# Patient Record
Sex: Female | Born: 1990
Health system: Southern US, Community
[De-identification: ages and names within clinical notes are randomized; demographics above are authoritative.]

## PROBLEM LIST (undated history)

## (undated) DIAGNOSIS — K589 Irritable bowel syndrome without diarrhea: Secondary | ICD-10-CM

## (undated) DIAGNOSIS — D696 Thrombocytopenia, unspecified: Secondary | ICD-10-CM

## (undated) DIAGNOSIS — R221 Localized swelling, mass and lump, neck: Secondary | ICD-10-CM

## (undated) DIAGNOSIS — R0602 Shortness of breath: Secondary | ICD-10-CM

## (undated) DIAGNOSIS — R400 Somnolence: Secondary | ICD-10-CM

## (undated) DIAGNOSIS — M2550 Pain in unspecified joint: Secondary | ICD-10-CM

## (undated) DIAGNOSIS — R569 Unspecified convulsions: Secondary | ICD-10-CM

## (undated) DIAGNOSIS — N912 Amenorrhea, unspecified: Secondary | ICD-10-CM

## (undated) DIAGNOSIS — G40909 Epilepsy, unspecified, not intractable, without status epilepticus: Secondary | ICD-10-CM

## (undated) HISTORY — DX: Localized swelling, mass and lump, neck: R22.1

## (undated) HISTORY — DX: Irritable bowel syndrome, unspecified: K58.9

## (undated) HISTORY — DX: Somnolence: R40.0

## (undated) HISTORY — DX: Pain in unspecified joint: M25.50

## (undated) HISTORY — PX: OTHER SURGICAL HISTORY: SHX169

## (undated) HISTORY — DX: Shortness of breath: R06.02

## (undated) HISTORY — DX: Epilepsy, unspecified, not intractable, without status epilepticus: G40.909

## (undated) HISTORY — DX: Thrombocytopenia, unspecified: D69.6

## (undated) HISTORY — DX: Amenorrhea, unspecified: N91.2

---
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
## 2010-06-06 ENCOUNTER — Emergency Department (HOSPITAL_COMMUNITY)
Admission: EM | Admit: 2010-06-06 | Discharge: 2010-06-06 | Payer: Self-pay | Source: Home / Self Care | Admitting: Emergency Medicine

## 2010-06-08 LAB — COMPREHENSIVE METABOLIC PANEL
Albumin: 3.8 g/dL (ref 3.5–5.2)
BUN: 12 mg/dL (ref 6–23)
CO2: 24 mEq/L (ref 19–32)
Chloride: 110 mEq/L (ref 96–112)
Creatinine, Ser: 0.65 mg/dL (ref 0.4–1.2)
GFR calc non Af Amer: >60 mL/min (ref >60)
Total Bilirubin: 0.5 mg/dL (ref 0.3–1.2)

## 2010-06-08 LAB — DIFFERENTIAL
Basophils Absolute: 0 10*3/uL (ref 0.0–0.1)
Lymphocytes Relative: 13 % (ref 12–46)
Monocytes Relative: 13 % — ABNORMAL HIGH (ref 3–12)
Neutrophils Relative %: 74 % (ref 43–77)

## 2010-06-08 LAB — CBC
Hemoglobin: 13 g/dL (ref 12.0–15.0)
RBC: 4.28 MIL/uL (ref 3.87–5.11)
WBC: 6.9 10*3/uL (ref 4.0–10.5)

## 2010-06-08 LAB — URINALYSIS, ROUTINE W REFLEX MICROSCOPIC
Nitrite: NEGATIVE
Urine Glucose, Fasting: NEGATIVE mg/dL
pH: 5.5 (ref 5.0–8.0)

## 2010-06-08 LAB — URINE MICROSCOPIC-ADD ON

## 2010-10-01 NOTE — Procedures (Signed)
CLINICAL HISTORY:  The patient is a 21 year old with primary generalized  epilepsy with absence seizures. Study is being done to look for the presence  of ongoing ictal activity.   PROCEDURE:  The tracing was carried out on a 32-channel digital Cadwell  recorder reformatted into 16-channel montages with 1 devoted to EKG. The  patient was awake during the recording. The [employer] 10/20 system lead  placement was used.   DESCRIPTION OF FINDINGS:  Dominant frequency is 8 to 9 hertz, 15 to 20  microvolt activity that is well regulated and attenuates partially with eye  opening.   Background activity is a mixture of theta and lower alpha range activity  that is broadly distributed with superimposed, frontally predominant, under  20 microvolt beta range activity.   There was no focal slowing. There was no interictal epileptiform activity in  the form of spikes or sharp waves.   Activating procedures with intermittent photic stimulation and  hyperventilation failed to cause any significant changes.   EKG showed a regular sinus rhythm with a ventricular response of 96 beats  per minute.   IMPRESSION:  Normal waking record.     Will   EAV:[id number]  D:  05/05/2004 18:21:19  T:  05/06/2004 10:34:07  Job #:  [id number]

## 2011-06-09 ENCOUNTER — Ambulatory Visit (INDEPENDENT_AMBULATORY_CARE_PROVIDER_SITE_OTHER): Payer: BC Managed Care – PPO

## 2011-06-09 DIAGNOSIS — R05 Cough: Secondary | ICD-10-CM

## 2011-06-09 DIAGNOSIS — J029 Acute pharyngitis, unspecified: Secondary | ICD-10-CM

## 2011-06-09 DIAGNOSIS — R509 Fever, unspecified: Secondary | ICD-10-CM

## 2011-06-09 DIAGNOSIS — H66009 Acute suppurative otitis media without spontaneous rupture of ear drum, unspecified ear: Secondary | ICD-10-CM

## 2011-06-09 DIAGNOSIS — R059 Cough, unspecified: Secondary | ICD-10-CM

## 2011-06-11 ENCOUNTER — Encounter (HOSPITAL_COMMUNITY): Payer: Self-pay | Admitting: *Deleted

## 2011-06-11 ENCOUNTER — Emergency Department (HOSPITAL_COMMUNITY)
Admission: EM | Admit: 2011-06-11 | Discharge: 2011-06-11 | Disposition: A | Discharge status: Home / Self Care | Payer: BC Managed Care – PPO | Attending: Emergency Medicine | Admitting: Emergency Medicine

## 2011-06-11 DIAGNOSIS — H9209 Otalgia, unspecified ear: Secondary | ICD-10-CM | POA: Insufficient documentation

## 2011-06-11 DIAGNOSIS — R059 Cough, unspecified: Secondary | ICD-10-CM | POA: Insufficient documentation

## 2011-06-11 DIAGNOSIS — R599 Enlarged lymph nodes, unspecified: Secondary | ICD-10-CM | POA: Insufficient documentation

## 2011-06-11 DIAGNOSIS — R05 Cough: Secondary | ICD-10-CM | POA: Insufficient documentation

## 2011-06-11 DIAGNOSIS — J069 Acute upper respiratory infection, unspecified: Secondary | ICD-10-CM | POA: Insufficient documentation

## 2011-06-11 HISTORY — DX: Unspecified convulsions: R56.9

## 2011-06-11 LAB — RAPID STREP SCREEN (MED CTR MEBANE ONLY): Streptococcus, Group A Screen (Direct): NEGATIVE

## 2011-06-11 MED ORDER — TRAMADOL HCL 50 MG PO TABS: 50.0000 mg | ORAL_TABLET | Freq: Four times a day (QID) | ORAL | Status: AC | PRN

## 2011-06-11 MED ORDER — DEXAMETHASONE SODIUM PHOSPHATE 10 MG/ML IJ SOLN
10.0000 mg | Freq: Once | INTRAMUSCULAR | Status: AC
  Administered 2011-06-11: 10 mg via INTRAMUSCULAR
  Filled 2011-06-11: qty 1

## 2011-06-11 MED ORDER — LIDOCAINE VISCOUS 2 % MT SOLN: 20.0000 mL | OROMUCOSAL | Status: AC | PRN

## 2011-06-11 MED ORDER — FLUTICASONE PROPIONATE 50 MCG/ACT NA SUSP: 2.0000 | Freq: Every day | NASAL | Status: DC

## 2011-06-11 MED ORDER — IBUPROFEN 800 MG PO TABS: 800.0000 mg | ORAL_TABLET | Freq: Three times a day (TID) | ORAL | Status: AC

## 2011-06-11 MED ORDER — KETOROLAC TROMETHAMINE 60 MG/2ML IM SOLN
60.0000 mg | Freq: Once | INTRAMUSCULAR | Status: AC
  Administered 2011-06-11: 60 mg via INTRAMUSCULAR
  Filled 2011-06-11: qty 2

## 2011-06-11 NOTE — ED Notes (Signed)
Pt from home c/o bilateral ear pain, started in R ear on Tuesday but began in both ears this morning. Pt treated at Urgent Care Thursday with no improvement, pt also c/o head, chest congestion and headache as well generalized body aches.

## 2011-06-11 NOTE — ED Provider Notes (Signed)
History     CSN: [id number]  Arrival date & time 06/11/11  1338   None    3:04 PM HPI Reports bilateral ear pain for 5 days. States she went to UC 4 days ago and received a Zpack and tessalon. Reports associated symptoms of fever, cough, sore throat, swollen lymph node, hoarse voice, and headaches Patient is a 21 y.o. female presenting with ear pain. The history is provided by the patient and a parent.  Otalgia This is a new problem. The current episode started more than 2 days ago. There is pain in both ears. The problem occurs constantly. The problem has been gradually worsening. The maximum temperature recorded prior to her arrival was 102 to 102.9 F. The fever has been present for 1 to 2 days. The pain is at a severity of 5/10. The pain is moderate. Associated symptoms include headaches, rhinorrhea, sore throat and cough. Pertinent negatives include no ear discharge, no hearing loss, no abdominal pain, no diarrhea, no vomiting and no rash.    Past Medical History  Diagnosis Date  . Seizures     History reviewed. No pertinent past surgical history.  History reviewed. No pertinent family history.  History  Substance Use Topics  . Smoking status: Never Smoker   . Smokeless tobacco: Never Used  . Alcohol Use: No    OB History    Grav Para Term Preterm Abortions TAB SAB Ect Mult Living                  Review of Systems  Constitutional: Positive for fever and fatigue. Negative for chills.  HENT: Positive for ear pain, congestion, sore throat, rhinorrhea and sinus pressure. Negative for hearing loss and ear discharge.   Respiratory: Positive for cough. Negative for shortness of breath and wheezing.   Cardiovascular: Negative for chest pain.  Gastrointestinal: Negative for vomiting, abdominal pain and diarrhea.  Musculoskeletal: Negative for myalgias.  Skin: Negative for rash.  Neurological: Positive for headaches. Negative for dizziness and numbness.  All other systems  reviewed and are negative.    Allergies  Penicillins; Sulfa antibiotics; and Erythromycin  Home Medications   Current Outpatient Rx  Name Route Sig Dispense Refill  . ACETAMINOPHEN 500 MG PO TABS Oral Take 1,000 mg by mouth every 6 (six) hours as needed. For pain    . ANTIPYRINE-BENZOCAINE 5.4-1.4 % OT SOLN Right Ear Place 2-4 drops into the right ear 4 (four) times daily as needed. Ear pain    . AZITHROMYCIN 250 MG PO TABS Oral Take 250-500 mg by mouth daily. Take 2 tablets on day 1, and 1 tablet on days 2-5.    Marland Kitchen BENZONATATE 100 MG PO CAPS Oral Take 100 mg by mouth 3 (three) times daily as needed. For cough    . CLONAZEPAM 0.5 MG PO TABS Oral Take 0.5 mg by mouth at bedtime as needed. For epilepsy    . FOLIC ACID 1 MG PO TABS Oral Take 1 mg by mouth daily.    . IBUPROFEN 200 MG PO TABS Oral Take 600 mg by mouth every 6 (six) hours as needed. For menstrual pain    . LACOSAMIDE 50 MG PO TABS Oral Take 100 mg by mouth 2 (two) times daily.    . TOPIRAMATE 100 MG PO TABS Oral Take 100 mg by mouth 2 (two) times daily.    Marland Kitchen VITAMIN C 500 MG PO TABS Oral Take 500 mg by mouth daily.    Marland Kitchen ZONISAMIDE 25  MG PO CAPS Oral Take 25 mg by mouth 2 (two) times daily.      BP 123/80  Pulse 100  Temp(Src) 99.4 F (37.4 C) (Oral)  Resp 18  Ht 5\' 5"  (1.651 m)  Wt 125 lb (56.7 kg)  BMI 20.80 kg/m2  SpO2 100%  LMP 06/04/2011  Physical Exam  Vitals reviewed. Constitutional: She is oriented to person, place, and time. Vital signs are normal. She appears well-developed and well-nourished.  HENT:  Head: Normocephalic and atraumatic.  Right Ear: Tympanic membrane and external ear normal. Tympanic membrane is not perforated, not erythematous, not retracted and not bulging.  Left Ear: Tympanic membrane and external ear normal. Tympanic membrane is not perforated, not erythematous, not retracted and not bulging.  Nose: Mucosal edema and rhinorrhea present. Right sinus exhibits maxillary sinus tenderness.  Right sinus exhibits no frontal sinus tenderness. Left sinus exhibits maxillary sinus tenderness. Left sinus exhibits no frontal sinus tenderness.  Mouth/Throat: Uvula is midline and mucous membranes are normal. Posterior oropharyngeal erythema present. No oropharyngeal exudate, posterior oropharyngeal edema or tonsillar abscesses.       Mild erythema of bilateral ear canal. But normal TMs  Eyes: Conjunctivae are normal. Pupils are equal, round, and reactive to light.  Neck: Normal range of motion. Neck supple.  Cardiovascular: Normal rate, regular rhythm and normal heart sounds.  Exam reveals no friction rub.   No murmur heard. Pulmonary/Chest: Effort normal and breath sounds normal. She has no wheezes. She has no rhonchi. She has no rales. She exhibits no tenderness.  Musculoskeletal: Normal range of motion.  Lymphadenopathy:    She has cervical adenopathy.  Neurological: She is alert and oriented to person, place, and time. Coordination normal.  Skin: Skin is warm and dry. No rash noted. No erythema. No pallor.    ED Course  Procedures Results for orders placed during the hospital encounter of 06/11/11  RAPID STREP SCREEN      Component Value Range   Streptococcus, Group A Screen (Direct) NEGATIVE  NEGATIVE     MDM  3:52 PM Discussed treatment of viral URI. Advised continuing to take ABx until complete. Will Provide symptomatic relief.         Thomasene Lot, PA-C 06/11/11 1553

## 2011-06-12 NOTE — ED Provider Notes (Signed)
Medical screening examination/treatment/procedure(s) were performed by non-physician practitioner and as supervising physician I was immediately available for consultation/collaboration.  Hezikiah Retzloff, MD 06/12/11 1035 

## 2011-08-18 ENCOUNTER — Emergency Department (HOSPITAL_COMMUNITY)
Admission: EM | Admit: 2011-08-18 | Discharge: 2011-08-18 | Disposition: A | Discharge status: Home / Self Care | Payer: BC Managed Care – PPO | Attending: Emergency Medicine | Admitting: Emergency Medicine

## 2011-08-18 ENCOUNTER — Encounter (HOSPITAL_COMMUNITY): Payer: Self-pay | Admitting: *Deleted

## 2011-08-18 DIAGNOSIS — Z79899 Other long term (current) drug therapy: Secondary | ICD-10-CM | POA: Insufficient documentation

## 2011-08-18 DIAGNOSIS — R569 Unspecified convulsions: Secondary | ICD-10-CM

## 2011-08-18 DIAGNOSIS — F29 Unspecified psychosis not due to a substance or known physiological condition: Secondary | ICD-10-CM | POA: Insufficient documentation

## 2011-08-18 DIAGNOSIS — G40909 Epilepsy, unspecified, not intractable, without status epilepticus: Secondary | ICD-10-CM | POA: Insufficient documentation

## 2011-08-18 DIAGNOSIS — R110 Nausea: Secondary | ICD-10-CM | POA: Insufficient documentation

## 2011-08-18 DIAGNOSIS — R51 Headache: Secondary | ICD-10-CM | POA: Insufficient documentation

## 2011-08-18 LAB — URINE MICROSCOPIC-ADD ON

## 2011-08-18 LAB — BASIC METABOLIC PANEL
BUN: 13 mg/dL (ref 6–23)
CO2: 23 mEq/L (ref 19–32)
Chloride: 106 mEq/L (ref 96–112)
Creatinine, Ser: 0.68 mg/dL (ref 0.50–1.10)

## 2011-08-18 LAB — CBC
HCT: 40 % (ref 36.0–46.0)
MCH: 29.9 pg (ref 26.0–34.0)
MCV: 88.5 fL (ref 78.0–100.0)
Platelets: 205 10*3/uL (ref 150–400)
RBC: 4.52 MIL/uL (ref 3.87–5.11)
RDW: 12.7 % (ref 11.5–15.5)

## 2011-08-18 LAB — DIFFERENTIAL
Lymphocytes Relative: 16 % (ref 12–46)
Lymphs Abs: 0.7 10*3/uL (ref 0.7–4.0)
Monocytes Absolute: 0.5 10*3/uL (ref 0.1–1.0)
Monocytes Relative: 10 % (ref 3–12)
Neutro Abs: 3.6 10*3/uL (ref 1.7–7.7)

## 2011-08-18 LAB — URINALYSIS, ROUTINE W REFLEX MICROSCOPIC
Bilirubin Urine: NEGATIVE
Glucose, UA: NEGATIVE mg/dL
Ketones, ur: NEGATIVE mg/dL
pH: 5 (ref 5.0–8.0)

## 2011-08-18 NOTE — Discharge Instructions (Signed)
.Driving and Equipment Restrictions Some medical problems make it dangerous to drive, ride a bike, or use machines. Some of these problems are:  A hard blow to the head (concussion).   Passing out (fainting).   Twitching and shaking (seizures).   Low blood sugar.   Taking medicine to help you relax (sedatives).   Taking pain medicines.   Wearing an eye patch.   Wearing splints. This can make it hard to use parts of your body that you need to drive safely.  HOME CARE   Do not drive until your doctor says it is okay.   Do not use machines until your doctor says it is okay.  You may need a form signed by your doctor (medical release) before you can drive again. You may also need this form before you do other tasks where you need to be fully alert. MAKE SURE YOU:  Understand these instructions.   Will watch your condition.   Will get help right away if you are not doing well or get worse.  Document Released: 06/09/2004 Document Revised: 04/21/2011 Document Reviewed: 09/09/2009 Exit[employer] Patient Information 2012 [employer].Epilepsy A seizure (convulsion) is a sudden change in brain function that causes a change in behavior, muscle activity, or ability to remain awake and alert. If a person has recurring seizures, this is called epilepsy. CAUSES  Epilepsy is a disorder with many possible causes. Anything that disturbs the normal pattern of brain cell activity can lead to seizures. Seizure can be caused from illness to brain damage to abnormal brain development. Epilepsy may develop because of:  An abnormality in brain wiring.   An imbalance of nerve signaling chemicals (neurotransmitters).   Some combination of these factors.  Scientists are learning an increasing amount about genetic causes of seizures. SYMPTOMS  The symptoms of a seizure can vary greatly from one person to another. These may include:  An aura, or warning that tells a person they are about to have a  seizure.   Abnormal sensations, such as abnormal smell or seeing flashing lights.   Sudden, general body stiffness.   Rhythmic jerking of the face, arm, or leg - on one or both sides.   Sudden change in consciousness.   The person may appear to be awake but not responding.   They may appear to be asleep but cannot be awakened.   Grimacing, chewing, lip smacking, or drooling.   Often there is a period of sleepiness after a seizure.  DIAGNOSIS  The description you give to your caregiver about what you experienced will help them understand your problems. Equally important is the description by any witnesses to your seizure. A physical exam, including a detailed neurological exam, is necessary. An EEG (electroencephalogram) is a painless test of your brain waves. In this test a diagram is created of your brain waves. These diagrams can be interpreted by a specialist. Pictures of your brain are usually taken with:  An MRI.   A CT scan.  Lab tests may be done to look for:  Signs of infection.   Abnormal blood chemistry.  PREVENTION  There is no way to prevent the development of epilepsy. If you have seizures that are typically triggered by an event (such as flashing lights), try to avoid the trigger. This can help you avoid a seizure.  PROGNOSIS  Most people with epilepsy lead outwardly normal lives. While epilepsy cannot currently be cured, for some people it does eventually go away. Most seizures do not   cause brain damage. It is not uncommon for people with epilepsy, especially children, to develop behavioral and emotional problems. These problems are sometimes the consequence of medicine for seizures or social stress. For some people with epilepsy, the risk of seizures restricts their independence and recreational activities. For example, some states refuse drivers licenses to people with epilepsy. Most women with epilepsy can become pregnant. They should discuss their epilepsy and the  medicine they are taking with their caregivers. Women with epilepsy have a 90 percent or better chance of having a normal, healthy baby. RISKS AND COMPLICATIONS  People with epilepsy are at increased risk of falls, accidents, and injuries. People with epilepsy are at special risk for two life-threatening conditions. These are status epilepticus and sudden unexplained death (extremely rare). Status epilepticus is a long lasting, continuous seizure that is a medical emergency. TREATMENT  Once epilepsy is diagnosed, it is important to begin treatment as soon as possible. For about 80 percent of those diagnosed with epilepsy, seizures can be controlled with modern medicines and surgical techniques. Some antiepileptic drugs can interfere with the effectiveness of oral contraceptives. In 1997, the FDA approved a pacemaker for the brain the (vagus nerve stimulator). This stimulator can be used for people with seizures that are not well-controlled by medicine. Studies have shown that in some cases, children may experience fewer seizures if they maintain a strict diet. The strict diet is called the ketogenic diet. This diet is rich in fats and low in carbohydrates. HOME CARE INSTRUCTIONS   Your caregiver will make recommendations about driving and safety in normal activities. Follow these carefully.   Take any medicine prescribed exactly as directed.   Do any blood tests requested to monitor the levels of your medicine.   The people you live and work with should know that you are prone to seizures. They should receive instructions on how to help you. In general, a witness to a seizure should:   Cushion your head and body.   Turn you on your side.   Avoid unnecessarily restraining you.   Not place anything inside your mouth.   Call for local emergency medical help if there is any question about what has occurred.   Keep a seizure diary. Record what you recall about any seizure, especially any possible  trigger.   If your caregiver has given you a follow-up appointment, it is very important to keep that appointment. Not keeping the appointment could result in permanent injury and disability. If there is any problem keeping the appointment, you must call back to this facility for assistance.  SEEK MEDICAL CARE IF:   You develop signs of infection or other illness. This might increase the risk of a seizure.   You seem to be having more frequent seizures.   Your seizure pattern is changing.  SEEK IMMEDIATE MEDICAL CARE IF:   A seizure does not stop after a few moments.   A seizure causes any difficulty in breathing.   A seizure results in a very severe headache.   A seizure leaves you with the inability to speak or use a part of your body.  MAKE SURE YOU:   Understand these instructions.   Will watch your condition.   Will get help right away if you are not doing well or get worse.  Document Released: 05/02/2005 Document Revised: 04/21/2011 Document Reviewed: 12/07/2007 Exit[employer] Patient Information 2012 [employer]. 

## 2011-08-18 NOTE — ED Notes (Signed)
PA at bedside.

## 2011-08-18 NOTE — ED Provider Notes (Signed)
History     CSN: [id number]  Arrival date & time 08/18/11  1034   First MD Initiated Contact with Patient 08/18/11 1103      Chief Complaint  Patient presents with  . Seizures    (Consider location/radiation/quality/duration/timing/severity/associated sxs/prior treatment) HPI Comments: Patient with history seizures presents emergency Department with chief complaint of unwitnessed seizure.  Episode occurred around 1030 today.  Patient fell her arm jerking and called her mother while at work and went to sit in the break room.  When mother arrived to her daughter's work she states that she found her daughter lying on the floor seizing.  The patient states she woke up in route in the EMS truck.  Patient was disoriented with nausea and headache, but fully oriented with no complaints on arrival to the emergency department.  Patients last tonic-clonic seizure was in January due to stress.  Patient has a history of suffering from absence seizures with the most recent occurring about 3 months ago.  Patient denies decrease in sleep, urinary symptoms including frequency, dysuria, or urgency, cough, fever, night sweats, chills, shortness of breath, illicit drug use, abdominal pain, or headaches. Pts neurologist is Dr. Darvin Neighbours  Patient is a 21 y.o. female presenting with seizures.  Seizures  Pertinent negatives include no confusion, no headaches, no visual disturbance, no chest pain, no nausea, no vomiting and no diarrhea.    Past Medical History  Diagnosis Date  . Seizures     nx of absent and grand mal    History reviewed. No pertinent past surgical history.  History reviewed. No pertinent family history.  History  Substance Use Topics  . Smoking status: Never Smoker   . Smokeless tobacco: Never Used  . Alcohol Use: No    OB History    Grav Para Term Preterm Abortions TAB SAB Ect Mult Living                  Review of Systems  Constitutional: Negative for fever, chills and appetite  change.  HENT: Negative for congestion, neck pain, neck stiffness and tinnitus.   Eyes: Negative for visual disturbance.  Respiratory: Negative for shortness of breath.   Cardiovascular: Negative for chest pain and leg swelling.  Gastrointestinal: Negative for nausea, vomiting, abdominal pain and diarrhea.  Genitourinary: Negative for dysuria, urgency and frequency.  Neurological: Positive for seizures. Negative for dizziness, syncope, weakness, light-headedness, numbness and headaches.  Psychiatric/Behavioral: Negative for confusion.  All other systems reviewed and are negative.    Allergies  Penicillins; Sulfa antibiotics; and Erythromycin  Home Medications   Current Outpatient Rx  Name Route Sig Dispense Refill  . CLONAZEPAM 0.5 MG PO TABS Oral Take 0.5 mg by mouth at bedtime as needed. For epilepsy    . FOLIC ACID 1 MG PO TABS Oral Take 1 mg by mouth daily.    . IBUPROFEN 200 MG PO TABS Oral Take 600 mg by mouth every 6 (six) hours as needed. For menstrual pain    . LACOSAMIDE 50 MG PO TABS Oral Take 100 mg by mouth 2 (two) times daily.    . ADULT MULTIVITAMIN W/MINERALS CH Oral Take 1 tablet by mouth daily.    . TOPIRAMATE 100 MG PO TABS Oral Take 100 mg by mouth 2 (two) times daily.    Marland Kitchen VITAMIN C 500 MG PO TABS Oral Take 500 mg by mouth daily.    Marland Kitchen ZONISAMIDE 25 MG PO CAPS Oral Take 25 mg by mouth 2 (two) times  daily.    Marland Kitchen FLUTICASONE PROPIONATE 50 MCG/ACT NA SUSP Nasal Place 2 sprays into the nose daily. 16 g 0    BP 112/94  Pulse 97  Temp(Src) 98.4 F (36.9 C) (Oral)  Resp 16  SpO2 100%  Physical Exam  Nursing note and vitals reviewed. Constitutional: She is oriented to person, place, and time. She appears well-developed and well-nourished. No distress.  HENT:  Head: Normocephalic and atraumatic.  Eyes: Conjunctivae and EOM are normal. Pupils are equal, round, and reactive to light. No scleral icterus.  Neck: Normal range of motion and full passive range of motion  without pain. Neck supple. No JVD present. Carotid bruit is not present. No rigidity. No Brudzinski's sign noted.  Cardiovascular: Normal rate, regular rhythm, normal heart sounds and intact distal pulses.   Pulmonary/Chest: Effort normal and breath sounds normal. No respiratory distress. She has no wheezes. She has no rales.  Abdominal: Soft. Bowel sounds are normal. There is no tenderness.  Musculoskeletal: Normal range of motion.  Lymphadenopathy:    She has no cervical adenopathy.  Neurological: She is alert and oriented to person, place, and time. She has normal strength. No cranial nerve deficit or sensory deficit. She displays a negative Romberg sign. Coordination and gait normal. GCS eye subscore is 4. GCS verbal subscore is 5. GCS motor subscore is 6.       A&O x3.  Able to follow commands. PERRL, EOMs, no vertical or bidirectional nystagmus. Shoulder shrug, facial muscles, tongue protrusion and swallow intact.  Motor strength 5/5 bilaterally including grip strength, triceps, hamstrings and ankle dorsiflexion.  Normal patellar DTRs.  Light touch intact in all 4 distal limbs.  Intact finger to nose, shin to heel and rapid alternating movements. No ataxia or dysequilibrium.   Skin: Skin is warm and dry. No rash noted. She is not diaphoretic.  Psychiatric: She has a normal mood and affect. Her behavior is normal.    ED Course  Procedures (including critical care time)  Labs Reviewed  BASIC METABOLIC PANEL - Abnormal; Notable for the following:    Glucose, Bld 110 (*)    All other components within normal limits  URINALYSIS, ROUTINE W REFLEX MICROSCOPIC - Abnormal; Notable for the following:    APPearance CLOUDY (*)    Leukocytes, UA TRACE (*)    All other components within normal limits  URINE MICROSCOPIC-ADD ON - Abnormal; Notable for the following:    Crystals URIC ACID CRYSTALS (*)    All other components within normal limits  GLUCOSE, CAPILLARY  CBC  DIFFERENTIAL  POCT  PREGNANCY, URINE   No results found.   No diagnosis found.    MDM  Seizure   Pt w history of seizure presents to ED after having an un witnessed seizure likely d/t to increased stress levels. No focal neuro deficits on exam, imaging not indicated. LCAB, VSS, no evidence of infection. Labs reviewed. Pt has follow up scheduled with her neurologist this week.           Jaci Carrel, [place] 08/18/11 1258

## 2011-08-18 NOTE — ED Notes (Signed)
Pt alert and oriented x4. Respirations even and unlabored, bilateral symmetrical rise and fall of chest. Skin warm and dry. In no acute distress. Denies needs.   

## 2011-08-18 NOTE — ED Notes (Signed)
Per ems pt was at work and had an unwittnessed seizure. Unsure if pt fell to floor so on long board, c collar and headblocks. Pt felt seizure coming on and went into break room. When found pt was on the floor. Last seizure was jan 2012, mother reports seizures are usually triggered by stress. When ems arrived pt was slightly disoriented, but fully alert and oriented x4 by time pt arrived at hospital.

## 2011-08-18 NOTE — ED Notes (Signed)
ZOX:[id number]<AV> Expected date:08/18/11<BR> Expected time:<BR> Means of arrival:Ambulance<BR> Comments:<BR> EMS 5 Seizure pt

## 2011-08-18 NOTE — ED Provider Notes (Signed)
History/physical exam/procedure(s) were performed by non-physician practitioner and as supervising physician I was immediately available for consultation/collaboration. I have reviewed all notes and am in agreement with care and plan.   Hilario Quarry, MD 08/18/11 [phone]

## 2011-08-18 NOTE — ED Notes (Signed)
Pt CBG 84. Pt was provided with orange juice, c collar removed after PA assessed pt

## 2012-01-11 DIAGNOSIS — N912 Amenorrhea, unspecified: Secondary | ICD-10-CM

## 2012-01-11 DIAGNOSIS — D696 Thrombocytopenia, unspecified: Secondary | ICD-10-CM

## 2012-01-11 DIAGNOSIS — R400 Somnolence: Secondary | ICD-10-CM

## 2012-01-11 HISTORY — DX: Amenorrhea, unspecified: N91.2

## 2012-01-11 HISTORY — DX: Thrombocytopenia, unspecified: D69.6

## 2012-01-11 HISTORY — DX: Somnolence: R40.0

## 2012-04-28 ENCOUNTER — Encounter: Payer: Self-pay | Admitting: Neurology

## 2012-08-03 ENCOUNTER — Other Ambulatory Visit: Payer: Self-pay

## 2012-08-03 MED ORDER — ZONISAMIDE 25 MG PO CAPS: 25.0000 mg | ORAL_CAPSULE | Freq: Two times a day (BID) | ORAL | Status: DC

## 2012-08-03 NOTE — Telephone Encounter (Signed)
Mom requesting 25mg  refill

## 2012-08-06 ENCOUNTER — Telehealth: Payer: Self-pay

## 2012-08-06 MED ORDER — ZONISAMIDE 25 MG PO CAPS: 50.0000 mg | ORAL_CAPSULE | Freq: Two times a day (BID) | ORAL | Status: DC

## 2012-08-06 NOTE — Telephone Encounter (Signed)
I will call in a prescription for the Zonegran, taking 2 of the 25 mg tablets twice daily.

## 2012-08-06 NOTE — Telephone Encounter (Signed)
Mom called and requested that we send a new rx to the pharmacy for Zonegran 25mg  two bid.  States they have the rx for 100mg , which is too strong.  The current 25mg  that is on Epic is only for one bid.  Says she has spoken to Dr Vickey Huger about this before and she was okay with patient changing doses, but never got a rx for it.  Mom is aware Dr Vickey Huger is out of the office.  Forwarding request to Dr Vickie Epley.

## 2012-08-22 ENCOUNTER — Ambulatory Visit (INDEPENDENT_AMBULATORY_CARE_PROVIDER_SITE_OTHER): Payer: BC Managed Care – PPO | Admitting: Neurology

## 2012-08-22 ENCOUNTER — Encounter: Payer: Self-pay | Admitting: Neurology

## 2012-08-22 ENCOUNTER — Ambulatory Visit: Payer: Self-pay | Admitting: Neurology

## 2012-08-22 VITALS — BP 105/70 | HR 76 | Temp 97.6°F | Ht 64.5 in | Wt 124.0 lb

## 2012-08-22 DIAGNOSIS — G40919 Epilepsy, unspecified, intractable, without status epilepticus: Secondary | ICD-10-CM

## 2012-08-22 NOTE — Progress Notes (Addendum)
I was unfortunately delayed in seeing this patient due to a scheduling conflict and the patient had to leave before I could begin my encounter. The patient has been originally referred by Dr. Tenny Craw.

## 2012-08-24 ENCOUNTER — Ambulatory Visit: Payer: Self-pay | Admitting: Neurology

## 2012-09-21 ENCOUNTER — Encounter: Payer: Self-pay | Admitting: Neurology

## 2012-09-21 ENCOUNTER — Ambulatory Visit (INDEPENDENT_AMBULATORY_CARE_PROVIDER_SITE_OTHER): Payer: BC Managed Care – PPO | Admitting: Neurology

## 2012-09-21 VITALS — BP 106/70 | HR 73 | Temp 98.1°F | Ht 73.0 in | Wt 126.0 lb

## 2012-09-21 DIAGNOSIS — G40A19 Absence epileptic syndrome, intractable, without status epilepticus: Secondary | ICD-10-CM

## 2012-09-21 DIAGNOSIS — M25519 Pain in unspecified shoulder: Secondary | ICD-10-CM

## 2012-09-21 DIAGNOSIS — M25511 Pain in right shoulder: Secondary | ICD-10-CM

## 2012-09-21 DIAGNOSIS — G40319 Generalized idiopathic epilepsy and epileptic syndromes, intractable, without status epilepticus: Secondary | ICD-10-CM

## 2012-09-21 MED ORDER — LACOSAMIDE 50 MG PO TABS: 100.0000 mg | ORAL_TABLET | Freq: Two times a day (BID) | ORAL | Status: DC

## 2012-09-21 MED ORDER — ZONISAMIDE 25 MG PO CAPS: 50.0000 mg | ORAL_CAPSULE | Freq: Two times a day (BID) | ORAL | Status: DC

## 2012-09-21 MED ORDER — TOPIRAMATE 100 MG PO TABS: 100.0000 mg | ORAL_TABLET | Freq: Two times a day (BID) | ORAL | Status: DC

## 2012-09-21 MED ORDER — CLONAZEPAM 0.5 MG PO TABS: 0.2500 mg | ORAL_TABLET | Freq: Every day | ORAL | Status: DC

## 2012-09-21 NOTE — Addendum Note (Signed)
Addended by: Melvyn Novas on: 09/21/2012 09:33 AM   Modules accepted: Orders

## 2012-09-21 NOTE — Progress Notes (Signed)
[facility]  Provider:  Dr Vickey Huger Referring Provider: Primary Care Physician:  Dr Doreatha Martin, [facility]  Chief Complaint  Patient presents with  . Follow-up    epilepsy, rm 10    HPI:  Jennifer Luna is a 22 y.o. female here for a follow up. The patient has had a long-standing history of epilepsy, and was followed from each tool until 2006 at [facility] by Dr. Kendell Bane and Dr. Doreatha Martin. The patient has reportedly Petit mild absence spells but also secondary generalized seizures. At the time of her referral in 2007 sleep studies, EEG and EMG reports were included multiple medications were listed as tried for example 100 mg twice a day of Topamax, clonazepam up to a half milligram at night, then perhaps was added in January and slowly increased to 100 mg twice a day by mouth. In the past she had tried Zonegran, phenobarbital, Keppra, Dilantin, Depakote, Tegretol and Diamox. Jennifer Luna has had significant personality changes on some of his medications as well as toxicity effects such as becoming slurred with her speech or having work finding difficulties on Topamax.   Sleep and appetite have been unchanged. The patient is reporting that during one of her last seizures with secondary generalization she fell from a chair in April 2013, and may have injured her right shoulder which hurts her.  she also has a report of the left hand having tingling dysesthesias and these are followed by  the ring finger and pinky becoming spastically extended and cramping together, she also has this tingling radiating up to mid antebrachium, not reaching the elbow, leading to the assumption that the ulnar nerve is involved.  The topiramate side effects have caused her to be forgetful as well as burdened by work finding difficulties, but the medication itself has been highly effective for her in combination with vimpat. Jennifer Luna is seizure disorder of causing her to have a high  fall risk, the risk of loss of  orientation and has made her unemployable worthy operation of machinery or a car would be required. Jennifer Luna considers going back to college to become a physical therapist or looking for similar future career. The patient has a failed for Social Security disability and was denied. She had previously undergone a vocational rehabilitation and clearly qualified for these benefits as well as in her  medical records indicated for the last 10 years.   Review of Systems: Out of a complete 14 system review, the patient complains of only the following symptoms, and all other reviewed systems are negative.   History   Social History  . Marital Status: Single    Spouse Name: N/A    Number of Children: N/A  . Years of Education: N/A   Occupational History  . Not on file.   Social History Main Topics  . Smoking status: Never Smoker   . Smokeless tobacco: Never Used  . Alcohol Use: No  . Drug Use: No  . Sexually Active: Not on file   Other Topics Concern  . Not on file   Social History Narrative  . No narrative on file    Family History  Problem Relation Age of Onset  . Hypothyroidism Mother   . Arthritis Mother   . Hypertension Father   . Diabetes Father   . Cancer Maternal Grandmother   . Heart disease Maternal Grandfather   . Heart disease Paternal Grandmother   . Lupus Maternal Aunt   . Seizures Paternal Uncle  as a child    Past Medical History  Diagnosis Date  . Seizures     nx of absent and grand mal  . Thrombocytopenia 01/11/12    office notes from last visit, with zarontin and depakote  . Sleepiness 01/11/2012    notes from office visit  . Amenorrhea 01/11/2012    notes from office visit, PERIMENTRUAL PAINS  . Epilepsy     with absence seizures, abnormal EEG  . Sleepiness     DAYTIME    History reviewed. No pertinent past surgical history.  Current Outpatient Prescriptions  Medication Sig Dispense Refill  . clonazePAM  (KLONOPIN) 0.5 MG tablet Take 0.25 mg by mouth at bedtime. For epilepsy      . folic acid (FOLVITE) 1 MG tablet Take 1 mg by mouth daily.      Marland Kitchen ibuprofen (ADVIL,MOTRIN) 200 MG tablet Take 600 mg by mouth every 6 (six) hours as needed. For menstrual pain      . lacosamide (VIMPAT) 50 MG TABS Take 100 mg by mouth 2 (two) times daily.      . Multiple Vitamin (MULITIVITAMIN WITH MINERALS) TABS Take 1 tablet by mouth daily.      Marland Kitchen topiramate (TOPAMAX) 100 MG tablet Take 100 mg by mouth 2 (two) times daily.      Jennifer Luna FE 0.4-35 MG-MCG tablet       . zonisamide (ZONEGRAN) 25 MG capsule Take 2 capsules (50 mg total) by mouth 2 (two) times daily.  360 capsule  1   No current facility-administered medications for this visit.    Allergies as of 09/21/2012 - Review Complete 09/21/2012  Allergen Reaction Noted  . Penicillins Rash 06/11/2011  . Sulfa antibiotics Rash 06/11/2011  . Erythromycin Rash 06/11/2011    Vitals: BP 106/70  Pulse 73  Temp(Src) 98.1 F (36.7 C)  Ht 6\' 1"  (1.854 m)  Wt 126 lb (57.153 kg)  BMI 16.63 kg/m2 Last Weight:  Wt Readings from Last 1 Encounters:  09/21/12 126 lb (57.153 kg)   Last Height:   Ht Readings from Last 1 Encounters:  09/21/12 6\' 1"  (1.854 m)   Vision Screening: Vitals  Physical exam:  General: The patient is awake, alert and appears not in acute distress. The patient is well groomed. Head: Normocephalic, atraumatic. Neck is supple. Mallampati1, . Cardiovascular:  Regular rate and rhythm  without  murmurs or carotid bruit, and without distended neck veins. Respiratory: Lungs are clear to auscultation. Skin:  Without evidence of edema, or rash Trunk: BMI is normal .  Neurologic exam : The patient is awake and alert, oriented to place and time.  Memory subjective described as impaired - . There is a normal attention span & concentration ability. Speech is fluent without dysarthria, dysphonia , but with  aphasia. Mood and affect are  appropriate.  Cranial nerves: Pupils are equal and briskly reactive to light. Funduscopic exam without  evidence of pallor or edema. Extraocular movements  in vertical and horizontal planes intact and with an endpoint  nystagmus. Visual fields by finger perimetry are intact. Hearing to finger rub intact.  Facial sensation intact to fine touch. Facial motor strength is symmetric and tongue and uvula move midline.  Motor exam:   Normal tone and normal muscle bulk and symmetric normal strength in all extremities. Grip strength is normal.   Sensory:  Fine touch, pinprick and vibration were tested in all extremities. Proprioception is tested in the upper extremities only. This was  normal.  Coordination: Rapid alternating movements in the fingers/hands is tested and normal. Finger-to-nose maneuver tested and normal without evidence of ataxia, dysmetria or tremor.  Gait and station: Patient walks without assistive device and is able unassisted  to climb up to the exam table. Strength within normal limits. Stance is stable and normal. Steps are unfragmented.   Deep tendon reflexes: in the  upper and lower extremities are symmetric and intact. .   Assessment:  After physical and neurologic examination, review of laboratory studies, imaging, neurophysiology testing and pre-existing records, assessment will be reviewed on the problem list.  Plan:  Treatment plan and additional workup will be reviewed under Problem List.   Since the patient's last generalized seizure and is not 12 months past, she is by [facility] law allowed to resume driving in a residential area in daytime. Current regimen consists of Zonegran 100 mg caps  1 at bedtime, then vimpat 100 mg in the morning 100 mg at night,  folic acids, multivitamins, clonazepam 0.5 mg a half tablet at bedtime,  topiramate 100 mg tablets one by mouth morning and evening.

## 2012-09-24 ENCOUNTER — Ambulatory Visit (INDEPENDENT_AMBULATORY_CARE_PROVIDER_SITE_OTHER): Payer: BC Managed Care – PPO

## 2012-09-24 DIAGNOSIS — G40319 Generalized idiopathic epilepsy and epileptic syndromes, intractable, without status epilepticus: Secondary | ICD-10-CM

## 2012-09-24 DIAGNOSIS — G40A19 Absence epileptic syndrome, intractable, without status epilepticus: Secondary | ICD-10-CM

## 2012-09-24 NOTE — Progress Notes (Signed)
[facility]  EEG (ELECTROENCEPHALOGRAM) REPORT   STUDY DATE: 09-24-12  PATIENT NAME:  Jennifer Luna DOB:  [birth year]   MRN: [id number]  ORDERING CLINICIAN: Melvyn Novas, MD   TECHNOLOGIST:  Thornton DalesYetta Barre.  TECHNIQUE: Electroencephalogram was recorded utilizing standard 10-20 system of lead placement and reformatted into average and bipolar montages.  RECORDING TIME: 30.5 minutes  ACTIVATION:  HV and strobe lights.   CLINICAL INFORMATION: Patient with history of intractable absence seizures. Last generalized seizure 12 month ago.   FINDINGS: Background rhythms of 12 hertz .generalized  4 hertz spike and wave discharges  With 1-3 seconds in duration. More frequent with strobe light provokation.   IMPRESSION: abnormal EEG . None of these electrographic seizures let to a generalization.    Melvyn Novas , MD 09-24-12

## 2012-11-12 ENCOUNTER — Ambulatory Visit: Payer: BC Managed Care – PPO | Attending: Neurology | Admitting: Physical Therapy

## 2012-11-12 DIAGNOSIS — IMO0001 Reserved for inherently not codable concepts without codable children: Secondary | ICD-10-CM | POA: Insufficient documentation

## 2012-11-12 DIAGNOSIS — R293 Abnormal posture: Secondary | ICD-10-CM | POA: Insufficient documentation

## 2012-11-12 DIAGNOSIS — M25519 Pain in unspecified shoulder: Secondary | ICD-10-CM | POA: Insufficient documentation

## 2012-11-19 ENCOUNTER — Ambulatory Visit: Payer: BC Managed Care – PPO | Attending: Neurology | Admitting: Physical Therapy

## 2012-11-19 DIAGNOSIS — R293 Abnormal posture: Secondary | ICD-10-CM | POA: Insufficient documentation

## 2012-11-19 DIAGNOSIS — IMO0001 Reserved for inherently not codable concepts without codable children: Secondary | ICD-10-CM | POA: Insufficient documentation

## 2012-11-19 DIAGNOSIS — M25519 Pain in unspecified shoulder: Secondary | ICD-10-CM | POA: Insufficient documentation

## 2012-11-23 ENCOUNTER — Ambulatory Visit: Payer: BC Managed Care – PPO

## 2012-11-26 ENCOUNTER — Ambulatory Visit: Payer: BC Managed Care – PPO | Admitting: Physical Therapy

## 2012-11-30 ENCOUNTER — Ambulatory Visit: Payer: BC Managed Care – PPO

## 2012-12-04 ENCOUNTER — Encounter: Payer: BC Managed Care – PPO | Admitting: Physical Therapy

## 2012-12-06 ENCOUNTER — Encounter: Payer: BC Managed Care – PPO | Admitting: Physical Therapy

## 2012-12-10 ENCOUNTER — Ambulatory Visit: Payer: BC Managed Care – PPO | Admitting: Physical Therapy

## 2012-12-11 ENCOUNTER — Ambulatory Visit: Payer: BC Managed Care – PPO | Admitting: Physical Therapy

## 2012-12-20 ENCOUNTER — Ambulatory Visit: Payer: BC Managed Care – PPO | Attending: Neurology | Admitting: Physical Therapy

## 2012-12-20 DIAGNOSIS — IMO0001 Reserved for inherently not codable concepts without codable children: Secondary | ICD-10-CM | POA: Insufficient documentation

## 2012-12-20 DIAGNOSIS — R293 Abnormal posture: Secondary | ICD-10-CM | POA: Insufficient documentation

## 2012-12-20 DIAGNOSIS — M25519 Pain in unspecified shoulder: Secondary | ICD-10-CM | POA: Insufficient documentation

## 2013-01-04 ENCOUNTER — Ambulatory Visit: Payer: BC Managed Care – PPO | Admitting: Internal Medicine

## 2013-01-04 VITALS — BP 102/60 | HR 58 | Temp 98.8°F | Resp 18 | Ht 64.0 in | Wt 126.0 lb

## 2013-01-04 DIAGNOSIS — R5381 Other malaise: Secondary | ICD-10-CM

## 2013-01-04 DIAGNOSIS — R51 Headache: Secondary | ICD-10-CM

## 2013-01-04 DIAGNOSIS — R05 Cough: Secondary | ICD-10-CM

## 2013-01-04 DIAGNOSIS — R059 Cough, unspecified: Secondary | ICD-10-CM

## 2013-01-04 DIAGNOSIS — F5000 Anorexia nervosa, unspecified: Secondary | ICD-10-CM

## 2013-01-04 DIAGNOSIS — R109 Unspecified abdominal pain: Secondary | ICD-10-CM

## 2013-01-04 LAB — POCT UA - MICROSCOPIC ONLY: Casts, Ur, LPF, POC: NEGATIVE

## 2013-01-04 LAB — POCT URINALYSIS DIPSTICK
Bilirubin, UA: NEGATIVE
Blood, UA: NEGATIVE
Glucose, UA: NEGATIVE
Ketones, UA: NEGATIVE
Spec Grav, UA: 1.025

## 2013-01-04 LAB — POCT CBC
Granulocyte percent: 44.4 %G (ref 37–80)
Hemoglobin: 14.2 g/dL (ref 12.2–16.2)
MCH, POC: 29.9 pg (ref 27–31.2)
MPV: 11.2 fL (ref 0–99.8)
POC MID %: 6.2 %M (ref 0–12)
RBC: 4.75 M/uL (ref 4.04–5.48)
WBC: 3.9 10*3/uL — AB (ref 4.6–10.2)

## 2013-01-04 MED ORDER — DOXYCYCLINE HYCLATE 100 MG PO TABS: 100.0000 mg | ORAL_TABLET | Freq: Two times a day (BID) | ORAL | Status: DC

## 2013-01-04 NOTE — Patient Instructions (Addendum)
[place] Spotted Fever [place] Spotted Fever (RMSF) is the oldest known tick-borne disease of people in the [place]. This disease was named because it was first described among people in the [place] area who had an illness characterized by a rash with red-purple-black spots. This disease is caused by a rickettsia (Rickettsia rickettsii), a bacteria carried by the tick. The [place] wood tick and the [place] dog tick, acquire and transmit the RMSF bacteria (pictures NOT actual size). When a larval, nymphal or adult tick feeds on an infected rodent or larger animal, the tick can become infected. Infected adult ticks then feed on people who may then get RMSF. The tick transmits the disease to humans during a prolonged period of feeding that lasts many hours, days or even a couple weeks. The bite is painless and frequently goes unnoticed. An infected female tick may also pass the rickettsial bacteria to her eggs that then may mature to be infected adult ticks. The rickettsia that causes RMSF can also get into a person's body through damaged skin. A tick bite is not necessary. People can get RMSF if they crush a tick and get it's blood or body fluids on their skin through a small cut or sore.  DIAGNOSIS Diagnosis is made by laboratory tests.  TREATMENT Treatment is with antibiotics (medications that kill rickettsia and other bacteria). Immediate treatment usually prevents death. GEOGRAPHIC RANGE This disease was reported only in the [place] until 1931. RMSF has more recently been described among individuals in all states except [place], [place] and [place]. The highest reported incidences of RMSF now occur among residents of [place], [place], [place] and the [place]. TIME OF YEAR  Most cases are diagnosed during late spring and summer when ticks are most active. However, especially in the warmer southern states, a few cases occur during the winter. SYMPTOMS    Symptoms of RMSF begin from 2 to 14 days after a tick bite. The most common early symptoms are fever, muscle aches and headache followed by nausea (feeling sick to your stomach) or vomiting.  The RMSF rash is typically delayed until 3 or more days after symptom onset, and eventually develops in 9 of 10 infected patients by the 5th day of illness. If the disease is not treated it can cause death. If you get a fever, headache, muscle aches, rash, nausea or vomiting within 2 weeks of a possible tick bite or exposure you should see your caregiver immediately. PREVENTION Ticks prefer to hide in shady, moist ground litter. They can often be found above the ground clinging to tall grass, brush, shrubs and low tree branches. They also inhabit lawns and gardens, especially at the edges of woodlands and around old stone walls. Within the areas where ticks generally live, no naturally vegetated area can be considered completely free of infected ticks. The best precaution against RMSF is to avoid contact with soil, leaf litter and vegetation as much as possible in tick infested areas. For those who enjoy gardening or walking in their yards, clear brush and mow tall grass around houses and at the edges of gardens. This may help reduce the tick population in the immediate area. Applications of chemical insecticides by a licensed professional in the spring (late May) and Fall (September) will also control ticks, especially in heavily infested areas. Treatment will never get rid of all the ticks. Getting rid of small animal populations that host ticks will also decrease the tick population. When working in the garden,   pruning shrubs, or handling soil and vegetation, wear light-colored protective clothing and gloves. Spot-check often to prevent ticks from reaching the skin. Ticks cannot jump or fly. They will not drop from an above-ground perch onto a passing animal. Once a tick gains access to human skin it climbs upward  until it reaches a more protected area. For example, the back of the knee, groin, navel, armpit, ears or nape of the neck. It then begins the slow process of embedding itself in the skin. Campers, hikers, field workers, and others who spend time in wooded, brushy or tall grassy areas can avoid exposure to ticks by using the following precautions:  Wear light-colored clothing with a tight weave to spot ticks more easily and prevent contact with the skin.  Wear long pants tucked into socks, long-sleeved shirts tucked into pants and enclosed shoes or boots along with insect repellent.  Spray clothes with insect repellent containing either DEET or Permethrin. Only DEET can be used on exposed skin. Follow the manufacturer's directions carefully.  Wear a hat and keep long hair pulled back.  Stay on cleared, well-worn trails whenever possible.  Spot-check yourself and others often for the presence of ticks on clothes. If you find one, there are likely to be others. Check thoroughly.  Remove clothes after leaving tick-infested areas. If possible, wash them to eliminate any unseen ticks. Check yourself, your children and any pets from head to toe for the presence of ticks.  Shower and shampoo. You can greatly reduce your chances of contracting RMSF if you remove attached ticks as soon as possible. Regular checks of the body, including all body sites covered by hair (head, armpits, genitals), allow removal of the tick before rickettsial transmission. To remove an attached tick, use a forceps or tweezers to detach the intact tick without leaving mouth parts in the skin. The tick bite wound should be cleansed after tick removal. Remember the most common symptoms of RMSF are fever, muscle aches, headache and nausea or vomiting with a later onset of rash. If you get these symptoms after a tick bite and while living in an area where RMSF is found, RMSF should be suspected. If the disease is not treated, it can  cause death. See your caregiver immediately if you get these symptoms. Do this even if not aware of a tick bite. Document Released: 08/14/2000 Document Revised: 07/25/2011 Document Reviewed: 04/06/2009 [facility] Patient Information 2014 [place], [place]. Infectious Mononucleosis Infectious mononucleosis (mono) is a common germ (viral) infection in children, teenagers, and young adults.  CAUSES  Mono is an infection caused by the Malachi Carl virus. The virus is spread by close personal contact with someone who has the infection. It can be passed by contact with your saliva through things such as kissing or sharing drinking glasses. Sometimes, the infection can be spread from someone who does not appear sick but still spreads the virus (asymptomatic carrier state).  SYMPTOMS  The most common symptoms of Mono are:  Sore throat.  Headache.  Fatigue.  Muscle aches.  Swollen glands.  Fever.  Poor appetite.  Enlarged liver or spleen. The less common symptoms can include:  Rash.  Feeling sick to your stomach (nauseous).  Abdominal pain. DIAGNOSIS  Mono is diagnosed by a blood test.  TREATMENT  Treatment of mono is usually at home. There is no medicine that cures this virus. Sometimes hospital treatment is needed in severe cases. Steroid medicine sometimes is needed if the swelling in the throat causes breathing  or swallowing problems.  HOME CARE INSTRUCTIONS   Drink enough fluids to keep your urine clear or pale yellow.  Eat soft foods. Cool foods like popsicles or ice cream can soothe a sore throat.  Only take over-the-counter or prescription medicines for pain, discomfort, or fever as directed by your caregiver. Children under 61 years of age should not take aspirin.  Gargle salt water. This may help relieve your sore throat. Put 1 teaspoon (tsp) of salt in 1 cup of warm water. Sucking on hard candy may also help.  Rest as needed.  Start regular activities gradually after the  fever is gone. Be sure to rest when tired.  Avoid strenuous exercise or contact sports until your caregiver says it is okay. The liver and spleen could be seriously injured.  Avoid sharing drinking glasses or kissing until your caregiver tells you that you are no longer contagious. SEEK MEDICAL CARE IF:   Your fever is not gone after 7 days.  Your activity level is not back to normal after 2 weeks.  You have yellow coloring to eyes and skin (jaundice). SEEK IMMEDIATE MEDICAL CARE IF:   You have severe pain in the abdomen or shoulder.  You have trouble swallowing or drooling.  You have trouble breathing.  You develop a stiff neck.  You develop a severe headache.  You cannot stop throwing up (vomiting).  You have convulsions.  You are confused.  You have trouble with balance.  You develop signs of body fluid loss (dehydration):  Weakness.  Sunken eyes.  Pale skin.  Dry mouth.  Rapid breathing or pulse. MAKE SURE YOU:   Understand these instructions.  Will watch your condition.  Will get help right away if you are not doing well or get worse. Document Released: 04/29/2000 Document Revised: 07/25/2011 Document Reviewed: 02/26/2008 [facility] Patient Information 2014 [place], [place].

## 2013-01-04 NOTE — Progress Notes (Signed)
Subjective:    Patient ID: Jennifer Luna, female    DOB: December 14, 1990, 22 y.o.   MRN: [id number]  HPI  22 year old female presents with sore throat for one week.  Led to right ear pain.  Those symptoms went away and now sneezing and runny nose.  Having cough and body aches.  Having to clear her throat a lot due to coughing.  Having pain around neck and knees.   On Wednesday night she was having abdominal pain.  Felt like she was going to pass out due to pain.  Loss of appetite.  No urinary sxs, no diarrhea, no vomiting.     Review of Systems Hx seizures    Objective:   Physical Exam  Constitutional: She is oriented to person, place, and time. She appears well-developed and well-nourished. No distress.  HENT:  Right Ear: External ear normal.  Left Ear: External ear normal.  Nose: Nose normal.  Mouth/Throat: Oropharynx is clear and moist.  Eyes: Conjunctivae and EOM are normal. Pupils are equal, round, and reactive to light.  Neck: Normal range of motion. Neck supple. No tracheal deviation present. No thyromegaly present.  Cardiovascular: Normal rate, regular rhythm and normal heart sounds.   Pulmonary/Chest: Effort normal.  Abdominal: Soft. Bowel sounds are normal. She exhibits no distension. There is no tenderness. There is no rebound and no guarding.  Musculoskeletal: Normal range of motion.  Neurological: She is alert and oriented to person, place, and time. She exhibits normal muscle tone. Coordination normal.  Psychiatric: She has a normal mood and affect. Her behavior is normal.    Results for orders placed in visit on 01/04/13  POCT CBC      Result Value Range   WBC 3.9 (*) 4.6 - 10.2 K/uL   Lymph, poc 1.9  0.6 - 3.4   POC LYMPH PERCENT 49.4  10 - 50 %L   MID (cbc) 0.2  0 - 0.9   POC MID % 6.2  0 - 12 %M   POC Granulocyte 1.7 (*) 2 - 6.9   Granulocyte percent 44.4  37 - 80 %G   RBC 4.75  4.04 - 5.48 M/uL   Hemoglobin 14.2  12.2 - 16.2 g/dL   HCT, POC 82.9  56.2 -  47.9 %   MCV 94.3  80 - 97 fL   MCH, POC 29.9  27 - 31.2 pg   MCHC 31.7 (*) 31.8 - 35.4 g/dL   RDW, POC 13.0     Platelet Count, POC 205  142 - 424 K/uL   MPV 11.2  0 - 99.8 fL  POCT UA - MICROSCOPIC ONLY      Result Value Range   WBC, Ur, HPF, POC 0-3     RBC, urine, microscopic 0-1     Bacteria, U Microscopic trace     Mucus, UA large     Epithelial cells, urine per micros 0-1     Crystals, Ur, HPF, POC neg     Casts, Ur, LPF, POC neg     Yeast, UA neg    POCT URINALYSIS DIPSTICK      Result Value Range   Color, UA yellow     Clarity, UA clear     Glucose, UA neg     Bilirubin, UA neg     Ketones, UA neg     Spec Grav, UA 1.025     Blood, UA neg     pH, UA 6.0  Protein, UA neg     Urobilinogen, UA 0.2     Nitrite, UA neg     Leukocytes, UA Negative           Assessment & Plan:  EBV panel sent Doxy to cover tick fevers

## 2013-01-05 LAB — EPSTEIN-BARR VIRUS VCA ANTIBODY PANEL
EBV EA IgG: <5 U/mL (ref <9.0)
EBV VCA IgG: 316 U/mL — ABNORMAL HIGH (ref <18.0)
EBV VCA IgM: <10 U/mL (ref <36.0)

## 2013-01-11 ENCOUNTER — Encounter: Payer: Self-pay | Admitting: Radiology

## 2013-01-29 ENCOUNTER — Other Ambulatory Visit: Payer: Self-pay | Admitting: Neurology

## 2013-01-30 NOTE — Telephone Encounter (Signed)
Rx signed and faxed.

## 2013-02-27 ENCOUNTER — Ambulatory Visit (INDEPENDENT_AMBULATORY_CARE_PROVIDER_SITE_OTHER): Payer: BC Managed Care – PPO | Admitting: Neurology

## 2013-02-27 ENCOUNTER — Encounter: Payer: Self-pay | Admitting: Neurology

## 2013-02-27 ENCOUNTER — Encounter (INDEPENDENT_AMBULATORY_CARE_PROVIDER_SITE_OTHER): Payer: Self-pay

## 2013-02-27 DIAGNOSIS — G40319 Generalized idiopathic epilepsy and epileptic syndromes, intractable, without status epilepticus: Secondary | ICD-10-CM

## 2013-02-27 DIAGNOSIS — G40A19 Absence epileptic syndrome, intractable, without status epilepticus: Secondary | ICD-10-CM

## 2013-02-27 DIAGNOSIS — F458 Other somatoform disorders: Secondary | ICD-10-CM

## 2013-02-27 MED ORDER — CLONAZEPAM 0.5 MG PO TABS: 0.5000 mg | ORAL_TABLET | Freq: Every evening | ORAL | Status: DC | PRN

## 2013-02-27 MED ORDER — SIMETHICONE 40 MG/0.6ML PO SUSP: 40.0000 mg | Freq: Four times a day (QID) | ORAL | Status: DC | PRN

## 2013-02-27 MED ORDER — TOPIRAMATE 100 MG PO TABS: 100.0000 mg | ORAL_TABLET | Freq: Two times a day (BID) | ORAL | Status: DC

## 2013-02-27 MED ORDER — ZONISAMIDE 25 MG PO CAPS: 50.0000 mg | ORAL_CAPSULE | Freq: Two times a day (BID) | ORAL | Status: DC

## 2013-02-27 MED ORDER — LACOSAMIDE 50 MG PO TABS: 100.0000 mg | ORAL_TABLET | Freq: Two times a day (BID) | ORAL | Status: DC

## 2013-02-27 NOTE — Progress Notes (Signed)
[facility]  Provider:  Melvyn Novas, M D  Referring Provider: No ref. provider found Primary Care Physician:  No primary provider on file.  No chief complaint on file.   HPI:  Jennifer Luna is a 22 y.o. female  Is seen here as a referral/ revisit  from Dr. Cleta Alberts.   Reiley is an established seizure patient , and has been followed in the past by Dr. Doreatha Martin at [facility] and previously by Dr. Rubin Payor. She had tried to Zonegran, phenobarbital, Keppra, Dilantin, Depakote, Tegretol and Diamox Cherilyn had significant word finding difficulties on Topamax and at times developed personality changes and slurring of speech on different medications. Highly effective for her was a combination of packed VI and PAT. Mellonie has mild been for over 10 months free of consults of seizures, but her mother occasionally still notices a day is look in her face and that may reflect an upper source in the background.  Medications are as listed the pill I have to refill medications today especially Zonegran she is taking 2 capsules 50 mg total by mouth once  a day- at night .  She's taking 2 Vimpat 100 mg by mouth twice a day, she's taking Topiramate 100 mg bid po. She's taking Klonopin or 0.5 mg a half tablet by mouth at bedtime     Review of Systems: Out of a complete 14 system review, the patient complains of only the following symptoms, and all other reviewed systems are negative.  "staring seizures", right shoulder pain since a seizure related fall. ROM limitation.  She feels her sleep is less restorative.  History   Social History  . Marital Status: Single    Spouse Name: N/A    Number of Children: N/A  . Years of Education: N/A   Occupational History  . Not on file.   Social History Main Topics  . Smoking status: Never Smoker   . Smokeless tobacco: Never Used  . Alcohol Use: No  . Drug Use: No  . Sexual Activity: Not on file   Other Topics  Concern  . Not on file   Social History Narrative  . No narrative on file    Family History  Problem Relation Age of Onset  . Hypothyroidism Mother   . Arthritis Mother   . Hypertension Father   . Diabetes Father   . Cancer Maternal Grandmother   . Heart disease Maternal Grandfather   . Heart disease Paternal Grandmother   . Lupus Maternal Aunt   . Seizures Paternal Uncle     as a child  . Asthma Sister     Past Medical History  Diagnosis Date  . Seizures     nx of absent and grand mal  . Thrombocytopenia 01/11/12    office notes from last visit, with zarontin and depakote  . Sleepiness 01/11/2012    notes from office visit  . Amenorrhea 01/11/2012    notes from office visit, PERIMENTRUAL PAINS  . Epilepsy     with absence seizures, abnormal EEG  . Sleepiness     DAYTIME    Past Surgical History  Procedure Laterality Date  . Shoulder injury      Current Outpatient Prescriptions  Medication Sig Dispense Refill  . clonazePAM (KLONOPIN) 0.5 MG tablet TAKE 1/2 TABLET BY MOUTH AT BEDTIME FOR 90 DAYS  45 tablet  1  . doxycycline (VIBRA-TABS) 100 MG tablet Take 1 tablet (100 mg total) by mouth 2 (  two) times daily.  20 tablet  0  . folic acid (FOLVITE) 1 MG tablet Take 1 mg by mouth daily.      Marland Kitchen ibuprofen (ADVIL,MOTRIN) 200 MG tablet Take 600 mg by mouth every 6 (six) hours as needed. For menstrual pain      . Multiple Vitamin (MULITIVITAMIN WITH MINERALS) TABS Take 1 tablet by mouth daily.      Marland Kitchen topiramate (TOPAMAX) 100 MG tablet Take 1 tablet (100 mg total) by mouth 2 (two) times daily.  180 tablet  3  . VIMPAT 50 MG TABS tablet TAKE 2 TABLETS BY MOUTH IN THE MORNING AND 2 TABLETS IN THE EVENING  360 tablet  1  . WYMZYA FE 0.4-35 MG-MCG tablet       . zonisamide (ZONEGRAN) 25 MG capsule Take 2 capsules (50 mg total) by mouth 2 (two) times daily.  360 capsule  3   No current facility-administered medications for this visit.    Allergies as of 02/27/2013 - Review  Complete 02/27/2013  Allergen Reaction Noted  . Penicillins Rash 06/11/2011  . Sulfa antibiotics Rash 06/11/2011  . Erythromycin Rash 06/11/2011    Vitals: There were no vitals taken for this visit. Last Weight:  Wt Readings from Last 1 Encounters:  01/04/13 126 lb (57.153 kg)   Last Height:   Ht Readings from Last 1 Encounters:  01/04/13 5\' 4"  (1.626 m)    Physical exam:Head: Normocephalic, atraumatic. Neck is supple. Mallampati1,  Red and irritated Pharynx.   Ear ; wax built up , has scab in the right ear- no fluid behind the drum.  Cardiovascular: Regular rate and rhythm without murmurs or carotid bruit, and without distended neck veins.  Respiratory: Lungs are clear to auscultation.  Skin: Without evidence of edema, or rash  Trunk: BMI is normal .  Neurologic exam :  The patient is awake and alert, oriented to place and time. Memory subjective described as impaired - . There is a normal attention span & concentration ability. Speech is fluent without dysarthria, dysphonia , but with aphasia. Mood and affect are appropriate.  Cranial nerves:  Pupils are equal and briskly reactive to light. Funduscopic exam without evidence of pallor or edema. Extraocular movements in vertical and horizontal planes intact and with an endpoint nystagmus. Visual fields by finger perimetry are intact.  Hearing to finger rub intact. Facial sensation intact to fine touch. Facial motor strength is symmetric and tongue and uvula move midline.  Motor exam: Normal tone and normal muscle bulk and symmetric normal strength in all extremities. Grip strength is normal.  Sensory: Fine touch, pinprick and vibration were tested in all extremities.  Proprioception is tested in the upper extremities only. This was normal.  Coordination: Rapid alternating movements in the fingers/hands is tested and normal. Finger-to-nose maneuver tested and normal without evidence of ataxia, dysmetria or tremor.  Gait and station:  Patient walks without assistive device and is able unassisted to climb up to the exam table.  Strength within normal limits. Stance is stable and normal. Steps are unfragmented.  Deep tendon reflexes: in the upper and lower extremities are symmetric and intact.      Assessment :  Primary absence seizures , controlled on VIMPAT , Zonogran and Topiramate.  Klonopin is more a sleeper than a seizure medication.   Plan:  Treatment plan and additional workup : refills medications for 90 days  Times 3 refills.   suspect allergic  Pharyngeal irritation, Claritin.

## 2013-04-15 ENCOUNTER — Ambulatory Visit: Payer: BC Managed Care – PPO | Admitting: Internal Medicine

## 2013-04-15 ENCOUNTER — Telehealth: Payer: Self-pay | Admitting: Neurology

## 2013-04-15 VITALS — BP 98/66 | HR 83 | Temp 98.1°F | Resp 16 | Ht 64.0 in | Wt 125.0 lb

## 2013-04-15 DIAGNOSIS — IMO0001 Reserved for inherently not codable concepts without codable children: Secondary | ICD-10-CM

## 2013-04-15 DIAGNOSIS — M791 Myalgia, unspecified site: Secondary | ICD-10-CM

## 2013-04-15 DIAGNOSIS — N946 Dysmenorrhea, unspecified: Secondary | ICD-10-CM

## 2013-04-15 DIAGNOSIS — M2550 Pain in unspecified joint: Secondary | ICD-10-CM

## 2013-04-15 DIAGNOSIS — R5381 Other malaise: Secondary | ICD-10-CM

## 2013-04-15 DIAGNOSIS — R5383 Other fatigue: Secondary | ICD-10-CM

## 2013-04-15 LAB — POCT CBC
Granulocyte percent: 58.9 %G (ref 37–80)
Lymph, poc: 1.6 (ref 0.6–3.4)
MCV: 95 fL (ref 80–97)
MPV: 11.3 fL (ref 0–99.8)
Platelet Count, POC: 195 10*3/uL (ref 142–424)
RDW, POC: 13.5 %
WBC: 4.4 10*3/uL — AB (ref 4.6–10.2)

## 2013-04-15 LAB — POCT URINALYSIS DIPSTICK
Bilirubin, UA: NEGATIVE
Leukocytes, UA: NEGATIVE
Nitrite, UA: NEGATIVE
Protein, UA: 30
pH, UA: 6.5

## 2013-04-15 LAB — POCT UA - MICROSCOPIC ONLY: Yeast, UA: NEGATIVE

## 2013-04-15 NOTE — Telephone Encounter (Signed)
Spoke with mother and she said that patient is weak, some aching in shoulder, no energy, was not able to go to work today because of this, is not taking any new meds, and is taking her seizure medicine as prescribed.

## 2013-04-15 NOTE — Progress Notes (Addendum)
Subjective:    Patient ID: Jennifer Luna, female    DOB: 25-Aug-1990, 22 y.o.   MRN: [id number] This chart was scribed for Molly Maduro , MD by Clydene Laming, ED Scribe. This patient was seen in room 11 and the patient's care was started at 5:10 PM. HPI HPI Comments: Jennifer Luna is a 22 y.o. female who presents to the [facility] complaining of generalized body aches and weakness with associated fatigue onset one month ago. Pt states the first episode lasted 3-4 days with aching pain in the legs, back, joints, and hips. Pt states symptoms went away without treatment. Her menstrual cycle was on during the time and was normal. Last week symptoms returned including fatigue, lasting for 1.5 days. This morning patient was having cramps and aching of the left calf. She also reports night sweats. Pt is not having trouble sleeping, any vision changes, sore throat, or trouble breathing. Pt has lost any weight in the last 3 months. Pt denies nausea, diarrhea, neck pain, myalgias of hands and fingers, and constipation. Pt has a hx of epilepsy and has been taking her medications for a long period of time. Her first seizure was at the age of 2. Pt previously fell onto her right shoulder while having a seizure, dislocating the shoulder. She has a family hx of arthtirits, anemia, and spastic colon. Pt had an EEG which was good. Pt works at [employer] part time. Her fatigue is interfering w/ work schedule.   Patient Active Problem List   Diagnosis Date Noted  . Intractable absence epilepsy 09/21/2012  . Pain in joint, shoulder region 09/21/2012  neuro Dr Richardean Chimera Past Medical History  Diagnosis Date  . Seizures     nx of absent and grand mal  . Thrombocytopenia 01/11/12    office notes from last visit, with zarontin and depakote  . Sleepiness 01/11/2012    notes from office visit  . Amenorrhea 01/11/2012    notes from office visit, PERIMENTRUAL PAINS  . Epilepsy     with absence  seizures, abnormal EEG  . Sleepiness     DAYTIME   Past Surgical History  Procedure Laterality Date  . Shoulder injury     Allergies  Allergen Reactions  . Penicillins Rash  . Sulfa Antibiotics Rash  . Erythromycin Rash   Prior to Admission medications   Medication Sig Start Date End Date Taking? Authorizing Provider  clonazePAM (KLONOPIN) 0.5 MG tablet Take 1 tablet (0.5 mg total) by mouth at bedtime as needed for anxiety. 02/27/13  Yes Carmen Dohmeier, MD  doxycycline (VIBRA-TABS) 100 MG tablet Take 1 tablet (100 mg total) by mouth 2 (two) times daily. 01/04/13  Yes Jonita Albee, MD  folic acid (FOLVITE) 1 MG tablet Take 1 mg by mouth daily.   Yes Historical Provider, MD  ibuprofen (ADVIL,MOTRIN) 200 MG tablet Take 600 mg by mouth every 6 (six) hours as needed. For menstrual pain   Yes Historical Provider, MD  lacosamide (VIMPAT) 50 MG TABS tablet Take 2 tablets (100 mg total) by mouth 2 (two) times daily. 2  Of 50 mg tablets twice a day. 02/27/13  Yes Carmen Dohmeier, MD  Multiple Vitamin (MULITIVITAMIN WITH MINERALS) TABS Take 1 tablet by mouth daily.   Yes Historical Provider, MD  simethicone (INFANTS SIMETHICONE) 40 MG/0.6ML drops Take 0.6 mLs (40 mg total) by mouth 4 (four) times daily as needed. 02/27/13  Yes Carmen Dohmeier, MD  topiramate (TOPAMAX) 100 MG tablet  Take 1 tablet (100 mg total) by mouth 2 (two) times daily. 02/27/13  Yes Carmen Dohmeier, MD  [facility] FE 0.4-35 MG-MCG tablet  08/18/12  Yes Historical Provider, MD  zonisamide (ZONEGRAN) 25 MG capsule Take 2 capsules (50 mg total) by mouth 2 (two) times daily. 02/27/13  Yes Melvyn Novas, MD  FH-Mom with fibro/b12 def/arthritis--aunt RA SH-grad hs/works partime   Review of Systems  Constitutional: Positive for activity change and fatigue. Negative for fever, chills, appetite change and unexpected weight change.  HENT: Negative for dental problem, rhinorrhea and sore throat.   Respiratory: Negative for cough,  shortness of breath and wheezing.   Cardiovascular: Negative for chest pain, palpitations and leg swelling.  Gastrointestinal: Negative for nausea, vomiting, abdominal pain, diarrhea and constipation.  Genitourinary: Negative for dysuria and frequency.  Musculoskeletal: Positive for arthralgias, joint swelling and myalgias. Negative for back pain and neck pain.  Skin: Negative for rash.  Neurological: Negative for tremors, weakness, light-headedness and headaches.  Hematological: Negative for adenopathy. Does not bruise/bleed easily.  Psychiatric/Behavioral: Negative for behavioral problems, confusion, sleep disturbance, dysphoric mood and agitation.       Objective:   Physical Exam  Constitutional: She is oriented to person, place, and time. She appears well-developed and well-nourished. No distress.  HENT:  Head: Normocephalic.  Right Ear: External ear normal.  Left Ear: External ear normal.  Nose: Nose normal.  Mouth/Throat: Oropharynx is clear and moist.  Eyes: Conjunctivae and EOM are normal. Pupils are equal, round, and reactive to light. Right eye exhibits no discharge. No scleral icterus.  Neck: Normal range of motion. Neck supple. No thyromegaly present.  Cardiovascular: Normal rate, regular rhythm, normal heart sounds and intact distal pulses.   No murmur heard. Pulmonary/Chest: Effort normal and breath sounds normal.  Abdominal: Soft. Bowel sounds are normal. She exhibits no mass. There is no tenderness.  Musculoskeletal: She exhibits no edema.  Mild discomfort in the anterior right shoulder with palpation. Ad and Ab and Ex ro all full w/out pain. The rest of the joints are not swollen or tender and all have full ROM.  Lymphadenopathy:    She has no cervical adenopathy.  Neurological: She is alert and oriented to person, place, and time. No cranial nerve deficit. She exhibits normal muscle tone.  Skin: No rash noted.  Psychiatric: She has a normal mood and affect. Her  behavior is normal.   Filed Vitals:   04/15/13 1649  BP: 98/66  Pulse: 83  Temp: 98.1 F (36.7 C)  TempSrc: Oral  Resp: 16  Height: 5\' 4"  (1.626 m)  Weight: 125 lb (56.7 kg)  SpO2: 100%     Results for orders placed in visit on 04/15/13  POCT CBC      Result Value Range   WBC 4.4 (*) 4.6 - 10.2 K/uL   Lymph, poc 1.6  0.6 - 3.4   POC LYMPH PERCENT 36.1  10 - 50 %L   MID (cbc) 0.2  0 - 0.9   POC MID % 5.0  0 - 12 %M   POC Granulocyte 2.6  2 - 6.9   Granulocyte percent 58.9  37 - 80 %G   RBC 4.56  4.04 - 5.48 M/uL   Hemoglobin 13.4  12.2 - 16.2 g/dL   HCT, POC 16.1  09.6 - 47.9 %   MCV 95.0  80 - 97 fL   MCH, POC 29.4  27 - 31.2 pg   MCHC 30.9 (*) 31.8 - 35.4 g/dL  RDW, POC 13.5     Platelet Count, POC 195  142 - 424 K/uL   MPV 11.3  0 - 99.8 fL  POCT UA - MICROSCOPIC ONLY      Result Value Range   WBC, Ur, HPF, POC 0-1     RBC, urine, microscopic 0-1     Bacteria, U Microscopic trace     Mucus, UA neg     Epithelial cells, urine per micros 0-1     Crystals, Ur, HPF, POC neg     Casts, Ur, LPF, POC neg     Yeast, UA neg    POCT URINALYSIS DIPSTICK      Result Value Range   Color, UA yellow     Clarity, UA clear     Glucose, UA neg     Bilirubin, UA neg     Ketones, UA 15     Spec Grav, UA >=1.030     Blood, UA neg     pH, UA 6.5     Protein, UA 30     Urobilinogen, UA 0.2     Nitrite, UA neg     Leukocytes, UA Negative          Assessment & Plan:  5:26 PM- Discussed treatment plan with pt at bedside. Pt verbalized understanding and agreement with plan.  I personally performed the services described in this documentation, which was scribed in my presence. The recorded information has been reviewed and is accurate.  Fatigue - Plan: POCT CBC, POCT SEDIMENTATION RATE, POCT UA - Microscopic Only, POCT urinalysis dipstick, Comprehensive metabolic panel, TSH, T4, free, ANA  Myalgia - Plan: POCT CBC, POCT SEDIMENTATION RATE, POCT UA - Microscopic Only, POCT  urinalysis dipstick, Comprehensive metabolic panel, TSH, B. Burgdorfi Antibodies, B. Burgdorfi Antibodies, ANA, CANCELED: ANA w/Reflex  Dysmenorrhea - Plan: POCT CBC, POCT SEDIMENTATION RATE, POCT UA - Microscopic Only, POCT urinalysis dipstick, Comprehensive metabolic panel, TSH, T4, free, ANA  Arthralgia - Plan: POCT CBC, POCT SEDIMENTATION RATE, POCT UA - Microscopic Only, POCT urinalysis dipstick, Comprehensive metabolic panel, TSH, B. Burgdorfi Antibodies, B. Burgdorfi Antibodies, ANA, CANCELED: ANA w/Reflex  Call with labs and plan

## 2013-04-16 LAB — COMPREHENSIVE METABOLIC PANEL
ALT: 16 U/L (ref 0–35)
AST: 16 U/L (ref 0–37)
Chloride: 110 mEq/L (ref 96–112)
Creat: 0.68 mg/dL (ref 0.50–1.10)
Total Bilirubin: 0.4 mg/dL (ref 0.3–1.2)

## 2013-04-16 LAB — T4, FREE: Free T4: 1.11 ng/dL (ref 0.80–1.80)

## 2013-04-18 ENCOUNTER — Telehealth: Payer: Self-pay

## 2013-04-18 ENCOUNTER — Other Ambulatory Visit: Payer: Self-pay | Admitting: Neurology

## 2013-04-18 DIAGNOSIS — F458 Other somatoform disorders: Secondary | ICD-10-CM

## 2013-04-18 DIAGNOSIS — G40A19 Absence epileptic syndrome, intractable, without status epilepticus: Secondary | ICD-10-CM

## 2013-04-18 LAB — ANA: Anti Nuclear Antibody(ANA): NEGATIVE

## 2013-04-18 MED ORDER — TOPIRAMATE 100 MG PO TABS: 100.0000 mg | ORAL_TABLET | Freq: Two times a day (BID) | ORAL | Status: DC

## 2013-04-18 NOTE — Telephone Encounter (Signed)
Spoke to mother - recommended fluid intake, temperature check, get tested for any viral illness. Not likely a neurologic problem.

## 2013-04-18 NOTE — Telephone Encounter (Signed)
Message copied by Malachy Moan on Thu Apr 18, 2013  3:45 PM ------      Message from: Seth Bake      Created: Thu Apr 18, 2013  2:41 PM       Please call ASAP.  They went to the pharmacy to get her seizure medication and they were told the prescription was canceled.  She is very upset.  Why did this happen.  She must have the prescription filled now.  Call now  ------

## 2013-04-18 NOTE — Telephone Encounter (Signed)
Rx was already sent, but for some reason pharmacy deactivated the Rx.  zonisamide (ZONEGRAN) 25 MG capsule 360 capsule 3 02/27/2013     Sig - Route: Take 2 capsules (50 mg total) by mouth 2 (two) times daily. - Oral    E-Prescribing Status: Receipt confirmed by pharmacy (02/27/2013 9:14 AM EDT)             Associated Diagnoses    Intractable absence epilepsy          Pharmacy    [id number] - [place], [place] - [address]

## 2013-04-18 NOTE — Telephone Encounter (Signed)
Dr. Doolittle, pt calling about labs. Please review. Thanks  

## 2013-04-18 NOTE — Telephone Encounter (Signed)
We did not cancel any Rx's.  I am unsure why the pharmacy is telling the patient this info.  I called the pharmacy.  They said they have Rx's on file for Zonegran and Topamax, but for some reason, they cancelled the  Rx in their system, reason is unknown.  They would like Rx's sent again.  I offered to give verbal order, but they requested we send new Erx's.  I have done so.  I called the patient back.  She is aware Rx's have been resent.  zonisamide (ZONEGRAN) 25 MG capsule 360 capsule 3 02/27/2013     Sig - Route: Take 2 capsules (50 mg total) by mouth 2 (two) times daily. - Oral    E-Prescribing Status: Receipt confirmed by pharmacy (02/27/2013 9:14 AM EDT)             Associated Diagnoses    Intractable absence epilepsy          Pharmacy    [id number] - [place], [place] - [address]

## 2013-04-18 NOTE — Telephone Encounter (Signed)
Patient is calling back about her results please call her at [phone]

## 2013-04-18 NOTE — Telephone Encounter (Signed)
Please advise on labs and plan, advised patient labs in but not reviewed. Reviewed with her what I could but could not advise on plan of care. Advised ANA neg no lupus, lyme screen neg no lyme disease kidney liver electrolytes good, advised I will have you review in more detail and advise of plan, I can call her.

## 2013-04-19 NOTE — Telephone Encounter (Signed)
See labs 

## 2013-04-19 NOTE — Telephone Encounter (Signed)
i had lab respond

## 2013-04-22 ENCOUNTER — Encounter (HOSPITAL_COMMUNITY): Payer: Self-pay | Admitting: Emergency Medicine

## 2013-04-22 ENCOUNTER — Emergency Department (HOSPITAL_COMMUNITY): Payer: BC Managed Care – PPO

## 2013-04-22 ENCOUNTER — Other Ambulatory Visit: Payer: Self-pay

## 2013-04-22 ENCOUNTER — Emergency Department (HOSPITAL_COMMUNITY)
Admission: EM | Admit: 2013-04-22 | Discharge: 2013-04-22 | Disposition: A | Discharge status: Home / Self Care | Payer: BC Managed Care – PPO | Attending: Emergency Medicine | Admitting: Emergency Medicine

## 2013-04-22 DIAGNOSIS — R0989 Other specified symptoms and signs involving the circulatory and respiratory systems: Secondary | ICD-10-CM | POA: Insufficient documentation

## 2013-04-22 DIAGNOSIS — R0600 Dyspnea, unspecified: Secondary | ICD-10-CM

## 2013-04-22 DIAGNOSIS — R0609 Other forms of dyspnea: Secondary | ICD-10-CM | POA: Insufficient documentation

## 2013-04-22 DIAGNOSIS — Z880 Allergy status to penicillin: Secondary | ICD-10-CM | POA: Insufficient documentation

## 2013-04-22 DIAGNOSIS — Z3202 Encounter for pregnancy test, result negative: Secondary | ICD-10-CM | POA: Insufficient documentation

## 2013-04-22 DIAGNOSIS — R5381 Other malaise: Secondary | ICD-10-CM | POA: Insufficient documentation

## 2013-04-22 DIAGNOSIS — R531 Weakness: Secondary | ICD-10-CM

## 2013-04-22 DIAGNOSIS — Z79899 Other long term (current) drug therapy: Secondary | ICD-10-CM | POA: Insufficient documentation

## 2013-04-22 DIAGNOSIS — G40909 Epilepsy, unspecified, not intractable, without status epilepticus: Secondary | ICD-10-CM | POA: Insufficient documentation

## 2013-04-22 LAB — BASIC METABOLIC PANEL
BUN: 13 mg/dL (ref 6–23)
Calcium: 9.3 mg/dL (ref 8.4–10.5)
Chloride: 105 mEq/L (ref 96–112)
Creatinine, Ser: 0.71 mg/dL (ref 0.50–1.10)
GFR calc Af Amer: >90 mL/min (ref >90)

## 2013-04-22 LAB — CBC WITH DIFFERENTIAL/PLATELET
Basophils Relative: 0 % (ref 0–1)
Eosinophils Relative: 2 % (ref 0–5)
HCT: 42.7 % (ref 36.0–46.0)
Hemoglobin: 14.4 g/dL (ref 12.0–15.0)
Lymphs Abs: 1.6 10*3/uL (ref 0.7–4.0)
MCH: 29.9 pg (ref 26.0–34.0)
MCHC: 33.7 g/dL (ref 30.0–36.0)
MCV: 88.8 fL (ref 78.0–100.0)
Monocytes Absolute: 0.3 10*3/uL (ref 0.1–1.0)
Monocytes Relative: 10 % (ref 3–12)
Neutro Abs: 1.3 10*3/uL — ABNORMAL LOW (ref 1.7–7.7)
RBC: 4.81 MIL/uL (ref 3.87–5.11)
RDW: 12.6 % (ref 11.5–15.5)

## 2013-04-22 LAB — URINALYSIS, ROUTINE W REFLEX MICROSCOPIC
Bilirubin Urine: NEGATIVE
Glucose, UA: NEGATIVE mg/dL
Hgb urine dipstick: NEGATIVE
Ketones, ur: NEGATIVE mg/dL
Protein, ur: NEGATIVE mg/dL
pH: 6.5 (ref 5.0–8.0)

## 2013-04-22 LAB — URINE MICROSCOPIC-ADD ON

## 2013-04-22 LAB — POCT I-STAT TROPONIN I

## 2013-04-22 MED ORDER — SODIUM CHLORIDE 0.9 % IV BOLUS (SEPSIS)
1000.0000 mL | Freq: Once | INTRAVENOUS | Status: AC
  Administered 2013-04-22: 1000 mL via INTRAVENOUS

## 2013-04-22 NOTE — Telephone Encounter (Signed)
Thank you :)

## 2013-04-22 NOTE — ED Provider Notes (Signed)
CSN: [id number]     Arrival date & time 04/22/13  1101 History   First MD Initiated Contact with Patient 04/22/13 1157     Chief Complaint  Patient presents with  . Shortness of Breath  . Weakness   (Consider location/radiation/quality/duration/timing/severity/associated sxs/prior Treatment) HPI Comments: Pt with no medical hx comes in with cc of dib and weakness. Pt has no medical hx. Weakness x 1 month, generalized, and now she has some exertional dyspnea - which is improved with rest. Pt has no cardiac risk factor. There is no chest pain. Pt at times feels like she might pass out. No hx of substance abuse, no premature CAD in the family. Saw her PCP, and basic labs were all WNL, and so was thyroid. No hx of PE, DVT  Patient is a 22 y.o. female presenting with shortness of breath and weakness. The history is provided by the patient.  Shortness of Breath Associated symptoms: no abdominal pain, no chest pain, no cough, no neck pain, no vomiting and no wheezing   Weakness Associated symptoms include shortness of breath. Pertinent negatives include no chest pain and no abdominal pain.    Past Medical History  Diagnosis Date  . Seizures     nx of absent and grand mal  . Thrombocytopenia 01/11/12    office notes from last visit, with zarontin and depakote  . Sleepiness 01/11/2012    notes from office visit  . Amenorrhea 01/11/2012    notes from office visit, PERIMENTRUAL PAINS  . Epilepsy     with absence seizures, abnormal EEG  . Sleepiness     DAYTIME   Past Surgical History  Procedure Laterality Date  . Shoulder injury     Family History  Problem Relation Age of Onset  . Hypothyroidism Mother   . Arthritis Mother   . Hypertension Father   . Diabetes Father   . Cancer Maternal Grandmother   . Heart disease Maternal Grandfather   . Heart disease Paternal Grandmother   . Lupus Maternal Aunt   . Seizures Paternal Uncle     as a child  . Asthma Sister    History   Substance Use Topics  . Smoking status: Never Smoker   . Smokeless tobacco: Never Used  . Alcohol Use: No   OB History   Grav Para Term Preterm Abortions TAB SAB Ect Mult Living                 Review of Systems  Constitutional: Positive for fatigue. Negative for activity change.  HENT: Negative for facial swelling.   Respiratory: Positive for shortness of breath. Negative for cough and wheezing.   Cardiovascular: Negative for chest pain.  Gastrointestinal: Negative for nausea, vomiting, abdominal pain, diarrhea, constipation, blood in stool and abdominal distention.  Genitourinary: Negative for hematuria and difficulty urinating.  Musculoskeletal: Negative for neck pain.  Skin: Negative for color change.  Neurological: Positive for weakness. Negative for speech difficulty.  Hematological: Does not bruise/bleed easily.  Psychiatric/Behavioral: Negative for confusion.    Allergies  Penicillins; Sulfa antibiotics; and Erythromycin  Home Medications   Current Outpatient Rx  Name  Route  Sig  Dispense  Refill  . clonazePAM (KLONOPIN) 0.5 MG tablet   Oral   Take 0.25 mg by mouth at bedtime.         . cyanocobalamin 1000 MCG tablet   Oral   Take 100 mcg by mouth daily.         . folic  acid (FOLVITE) 1 MG tablet   Oral   Take 1 mg by mouth daily.         Marland Kitchen ibuprofen (ADVIL,MOTRIN) 200 MG tablet   Oral   Take 600 mg by mouth every 6 (six) hours as needed. For menstrual pain         . lacosamide (VIMPAT) 50 MG TABS tablet   Oral   Take 2 tablets (100 mg total) by mouth 2 (two) times daily. 2  Of 50 mg tablets twice a day.   360 tablet   3     Pharmacy Fax [phone]   . magnesium oxide (MAG-OX) 400 MG tablet   Oral   Take 400 mg by mouth daily.         . simethicone (INFANTS SIMETHICONE) 40 MG/0.6ML drops   Oral   Take 0.6 mLs (40 mg total) by mouth 4 (four) times daily as needed.   30 mL   0   . topiramate (TOPAMAX) 100 MG tablet   Oral   Take 1  tablet (100 mg total) by mouth 2 (two) times daily.   180 tablet   3   . WYMZYA FE 0.4-35 MG-MCG tablet               . zonisamide (ZONEGRAN) 25 MG capsule   Oral   Take 2 capsules (50 mg total) by mouth 2 (two) times daily.   360 capsule   3    BP 117/78  Pulse 80  Temp(Src) 98.6 F (37 C)  Resp 20  Ht 5\' 5"  (1.651 m)  Wt 125 lb (56.7 kg)  BMI 20.80 kg/m2  SpO2 100%  LMP 04/16/2013 Physical Exam  Nursing note and vitals reviewed. Constitutional: She is oriented to person, place, and time. She appears well-developed and well-nourished.  HENT:  Head: Normocephalic and atraumatic.  Eyes: EOM are normal. Pupils are equal, round, and reactive to light.  Neck: Neck supple.  Cardiovascular: Normal rate, regular rhythm and normal heart sounds.   No murmur heard. Pulmonary/Chest: Effort normal. No respiratory distress.  Abdominal: Soft. She exhibits no distension. There is no tenderness. There is no rebound and no guarding.  Neurological: She is alert and oriented to person, place, and time.  Skin: Skin is warm and dry.    ED Course  Procedures (including critical care time) Labs Review Labs Reviewed  CBC WITH DIFFERENTIAL - Abnormal; Notable for the following:    WBC 3.3 (*)    Neutrophils Relative % 40 (*)    Neutro Abs 1.3 (*)    Lymphocytes Relative 48 (*)    All other components within normal limits  BASIC METABOLIC PANEL  D-DIMER, QUANTITATIVE  URINALYSIS, ROUTINE W REFLEX MICROSCOPIC   Imaging Review Dg Chest 2 View (if Patient Has Fever And/or Copd)  04/22/2013   CLINICAL DATA:  Short of breath, central chest pain  EXAM: CHEST  2 VIEW  COMPARISON:  None.  FINDINGS: The lungs are clear and negative for focal airspace consolidation, pulmonary edema or suspicious pulmonary nodule. No pleural effusion or pneumothorax. Cardiac and mediastinal contours are within normal limits. No acute fracture or lytic or blastic osseous lesions. The visualized upper abdominal  bowel gas pattern is unremarkable.  IMPRESSION: No active cardiopulmonary disease.   Electronically Signed   By: Malachy Moan M.D.   On: 04/22/2013 12:19    EKG Interpretation   None       MDM   1. Dyspnea on exertion  2. Weakness generalized    Pt comes in with cc of weakness and dib - exertional x 1 month. She has no orthopnea, PND, no leg swelling, no distended neck veins. Doubt CHF. CXR shows no pleural effusion, pul edema.. Pt is PERC neg - but dimer ordered - and is negative as well.  I am not sure what is causing her exertional dyspnea. Spoke with mother and patient when i saw them that our workup will be for emergent cause only -  And they mention that they have already seen pcp, and they want a month long log before seeing patient again. i am giving them f/u with Pulm, as cardiac workup, outside of echo has been normal.   Derwood Kaplan, MD 04/22/13 1540

## 2013-04-22 NOTE — ED Notes (Signed)
Pt is aware that a urine sample is needed and will try shortly. 

## 2013-04-22 NOTE — ED Notes (Signed)
Patient reports feeling weak, and body aches, chills x 2 weeks and then SOB a week ago. Patient states she went to the UC a week ago and found nothing wrong. Patient reports that the SOB has increased and is worse with movement and feeling of syncope at times.

## 2013-04-23 ENCOUNTER — Ambulatory Visit (INDEPENDENT_AMBULATORY_CARE_PROVIDER_SITE_OTHER): Payer: BC Managed Care – PPO | Admitting: Internal Medicine

## 2013-04-23 ENCOUNTER — Encounter: Payer: Self-pay | Admitting: Neurology

## 2013-04-23 ENCOUNTER — Ambulatory Visit (HOSPITAL_COMMUNITY)
Admission: RE | Admit: 2013-04-23 | Discharge: 2013-04-23 | Disposition: A | Discharge status: Home / Self Care | Payer: BC Managed Care – PPO | Source: Ambulatory Visit | Attending: Internal Medicine | Admitting: Internal Medicine

## 2013-04-23 ENCOUNTER — Ambulatory Visit (INDEPENDENT_AMBULATORY_CARE_PROVIDER_SITE_OTHER): Payer: BC Managed Care – PPO | Admitting: Neurology

## 2013-04-23 ENCOUNTER — Telehealth: Payer: Self-pay | Admitting: Neurology

## 2013-04-23 ENCOUNTER — Encounter: Payer: Self-pay | Admitting: Internal Medicine

## 2013-04-23 VITALS — BP 112/70 | HR 70 | Temp 98.5°F | Ht 65.0 in | Wt 128.0 lb

## 2013-04-23 VITALS — BP 108/69 | HR 75 | Resp 16 | Ht 61.0 in | Wt 129.0 lb

## 2013-04-23 DIAGNOSIS — R5381 Other malaise: Secondary | ICD-10-CM | POA: Insufficient documentation

## 2013-04-23 DIAGNOSIS — G40319 Generalized idiopathic epilepsy and epileptic syndromes, intractable, without status epilepticus: Secondary | ICD-10-CM

## 2013-04-23 DIAGNOSIS — F458 Other somatoform disorders: Secondary | ICD-10-CM

## 2013-04-23 DIAGNOSIS — R0602 Shortness of breath: Secondary | ICD-10-CM | POA: Insufficient documentation

## 2013-04-23 DIAGNOSIS — G40A19 Absence epileptic syndrome, intractable, without status epilepticus: Secondary | ICD-10-CM

## 2013-04-23 DIAGNOSIS — M2550 Pain in unspecified joint: Secondary | ICD-10-CM

## 2013-04-23 HISTORY — DX: Pain in unspecified joint: M25.50

## 2013-04-23 HISTORY — DX: Shortness of breath: R06.02

## 2013-04-23 MED ORDER — LACOSAMIDE 50 MG PO TABS: 100.0000 mg | ORAL_TABLET | Freq: Two times a day (BID) | ORAL | Status: DC

## 2013-04-23 MED ORDER — IOHEXOL 350 MG/ML SOLN
100.0000 mL | Freq: Once | INTRAVENOUS | Status: AC | PRN
  Administered 2013-04-23: 100 mL via INTRAVENOUS

## 2013-04-23 MED ORDER — TOPIRAMATE 100 MG PO TABS: 100.0000 mg | ORAL_TABLET | Freq: Two times a day (BID) | ORAL | Status: DC

## 2013-04-23 NOTE — Telephone Encounter (Signed)
Spoke with mother and she said that they have gone to pcp and have made an ER visit, they did a complete work up and did not find anything but patient continue to have shortness of breath.  Could this be coming from her prescribed medications?

## 2013-04-23 NOTE — Telephone Encounter (Signed)
Calling because daughter has been sick for a couple of weeks and is having shortness of breath. She has taken her to her PCP and the ER and now thinks after reading the side effects of her seizure medication is shortness of breath and wants to speak to someone regarding this.

## 2013-04-23 NOTE — Progress Notes (Signed)
[facility]  Provider:  Melvyn Novas, M D  Referring Provider: No ref. provider found Primary Care Physician:  Dr. Cleta Alberts   Chief Complaint  Patient presents with  . Shortness of Breath    HPI:  Jennifer Luna is a 22 y.o. female  Is seen here as a  revisit  from Dr. Cleta Alberts.   Interval history :  Patient has been suffering from shortness of breath , fatigue and feels too sick to function.  Jennifer Luna has been seen by various epileptologist, at [facility] and by Dr. Sharene Skeans here at [facility].  She had her first seizure at age 61, her mother described her arresting in motion , staring and urinating on herself. She was treated for absence seizures with secondary generalization. Had one febrile seizure at age 23 month. She was seen 2 times in the ED in 10 days, has been undergoing tests for progressive shortness of breath , PFT were normal ,labs normal, urgent care visit normal, she feels that little movements such as closing the car door lead to SOB. She took a  a new brand of mutlivitamins for the last 3 weeks and stopped taking these 4 days ago but no changes occurred. Yesterday had an ED visit. Comparing the notes and blood work , she developed likely a viral infection - her lymphocytes are up and neur trophils down. She has endorsed arthralgia and not swelling at joints or muscles, short breath associated with pain with deep breathing. She believes that an increase in Vimpat 3 month ago may have caused her to have the SOB, which I explained is unlikely.   I will reduce VIMPAT to 50 mg in AM and keep 100 mg in PM.  She has a low dose of Klonipin available prn insomnia , which has been a seizure trigger.  I will review Zonisamide and contact Dr Sharene Skeans about Zarontin , for absence seizures- which have not been seen in a year.  Her PSG with seizure Montage showed absence pattern electrographic events, no associated clinical seizures.         Last note:  Jennifer Luna is an established  seizure patient , and has been followed in the past by Dr. Doreatha Martin at [facility] and previously by Dr. Gavin Potters.  Treated for absence seizure with sec. motor tonic- clonic spells,.  Last seziure i April 2013 . She has had twitching in Apri of this year after  Forgetting a dose.  She is allowed to drive.  She had tried on Zonisamide, phenobarbital, Keppra, Dilantin, Depakote, Tegretol and Diamox - Jennifer Luna had significant word finding difficulties on Topamax and at times developed personality changes and slurring of speech on different medications. Highly effective for her was a combination of packed VI and PAT. Jennifer Luna has mild been for over 10 months free of consults of seizures, but her mother occasionally still notices a day is look in her face and that may reflect an upper source in the background. Medications are as listed the pill I have to refill medications today especially Zonegran she is taking 2 capsules 50 mg total by mouth once  a day- at night .  She's taking 2 Vimpat 100 mg by mouth twice a day, she's taking Topiramate 100 mg bid po. She's taking Klonopin or 0.5 mg a half tablet by mouth at bedtime     Review of Systems: Out of a complete 14 system review, the patient complains of only the following symptoms, and all other  reviewed systems are negative.  "staring seizures", right shoulder pain since a seizure related fall. ROM limitation.  She feels her sleep is less restorative.  History   Social History  . Marital Status: Single    Spouse Name: N/A    Number of Children: N/A  . Years of Education: N/A   Occupational History  . Not on file.   Social History Main Topics  . Smoking status: Never Smoker   . Smokeless tobacco: Never Used  . Alcohol Use: No  . Drug Use: No  . Sexual Activity: Not on file   Other Topics Concern  . Not on file   Social History Narrative  . No narrative on file    Family History  Problem Relation Age of Onset  .  Hypothyroidism Mother   . Arthritis Mother   . Hypertension Father   . Diabetes Father   . Cancer Maternal Grandmother   . Heart disease Maternal Grandfather   . Heart disease Paternal Grandmother   . Lupus Maternal Aunt   . Seizures Paternal Uncle     as a child  . Asthma Sister     Past Medical History  Diagnosis Date  . Seizures     nx of absent and grand mal  . Thrombocytopenia 01/11/12    office notes from last visit, with zarontin and depakote  . Sleepiness 01/11/2012    notes from office visit  . Amenorrhea 01/11/2012    notes from office visit, PERIMENTRUAL PAINS  . Epilepsy     with absence seizures, abnormal EEG  . Sleepiness     DAYTIME    Past Surgical History  Procedure Laterality Date  . Shoulder injury      Current Outpatient Prescriptions  Medication Sig Dispense Refill  . clonazePAM (KLONOPIN) 0.5 MG tablet Take 0.25 mg by mouth at bedtime.      . cyanocobalamin 1000 MCG tablet Take 100 mcg by mouth daily.      . folic acid (FOLVITE) 1 MG tablet Take 1 mg by mouth daily.      Marland Kitchen lacosamide (VIMPAT) 50 MG TABS tablet Take 2 tablets (100 mg total) by mouth 2 (two) times daily. 2  Of 50 mg tablets twice a day.  360 tablet  3  . magnesium oxide (MAG-OX) 400 MG tablet Take 400 mg by mouth daily.      . simethicone (INFANTS SIMETHICONE) 40 MG/0.6ML drops Take 0.6 mLs (40 mg total) by mouth 4 (four) times daily as needed.  30 mL  0  . topiramate (TOPAMAX) 100 MG tablet Take 1 tablet (100 mg total) by mouth 2 (two) times daily.  180 tablet  3  . WYMZYA FE 0.4-35 MG-MCG tablet       . zonisamide (ZONEGRAN) 25 MG capsule Take 2 capsules (50 mg total) by mouth 2 (two) times daily.  360 capsule  3  . ibuprofen (ADVIL,MOTRIN) 200 MG tablet Take 600 mg by mouth every 6 (six) hours as needed. For menstrual pain       No current facility-administered medications for this visit.    Allergies as of 04/23/2013 - Review Complete 04/23/2013  Allergen Reaction Noted  .  Penicillins Rash 06/11/2011  . Sulfa antibiotics Rash 06/11/2011  . Erythromycin Rash 06/11/2011    Vitals: BP 108/69  Pulse 75  Resp 16  Ht 5\' 1"  (1.549 m)  Wt 129 lb (58.514 kg)  BMI 24.39 kg/m2  LMP 04/16/2013 Last Weight:  Wt Readings from Last  1 Encounters:  04/23/13 129 lb (58.514 kg)   Last Height:   Ht Readings from Last 1 Encounters:  04/23/13 5\' 1"  (1.549 m)    Physical exam:Head: Normocephalic, atraumatic. Neck is supple. Mallampati1,  Red and irritated Pharynx.   Ear ; wax built up , has scab in the right ear- no fluid behind the drum.  Cardiovascular: Regular rate and rhythm without murmurs or carotid bruit, and without distended neck veins.  Respiratory: Lungs are clear to auscultation.  Skin: Without evidence of edema, or rash  Trunk: BMI is normal .  Neurologic exam :  The patient is awake and alert, oriented to place and time. Memory subjective described as impaired - .  There is a normal attention span & concentration ability. Speech is fluent without dysarthria, dysphonia , but with aphasia. Mood and affect are appropriate.  Cranial nerves:  Pupils are equal and briskly reactive to light. Funduscopic exam without evidence of pallor or edema. Extraocular movements in vertical and horizontal planes intact and with an endpoint nystagmus. Visual fields by finger perimetry are intact.  Hearing to finger rub intact. Facial sensation intact to fine touch. Facial motor strength is symmetric and tongue and uvula move midline.  Motor exam: Normal tone and normal muscle bulk and symmetric normal strength in all extremities. Grip strength is normal.  Sensory: Fine touch, pinprick and vibration were tested in all extremities.  Proprioception is tested in the upper extremities only. This was normal.  Coordination: Rapid alternating movements in the fingers/hands is tested and normal.  Finger-to-nose maneuver tested and normal without evidence of ataxia, dysmetria or tremor.   Gait and station: Patient walks without assistive device and is able unassisted to climb up to the exam table.  Strength within normal limits. Stance is stable and normal.  Steps are unfragmented.  Deep tendon reflexes: in the upper and lower extremities are brisk -symmetric and intact. Normal babinski.     Assessment : recent 4-5 weeks onset of arthralgia and myalgia with SOB , normal Cx- Rays , normal uinary and negative pregnancy test,  cbc differential showed yesterday some slight abnormalities.   1)  Primary absence seizures , controlled on VIMPAT , Zonogran and Topiramate.  Klonopin is more a sleeper than a seizure medication.   Plan:  Treatment plan and additional workup : Encouraged fluids and Tylenol , ibuprofen.  Reduced VIMPAT by 50 mg in AM.   await pulmonology input.

## 2013-04-23 NOTE — Patient Instructions (Signed)
Please see patient coordinator before you leave today  to schedule CT asap > if negative I will be referring you back to Dr Merla Riches for further evaluation of your fatigue and aches and pains

## 2013-04-23 NOTE — Progress Notes (Signed)
   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, [employer] and did well until Nov 2014 with fairly abrupt onset fatigue while on period  first week in November waxed and waned assoc with aches and pains then sob x new sob since right after Tgiving so referred to pulmonary clinic 04/23/2013 by ER   04/23/2013 [facility] office visit/ Valdez Brannan cc doe x 2 weeks, short of breath across parking lot, dancing, talking , better at rest assoc with t  bilateral ant sense of chest tightness s radiation, also comes and goes s pattern  No obvious pattern in day to day or daytime variabilty or assoc chronic cough or    chest tightness, subjective wheeze overt sinus or hb symptoms. No unusual exp hx or h/o childhood pna/ asthma or knowledge of premature birth.  Sleeping ok without nocturnal  or early am exacerbation  of respiratory  c/o's or need for noct saba. Also denies any obvious fluctuation of symptoms with weather or environmental changes or other aggravating or alleviating factors except as outlined above   Current Medications, Allergies, Complete Past Medical History, Past Surgical History, Family History, and Social History were reviewed in [employer] record.               Review of Systems  Constitutional: Negative for fever, chills and unexpected weight change.  HENT: Positive for ear pain. Negative for congestion, dental problem, nosebleeds, postnasal drip, rhinorrhea, sinus pressure, sneezing, sore throat, trouble swallowing and voice change.   Eyes: Negative for visual disturbance.  Respiratory: Positive for shortness of breath. Negative for cough and choking.   Cardiovascular: Negative for chest pain and leg swelling.  Gastrointestinal: Negative for vomiting, abdominal pain and diarrhea.  Genitourinary: Negative for difficulty urinating.   Musculoskeletal: Positive for arthralgias.  Skin: Negative for rash.  Neurological: Negative for tremors, syncope and headaches.  Hematological: Does not bruise/bleed easily.       Objective:   Physical Exam  amb somber bf with somewhat of a belle indifference affect  Wt Readings from Last 3 Encounters:  04/23/13 128 lb (58.06 kg)  04/23/13 129 lb (58.514 kg)  04/22/13 125 lb (56.7 kg)      HEENT: nl dentition, turbinates, and orophanx. Nl external ear canals without cough reflex   NECK :  without JVD/Nodes/TM/ nl carotid upstrokes bilaterally   LUNGS: no acc muscle use, clear to A and P bilaterally without cough on insp or exp maneuvers   CV:  RRR  no s3 or murmur or increase in P2, no edema   ABD:  soft and nontender with nl excursion in the supine position. No bruits or organomegaly, bowel sounds nl  MS:  warm without deformities, calf tenderness, cyanosis or clubbing  SKIN: warm and dry without lesions    NEURO:  alert, approp, no deficits    ESR 5 04/15/13  CBC with increased lymphs 04/22/13 CTa 04/23/13 > wnl      Assessment & Plan:

## 2013-04-23 NOTE — Patient Instructions (Signed)
That Ceili may half arthralgia or weakness diffuse aches and pains as well as shortness of breath based on a viral intact. Reviewing her lab tests from Dr. Deforest Hoyles office with  Those of the emergency room I saw that her neutrophil count has been reduced and lymphocytic count slightly elevated,  which would fit a viral infection pattern.  There were no a CK-CK-MB or autoimmune panels ordered. I still doubt very much that this is an antiepileptic medication side effect but I feel that overall she may need to reduce the impact is the main fatiguing and sleepy making medication given her all for all picture of illness right now. I suspect that this may be a manifestation of a mosquito borne illness? On something like Chad Nile virus-  the patient was very shortly before onset of symptoms visiting a friend in [place] and went to out door  activity.  This is not a typical season  but still mosquitoes  were still active through October in this area.

## 2013-04-24 ENCOUNTER — Ambulatory Visit: Payer: BC Managed Care – PPO | Admitting: Family Medicine

## 2013-04-24 ENCOUNTER — Telehealth: Payer: Self-pay | Admitting: Radiology

## 2013-04-24 VITALS — BP 102/68 | HR 98 | Temp 97.9°F | Resp 16 | Ht 64.0 in | Wt 125.0 lb

## 2013-04-24 DIAGNOSIS — R0600 Dyspnea, unspecified: Secondary | ICD-10-CM

## 2013-04-24 DIAGNOSIS — R0609 Other forms of dyspnea: Secondary | ICD-10-CM

## 2013-04-24 DIAGNOSIS — IMO0001 Reserved for inherently not codable concepts without codable children: Secondary | ICD-10-CM

## 2013-04-24 DIAGNOSIS — R5381 Other malaise: Secondary | ICD-10-CM

## 2013-04-24 DIAGNOSIS — R5383 Other fatigue: Secondary | ICD-10-CM

## 2013-04-24 DIAGNOSIS — M791 Myalgia, unspecified site: Secondary | ICD-10-CM

## 2013-04-24 LAB — POCT CBC
Granulocyte percent: 44.9 %G (ref 37–80)
HCT, POC: 46.1 % (ref 37.7–47.9)
Hemoglobin: 14.1 g/dL (ref 12.2–16.2)
Lymph, poc: 1.8 (ref 0.6–3.4)
MCH, POC: 29.1 pg (ref 27–31.2)
MCHC: 30.6 g/dL — AB (ref 31.8–35.4)
MID (cbc): 0.2 (ref 0–0.9)
MPV: 11.1 fL (ref 0–99.8)
POC Granulocyte: 1.6 — AB (ref 2–6.9)
POC LYMPH PERCENT: 49.7 %L (ref 10–50)
POC MID %: 5.4 %M (ref 0–12)
RDW, POC: 13.3 %
WBC: 3.6 10*3/uL — AB (ref 4.6–10.2)

## 2013-04-24 LAB — COMPREHENSIVE METABOLIC PANEL
ALT: 17 U/L (ref 0–35)
AST: 17 U/L (ref 0–37)
Alkaline Phosphatase: 37 U/L — ABNORMAL LOW (ref 39–117)
BUN: 14 mg/dL (ref 6–23)
CO2: 22 mEq/L (ref 19–32)
Chloride: 106 mEq/L (ref 96–112)
Creat: 0.74 mg/dL (ref 0.50–1.10)
Sodium: 139 mEq/L (ref 135–145)
Total Bilirubin: 0.4 mg/dL (ref 0.3–1.2)

## 2013-04-24 LAB — MAGNESIUM: Magnesium: 2.3 mg/dL (ref 1.5–2.5)

## 2013-04-24 LAB — VITAMIN B12: Vitamin B-12: 911 pg/mL (ref 211–911)

## 2013-04-24 LAB — CK: Total CK: 74 U/L (ref 7–177)

## 2013-04-24 NOTE — Patient Instructions (Addendum)
We will check mono and CMV tests, other blood tests as discussed. Let me know if you would like me to refer you to a cardiologist for these symptoms, but oxygen level ok here in office including with walking. If any progression of weakness, or other new changes to your symptoms - return here or emergency room. You should receive a call or letter about your lab results within the next week to 10 days. Plan on recheck with myself or Dr. Merla Riches in next 1 week. Sooner if worse as above.

## 2013-04-24 NOTE — Progress Notes (Signed)
Quick Note:  Spoke with pt and notified of results per Dr. Wert. Pt verbalized understanding and denied any questions.  ______ 

## 2013-04-24 NOTE — Assessment & Plan Note (Addendum)
-    04/23/2013  Walked RA x 3 laps @ 185 ft each stopped due to end of study, no sob, no desat - 04/23/13 CTa > neg  Unable to reproduce her symptoms with walking here and she appeared more fatigued than anything else.  This plus the elevated lymphs suggests a viral infection (like mono) or perhaps a reaction to one of her sz meds but she does not appear to have any form of collagen vasc dz given her esr =5 and does not appear to have  a pulmonary problem> will refer back to primary/ neuro for f/u

## 2013-04-24 NOTE — Progress Notes (Addendum)
Subjective:    Jennifer Luna ID: Jennifer Luna, female    DOB: December 04, 1990, 22 y.o.   MRN: [id number] This chart was scribed for Meredith Staggers, MD by Nicholos Johns, Medical Scribe. This Jennifer Luna's care was started at 3:23 PM.   HPI HPI COMMENTS:  Jennifer Luna is 22 y.o. female   Jennifer Luna seen 04/15/13 by Dr. Merla Riches with diffuse body aches, weakness, and fatigue approx. 1 month ago. When seen Jennifer Luna had multiple blood tests sent including sed rate, blood count, ANA, CMP, TSH, and lyme titers. All within normal range except low TSH at 0.272 but normal T4 at 1.411. WBC borderline low at 4.4. Seen in ER on 04/22/13 with weakness and SOB. Had negative D Dimer and normal CXR without active cardiopulmonary disease. Referred to pulmonary and followed up with neurology on 04/23/13. When seen by Dr. Vickey Huger yesterday, decreased Vimpat by 50 mg in the morning, but by discussion Jennifer Luna did not think this was causing Jennifer Luna symptoms, and was told it looked like a viral infection. Pulmonary evaluation with Dr. Sherene Sires yesterday showed a normal sed rate, Jennifer Luna did not appear to have pulmonary problem., and normal ESR,  Jennifer Luna had CT angiogram of chest yesterday that was negative for pulmonary embolism. WBC noted low at 3.3 with elevated lymphocytes at 48 on blood work 2 days ago. Unlikely collagen vasc dz with normal ESR. Suspected viral illness. .  Today presents to the office in follow up. fatigue and weakness 1 month ago. Jennifer Luna also reports SOB, onset for the past 2 weeks. SOB is worsened with movement. Jennifer Luna states fatigue first started at the back of the calves and hips with overall general weakness. The soreness has now moved into the upper arms and back. Jennifer Luna has been taking ibuprofen with little relief, and Magnesium, B12, & folic acid 1x/day. Jennifer Luna on Vimpat for seizure d/o, dosage was decreased yesterday. Neurology did not think current illness was related to seizures but still decreased dose due to some sedation. Jennifer Luna denies chest  pain, sore throat, fever, nausea, vomiting, trouble urinating, rash, problems with menses. Mother denies difficulty speaking or slurred speech/change in speech.  Jennifer Luna has no hx of depression or anxiety.  Interviewed with parent outside room but assistant present. Denied any social stressors or safety concerns at home or at work. Denied alcohol or IDU. Only taking Klonopin at bedtime. No extra doses recently. No new supplements expect magnesium(1x/day), B12(1x/day), and multivitamin over the counter.  Jennifer Luna Active Problem List   Diagnosis Date Noted  . Arthralgia 04/23/2013  . Shortness of breath 04/23/2013  . Intractable absence epilepsy 09/21/2012  . Pain in joint, shoulder region 09/21/2012   Past Medical History  Diagnosis Date  . Seizures     nx of absent and grand mal  . Thrombocytopenia 01/11/12    office notes from last visit, with zarontin and depakote  . Sleepiness 01/11/2012    notes from office visit  . Amenorrhea 01/11/2012    notes from office visit, PERIMENTRUAL PAINS  . Epilepsy     with absence seizures, abnormal EEG  . Sleepiness     DAYTIME  . Arthralgia 04/23/2013  . Shortness of breath 04/23/2013   Past Surgical History  Procedure Laterality Date  . Shoulder injury     Allergies  Allergen Reactions  . Penicillins Rash  . Sulfa Antibiotics Rash  . Erythromycin Rash   Prior to Admission medications   Medication Sig Start Date End Date Taking? Authorizing Provider  clonazePAM Scarlette Calico)  0.5 MG tablet Take 0.25 mg by mouth at bedtime.   Yes Historical Provider, MD  cyanocobalamin 1000 MCG tablet Take 100 mcg by mouth daily.   Yes Historical Provider, MD  folic acid (FOLVITE) 1 MG tablet Take 1 mg by mouth daily.   Yes Historical Provider, MD  ibuprofen (ADVIL,MOTRIN) 200 MG tablet Take 600 mg by mouth every 6 (six) hours as needed. For menstrual pain   Yes Historical Provider, MD  lacosamide (VIMPAT) 50 MG TABS tablet Take 2 tablets (100 mg total) by mouth 2  (two) times daily. In time 50 mg in AM and 2 of 50 mg in Pm. 04/23/13  Yes Carmen Dohmeier, MD  magnesium oxide (MAG-OX) 400 MG tablet Take 400 mg by mouth daily.   Yes Historical Provider, MD  simethicone (INFANTS SIMETHICONE) 40 MG/0.6ML drops Take 0.6 mLs (40 mg total) by mouth 4 (four) times daily as needed. 02/27/13  Yes Carmen Dohmeier, MD  topiramate (TOPAMAX) 100 MG tablet Take 1 tablet (100 mg total) by mouth 2 (two) times daily. Revised 04-23-13  Take one half in AM and 2 full tab in PM . 04/23/13  Yes Melvyn Novas, MD  [facility] FE 0.4-35 MG-MCG tablet Chew 1 tablet by mouth daily.  08/18/12  Yes Historical Provider, MD  zonisamide (ZONEGRAN) 25 MG capsule Take 2 capsules (50 mg total) by mouth 2 (two) times daily. 02/27/13  Yes Melvyn Novas, MD   History   Social History  . Marital Status: Single    Spouse Name: N/A    Number of Children: N/A  . Years of Education: N/A   Occupational History  . STUDENT/[employer]    Social History Main Topics  . Smoking status: Never Smoker   . Smokeless tobacco: Never Used  . Alcohol Use: No  . Drug Use: No  . Sexual Activity: Not on file   Other Topics Concern  . Not on file   Social History Narrative  . No narrative on file    Review of Systems  Constitutional: Positive for fatigue. Negative for fever.  HENT: Negative for sore throat.   Respiratory: Positive for shortness of breath.   Cardiovascular: Negative for chest pain.  Gastrointestinal: Negative for nausea and vomiting.  Genitourinary: Negative for difficulty urinating.  Skin: Negative for rash.  Neurological: Positive for weakness. Negative for speech difficulty.       Objective:   Physical Exam  Vitals reviewed. Constitutional: Jennifer Luna is oriented to person, place, and time. Jennifer Luna appears well-developed and well-nourished. No distress.  HENT:  Head: Normocephalic and atraumatic.  Right Ear: Hearing, tympanic membrane, external ear and ear canal normal.  Left Ear:  Hearing, tympanic membrane, external ear and ear canal normal.  Nose: Nose normal.  Mouth/Throat: Oropharynx is clear and moist. No oropharyngeal exudate.  No erythema  Eyes: Conjunctivae and EOM are normal. Pupils are equal, round, and reactive to light.  Neck:  2 small submandibular lymph nodes, no occipital or anterior cervical nodes  Cardiovascular: Normal rate, regular rhythm and intact distal pulses.   No murmur heard. No changes in symptoms with leaning forward. No chest pain.  Pulmonary/Chest: Effort normal and breath sounds normal. No respiratory distress. Jennifer Luna has no wheezes. Jennifer Luna has no rhonchi.  Abdominal: Soft. Jennifer Luna exhibits no distension. There is no tenderness.  No apparent hematosplenomegaly  Neurological: Jennifer Luna is alert and oriented to person, place, and time. Jennifer Luna has normal reflexes. Jennifer Luna displays a negative Romberg sign.  Reflex Scores:  Tricep reflexes are 2+ on the right side and 2+ on the left side.      Bicep reflexes are 2+ on the right side and 2+ on the left side.      Brachioradialis reflexes are 2+ on the right side and 2+ on the left side.      Patellar reflexes are 2+ on the right side and 2+ on the left side.      Achilles reflexes are 2+ on the right side and 2+ on the left side. Equal strength in upper and lower extremities bilaterally. Negative pronator drift. Non focal neurology exam.    Skin: Skin is warm and dry. No rash noted.  Psychiatric: Jennifer Luna has a normal mood and affect. Jennifer Luna behavior is normal.    Filed Vitals:   04/24/13 1359  BP: 102/68  Pulse: 74  Temp: 97.9 F (36.6 C)  TempSrc: Oral  Resp: 16  Height: 5\' 4"  (1.626 m)  Weight: 125 lb (56.7 kg)  SpO2: 100%   Ambulatory Pulse Ox: 2 laps. Lowest O2 Sat 99%. HR at 98.   Called pulmonary office - clarified that no desaturations yesterday as per their notes in system.   4:19 PM - further clarified hx - persistent fatigue, generalized feels weak but for past month and no progression of the  weakness symptoms. Reflexes 2+ and equal UE/LE bilaterally.   Results for orders placed in visit on 04/24/13  POCT CBC      Result Value Range   WBC 3.6 (*) 4.6 - 10.2 K/uL   Lymph, poc 1.8  0.6 - 3.4   POC LYMPH PERCENT 49.7  10 - 50 %L   MID (cbc) 0.2  0 - 0.9   POC MID % 5.4  0 - 12 %M   POC Granulocyte 1.6 (*) 2 - 6.9   Granulocyte percent 44.9  37 - 80 %G   RBC 4.85  4.04 - 5.48 M/uL   Hemoglobin 14.1  12.2 - 16.2 g/dL   HCT, POC 21.3  08.6 - 47.9 %   MCV 95.0  80 - 97 fL   MCH, POC 29.1  27 - 31.2 pg   MCHC 30.6 (*) 31.8 - 35.4 g/dL   RDW, POC 57.8     Platelet Count, POC 207  142 - 424 K/uL   MPV 11.1  0 - 99.8 fL      Assessment & Plan:   MAKESHA BELITZ is a 22 y.o. female Other malaise and fatigue - Plan: POCT CBC, Epstein-Barr virus VCA antibody panel, CMV IgM, Magnesium, Vitamin B12, Comprehensive metabolic panel  Myalgia - Plan: POCT CBC, Epstein-Barr virus VCA antibody panel, CMV IgM, CK, Comprehensive metabolic panel  DOE (dyspnea on exertion) - Plan: POCT CBC, Epstein-Barr virus VCA antibody panel, CMV IgM  Fatigue - Plan: POCT CBC, Epstein-Barr virus VCA antibody panel, CMV IgM, CK, Magnesium, Vitamin B12   As above, approximately 1 month of generalized weakness/fatigue/malaise, initially lower myalgias, now upper body myalgias. Agree that Jennifer Luna symotoms appear to be due to a viral illness, especially with low WBC, and lymphocytic predominance. No focal weakness, and generalized weakness has not been progressive, reflexes equal/intact - discussed unlikely Margarita Mail, and was evaluated yesterday by neurology, who also suspected viral syndrome. Vimpat was decreased slightly, but apparently did not feel this was contributor to current sx's. Prior labs noted, will add CMV and EBV testing, repeat CMP to see if LFT change, CK for myalgias, and as on supplementation - check  B12 and magnesium level, but suspect these will be ok. Plan on continued sx care, but follow up  in next week to recheck, sooner or ER if worse.    DOE- s.p negaive CT of chest for PE, lungs clear on exam with normal effort and repeated ambulatory pulse ox which was normal (verified normal ambulatory pulse ox from pulmonary yesterday). No murmur/rub/gallop on cardiac exam.  Offered cardiology eval or 2d echo, but Jennifer Luna and parent declined at present - they woul like to wait on above test results first, then decide on further workup.  Recheck with myself or Dr. Merla Riches in next week, sooner if worse, RTC/er precautions given.   No orders of the defined types were placed in this encounter.   Jennifer Luna Instructions  We will check mono and CMV tests, other blood tests as discussed. Let me know if you would like me to refer you to a cardiologist for these symptoms, but oxygen level ok here in office including with walking. If any progression of weakness, or other new changes to your symptoms - return here or emergency room. You should receive a call or letter about your lab results within the next week to 10 days. Plan on recheck with myself or Dr. Merla Riches in next 1 week. Sooner if worse as above.

## 2013-04-24 NOTE — Progress Notes (Signed)
I spoke to Missy Sabins CMA with Dr Sherene Sires,  to verify pulse oximetry in office with Dr Sherene Sires yesterday, since mother reports a drop in oxygen saturation. Upon ambulation the patients oxygen level did not drop. Lillia Abed verified the information within the patients chart is accurate and there was not a drop of oxygen saturation.

## 2013-04-24 NOTE — Telephone Encounter (Signed)
Spoke to Missy Sabins CMA with Dr Sherene Sires,  to verify pulse oximetry in office with Dr Sherene Sires yesterday. Upon ambulation the patients oxygen level did not drop. Lillia Abed verified the information within the patients chart is accurate and there was not a drop of oxygen saturation.  Dr Neva Seat advised.

## 2013-04-25 LAB — EPSTEIN-BARR VIRUS VCA ANTIBODY PANEL
EBV NA IgG: 442 U/mL — ABNORMAL HIGH (ref <18.0)
EBV VCA IgM: <10 U/mL (ref <36.0)

## 2013-04-25 LAB — CMV IGM: CMV IgM: <8 AU/mL (ref <30.00)

## 2013-07-16 ENCOUNTER — Telehealth: Payer: Self-pay | Admitting: *Deleted

## 2013-07-16 NOTE — Telephone Encounter (Signed)
Left a message to call the office back in the am.

## 2013-07-16 NOTE — Telephone Encounter (Signed)
Patient returning call, please call her back.

## 2013-07-16 NOTE — Telephone Encounter (Signed)
I called [facility] to schedule an appointment in April and left a message to call the office back.

## 2013-07-17 NOTE — Telephone Encounter (Signed)
An appointment was scheduled on September 10, 2013 with the VNS Rep- Meghan.

## 2013-08-28 ENCOUNTER — Ambulatory Visit: Payer: BC Managed Care – PPO | Admitting: Nurse Practitioner

## 2013-08-28 ENCOUNTER — Ambulatory Visit: Payer: BC Managed Care – PPO | Admitting: Neurology

## 2013-09-10 ENCOUNTER — Ambulatory Visit (INDEPENDENT_AMBULATORY_CARE_PROVIDER_SITE_OTHER): Payer: BC Managed Care – PPO | Admitting: Neurology

## 2013-09-10 ENCOUNTER — Encounter: Payer: Self-pay | Admitting: Neurology

## 2013-09-10 VITALS — BP 106/69 | HR 79 | Ht 64.5 in | Wt 128.0 lb

## 2013-09-10 DIAGNOSIS — G40319 Generalized idiopathic epilepsy and epileptic syndromes, intractable, without status epilepticus: Secondary | ICD-10-CM

## 2013-09-10 DIAGNOSIS — G40A19 Absence epileptic syndrome, intractable, without status epilepticus: Secondary | ICD-10-CM

## 2013-09-10 DIAGNOSIS — F458 Other somatoform disorders: Secondary | ICD-10-CM

## 2013-09-10 MED ORDER — ZONISAMIDE 25 MG PO CAPS: 50.0000 mg | ORAL_CAPSULE | Freq: Two times a day (BID) | ORAL | Status: DC

## 2013-09-10 MED ORDER — CLONAZEPAM 0.5 MG PO TABS: 0.2500 mg | ORAL_TABLET | Freq: Every day | ORAL | Status: DC

## 2013-09-10 MED ORDER — LACOSAMIDE 50 MG PO TABS: 100.0000 mg | ORAL_TABLET | Freq: Two times a day (BID) | ORAL | Status: DC

## 2013-09-10 MED ORDER — TOPIRAMATE 100 MG PO TABS: 100.0000 mg | ORAL_TABLET | Freq: Two times a day (BID) | ORAL | Status: DC

## 2013-09-10 NOTE — Progress Notes (Signed)
8

## 2013-09-10 NOTE — Progress Notes (Signed)
[facility]  Provider:  Melvyn Novasarmen  Rossana Molchan, M D  Referring Provider: Tonye Pearsonoolittle, Robert P, MD Primary Care Physician:  Dr. Cleta Albertsaub   Chief Complaint  Patient presents with  . Follow-up    Room 11  . Seizures      :  Jennifer Luna is a 23 y.o. female  Is seen here as a  revisit  from Dr. Cleta Albertsaub. She has been one year seizure free and driving. She still has occasional little absence "blurps" but she has not had the loss of conversational flow and the secondary generalization. Last seizure in 2013 , April.       She's taking 2 Vimpat 100 mg by mouth twice a day, she's taking Topiramate 100 mg bid po. She's taking Klonopin or 0.5 mg a half tablet by mouth at bedtime. She has  been informed about a VNS treatment option.      Review of Systems: Out of a complete 14 system review, the patient complains of only the following symptoms, and all other reviewed systems are negative.  "staring seizures", right shoulder pain since a seizure related fall. ROM limitation.  She feels her sleep is less restorative.  History   Social History  . Marital Status: Single    Spouse Name: N/A    Number of Children: 0  . Years of Education: 12   Occupational History  . STUDENT/[employer]   . STUDENT    Social History Main Topics  . Smoking status: Never Smoker   . Smokeless tobacco: Never Used  . Alcohol Use: No  . Drug Use: No  . Sexual Activity: Not on file   Other Topics Concern  . Not on file   Social History Narrative   Patient is single and lives at home with her parents.   Patient is working part-time.   Patient has a high school education.   Patient is right-handed.   Patient drinks two cups of coffee.    Family History  Problem Relation Age of Onset  . Hypothyroidism Mother   . Arthritis Mother   . Hypertension Father   . Diabetes Father   . Breast cancer Maternal Grandmother   . Heart disease Maternal Grandfather   . Heart disease Paternal Grandmother    . Lupus Maternal Aunt   . Seizures Paternal Uncle     as a child  . Asthma Sister   . Rheum arthritis Maternal Aunt   . Rheum arthritis Paternal Grandmother   . Asthma      Maternal [employer] Uncle   . Asthma Sister     had as a child    Past Medical History  Diagnosis Date  . Seizures     nx of absent and grand mal  . Thrombocytopenia 01/11/12    office notes from last visit, with zarontin and depakote  . Sleepiness 01/11/2012    notes from office visit  . Amenorrhea 01/11/2012    notes from office visit, PERIMENTRUAL PAINS  . Epilepsy     with absence seizures, abnormal EEG  . Sleepiness     DAYTIME  . Arthralgia 04/23/2013  . Shortness of breath 04/23/2013    Past Surgical History  Procedure Laterality Date  . Shoulder injury      Current Outpatient Prescriptions  Medication Sig Dispense Refill  . clonazePAM (KLONOPIN) 0.5 MG tablet Take 0.25 mg by mouth at bedtime.      . cyanocobalamin 1000 MCG tablet Take 100 mcg by mouth daily.      .Marland Kitchen  folic acid (FOLVITE) 1 MG tablet Take 1 mg by mouth daily.      Marland Kitchen. ibuprofen (ADVIL,MOTRIN) 200 MG tablet Take 600 mg by mouth every 6 (six) hours as needed. For menstrual pain      . lacosamide (VIMPAT) 50 MG TABS tablet Take 2 tablets (100 mg total) by mouth 2 (two) times daily. In time 50 mg in AM and 2 of 50 mg in Pm.  360 tablet  3  . magnesium oxide (MAG-OX) 400 MG tablet Take 400 mg by mouth daily.      . simethicone (INFANTS SIMETHICONE) 40 MG/0.6ML drops Take 0.6 mLs (40 mg total) by mouth 4 (four) times daily as needed.  30 mL  0  . topiramate (TOPAMAX) 100 MG tablet Take 1 tablet (100 mg total) by mouth 2 (two) times daily. Revised 04-23-13  Take one half in AM and 2 full tab in PM .  180 tablet  3  . WYMZYA FE 0.4-35 MG-MCG tablet Chew 1 tablet by mouth daily.       Marland Kitchen. zonisamide (ZONEGRAN) 25 MG capsule Take 2 capsules (50 mg total) by mouth 2 (two) times daily.  360 capsule  3   No current facility-administered medications  for this visit.    Allergies as of 09/10/2013 - Review Complete 09/10/2013  Allergen Reaction Noted  . Penicillins Rash 06/11/2011  . Sulfa antibiotics Rash 06/11/2011  . Erythromycin Rash 06/11/2011    Vitals: BP 106/69  Pulse 79  Ht 5' 4.5" (1.638 m)  Wt 128 lb (58.06 kg)  BMI 21.64 kg/m2  LMP 09/09/2013 Last Weight:  Wt Readings from Last 1 Encounters:  09/10/13 128 lb (58.06 kg)   Last Height:   Ht Readings from Last 1 Encounters:  09/10/13 5' 4.5" (1.638 m)    Physical exam:Head: Normocephalic, atraumatic. Neck is supple. Mallampati1,    Cardiovascular: Regular rate and rhythm without murmurs or carotid bruit, and without distended neck veins.  Respiratory: Lungs are clear to auscultation.  Skin: Without evidence of edema, or rash  Trunk: BMI is normal .  Neurologic exam :  The patient is awake and alert, oriented to place and time.  Memory subjective described as intact  There is a normal attention span & concentration ability. Speech is fluent without dysarthria, dysphonia , aphasia. Mood and affect are appropriate.  Cranial nerves:  Pupils are equal and briskly reactive to light. Funduscopic exam without evidence of pallor or edema. Extraocular movements in vertical and horizontal planes intact and with an endpoint nystagmus. Visual fields by finger perimetry are intact.  Hearing to finger rub intact. Facial sensation intact to fine touch. Facial motor strength is symmetric and tongue and uvula move midline.  Motor exam: Normal tone and normal muscle bulk and symmetric normal strength in all extremities. Grip strength is normal.  Sensory: Fine touch, pinprick and vibration were tested in all extremities.  Proprioception is tested in the upper extremities only. This was normal.  Coordination: Rapid alternating movements in the fingers/hands is tested and normal.  Finger-to-nose maneuver tested and normal without evidence of ataxia, dysmetria or tremor.  Gait and  station: Patient walks without assistive device and is able unassisted to climb up to the exam table.  Strength within normal limits. Stance is stable and normal.  Steps are unfragmented.  Deep tendon reflexes: in the upper and lower extremities are brisk -symmetric and intact. Down going  babinski.      1)  Primary absence seizures , controlled  on VIMPAT , Zonogran and Topiramate.  24 month seizure free.  Klonopin is more a sleeper than a seizure medication.   Plan:  Treatment plan and additional workup : refills only .    VIMPAT by 50 mg in AM 100 mg at PM .

## 2013-09-10 NOTE — Addendum Note (Signed)
Addended by: Melvyn NovasHMEIER, Gaynor Ferreras on: 09/10/2013 11:43 AM   Modules accepted: Orders

## 2013-10-21 ENCOUNTER — Other Ambulatory Visit (INDEPENDENT_AMBULATORY_CARE_PROVIDER_SITE_OTHER): Payer: Self-pay

## 2013-10-21 DIAGNOSIS — Z0289 Encounter for other administrative examinations: Secondary | ICD-10-CM

## 2013-10-22 LAB — COMPREHENSIVE METABOLIC PANEL
ALK PHOS: 42 IU/L (ref 39–117)
ALT: 16 IU/L (ref 0–32)
AST: 15 IU/L (ref 0–40)
Albumin/Globulin Ratio: 2.3 (ref 1.1–2.5)
Albumin: 4.6 g/dL (ref 3.5–5.5)
BUN / CREAT RATIO: 15 (ref 8–20)
BUN: 12 mg/dL (ref 6–20)
CALCIUM: 9.2 mg/dL (ref 8.7–10.2)
CHLORIDE: 105 mmol/L (ref 97–108)
CO2: 19 mmol/L (ref 18–29)
CREATININE: 0.82 mg/dL (ref 0.57–1.00)
GFR calc Af Amer: 117 mL/min/{1.73_m2} (ref >59)
GFR calc non Af Amer: 102 mL/min/{1.73_m2} (ref >59)
GLOBULIN, TOTAL: 2 g/dL (ref 1.5–4.5)
Glucose: 74 mg/dL (ref 65–99)
Potassium: 4.6 mmol/L (ref 3.5–5.2)
Sodium: 140 mmol/L (ref 134–144)
Total Bilirubin: <0.2 mg/dL (ref 0.0–1.2)
Total Protein: 6.6 g/dL (ref 6.0–8.5)

## 2013-10-30 ENCOUNTER — Encounter: Payer: Self-pay | Admitting: *Deleted

## 2013-10-30 ENCOUNTER — Ambulatory Visit: Payer: BC Managed Care – PPO | Admitting: Neurology

## 2013-10-30 NOTE — Progress Notes (Signed)
Quick Note:  Shared results,verbalized understanding and requested copy,mailed ______

## 2013-11-02 ENCOUNTER — Other Ambulatory Visit: Payer: Self-pay | Admitting: Neurology

## 2013-11-04 NOTE — Telephone Encounter (Signed)
Last OV from May says: VIMPAT by 50 mg in AM 100 mg at PM

## 2013-11-04 NOTE — Telephone Encounter (Signed)
Rx signed and faxed.

## 2013-11-07 ENCOUNTER — Other Ambulatory Visit: Payer: Self-pay | Admitting: Gastroenterology

## 2013-11-07 DIAGNOSIS — R1033 Periumbilical pain: Secondary | ICD-10-CM

## 2013-11-14 ENCOUNTER — Other Ambulatory Visit: Payer: BC Managed Care – PPO

## 2013-11-14 ENCOUNTER — Other Ambulatory Visit: Payer: Self-pay | Admitting: Neurology

## 2013-11-20 ENCOUNTER — Telehealth: Payer: Self-pay | Admitting: Neurology

## 2013-11-20 NOTE — Telephone Encounter (Signed)
Patient calling for refill of Vimpat. [facility] uses [facility] [address]. Please call to advise.

## 2013-11-20 NOTE — Telephone Encounter (Signed)
This Rx was already sent on 06/22.  I called the pharmacy.  Spoke with Jill AlexandersJustin.  He said they do have the Rx, but had not filled it yet.  They will fill it today.  They will call the patient when it's ready for pick up.  I called the patient back, got no answer.  Left message.

## 2013-11-21 ENCOUNTER — Other Ambulatory Visit: Payer: BC Managed Care – PPO

## 2014-01-24 ENCOUNTER — Telehealth: Payer: Self-pay | Admitting: Neurology

## 2014-01-24 NOTE — Telephone Encounter (Signed)
[facility] stated [facility] couldn't [facility] Rx's for lacosamide (VIMPAT) 50 MG TABS tablet and clonazePAM (KLONOPIN) 0.5 MG tablet until Monday.  Patient questioning if she could get samples.  Please anytime and call mom at [phone] if patient isn't available.

## 2014-01-24 NOTE — Telephone Encounter (Signed)
[facility] called [facility].  Spoke with Ree Kida.  He said they cannot fill the Klonopin because the patient picked up a 30 day supply on 08/27.  Says he spoke to mom and they told the pharmacy they never picked up the meds, but the pharmacy pulled a copy of the signed log, and the patient verified it was her signature, so they did in fact pick up the Rx.  They claim she is completley out and has to have another refill today.  Regarding the Vimpat, they did not have the med in stock, but have been able to locate it, and said they were going to call the patient and let them know it can be filled.  I called the patient back.  Said she found her Klonopin, and did not need the Rx now.  Her concern was the Vimpat.  I explained the pharmacy said they were able to locate the meds.  Her mom then got on the phone.  She did not feel they were able to locate the meds that quickly because she just spoke with them 5 minutes ago.  She then asked for the pharmacist name and my name and said she was going to call the pharmacy about Vimpat.  They will call us back if anything further is needed.

## 2014-03-05 ENCOUNTER — Telehealth: Payer: Self-pay | Admitting: *Deleted

## 2014-03-05 NOTE — Telephone Encounter (Signed)
Form,[employer] [place] to [place] to be completed 03-05-14.

## 2014-03-06 DIAGNOSIS — Z0289 Encounter for other administrative examinations: Secondary | ICD-10-CM

## 2014-03-07 NOTE — Telephone Encounter (Signed)
Form completed and signed.  Spoke to mother and let her know that will be faxed on Monday.  She verbalized understanding.

## 2014-03-07 NOTE — Telephone Encounter (Signed)
Mother calling to check status of Benefits form.  Needs to completed as soon as possible, patient insurance will be cancelled within 5 days if haven't received.  Please call # [phone] Terance Ice(Donna Yerger).

## 2014-03-10 ENCOUNTER — Telehealth: Payer: Self-pay | Admitting: *Deleted

## 2014-03-10 NOTE — Telephone Encounter (Signed)
Form,[employer] Benefits received from Nurse 03-10-14 faxed.

## 2014-03-13 ENCOUNTER — Other Ambulatory Visit: Payer: Self-pay

## 2014-03-14 MED ORDER — CLONAZEPAM 0.5 MG PO TABS: 0.2500 mg | ORAL_TABLET | Freq: Every day | ORAL | Status: DC

## 2014-03-17 ENCOUNTER — Telehealth: Payer: Self-pay | Admitting: Neurology

## 2014-03-17 NOTE — Telephone Encounter (Signed)
Patient's mother calling to state that patient's pharmacy still hasn't received clonazepam script, patient is completely out and she needs it today, please call mother when script has been sent.

## 2014-03-17 NOTE — Telephone Encounter (Signed)
Rx has already been faxed.  I called the pharmacy.  They do have the Rx, however, they have not filled it yet.  They will process the Rx and call patient when it's ready.  I called the patient back.  They are aware Rx was sent.  Will call us back if anything further is needed.

## 2014-03-17 NOTE — Telephone Encounter (Signed)
Patient talked to her pharmacy and they have not received any Rx for clonazepam from [facility].  She uses [facility] on [address] [phone].

## 2014-04-21 ENCOUNTER — Ambulatory Visit: Payer: BC Managed Care – PPO | Admitting: Neurology

## 2014-04-28 ENCOUNTER — Encounter: Payer: Self-pay | Admitting: Neurology

## 2014-04-28 ENCOUNTER — Ambulatory Visit (INDEPENDENT_AMBULATORY_CARE_PROVIDER_SITE_OTHER): Payer: BC Managed Care – PPO | Admitting: Neurology

## 2014-04-28 VITALS — BP 113/70 | HR 76 | Resp 12 | Ht 64.75 in | Wt 131.5 lb

## 2014-04-28 DIAGNOSIS — G40909 Epilepsy, unspecified, not intractable, without status epilepticus: Secondary | ICD-10-CM | POA: Insufficient documentation

## 2014-04-28 DIAGNOSIS — G40A09 Absence epileptic syndrome, not intractable, without status epilepticus: Secondary | ICD-10-CM

## 2014-04-28 MED ORDER — TOPIRAMATE ER 150 MG PO SPRINKLE CAP24: 150.0000 mg | EXTENDED_RELEASE_CAPSULE | ORAL | Status: DC

## 2014-04-28 MED ORDER — CLONAZEPAM 0.5 MG PO TABS: 0.2500 mg | ORAL_TABLET | Freq: Every day | ORAL | Status: DC

## 2014-04-28 MED ORDER — TOPIRAMATE 100 MG PO TABS: ORAL_TABLET | ORAL | Status: DC

## 2014-04-28 MED ORDER — ZONISAMIDE 25 MG PO CAPS: 50.0000 mg | ORAL_CAPSULE | Freq: Two times a day (BID) | ORAL | Status: DC

## 2014-04-28 MED ORDER — LACOSAMIDE 50 MG PO TABS: ORAL_TABLET | ORAL | Status: DC

## 2014-04-28 NOTE — Patient Instructions (Signed)
Lacosamide tablets What is this medicine? LACOSAMIDE (la KOE sa mide) is used to control seizures caused by certain types of epilepsy. This medicine may be used for other purposes; ask your health care provider or pharmacist if you have questions. COMMON BRAND NAME(S): Vimpat What should I tell my health care provider before I take this medicine? They need to know if you have any of these conditions: -dehydration -heart disease, including heart failure -history of a drug or alcohol abuse problem -kidney disease -liver disease -suicidal thoughts, plans, or attempt; a previous suicide attempt by you or a family member -an unusual or allergic reaction to lacosamide, other medicines, foods, dyes, or preservatives -pregnant or trying to get pregnant -breast-feeding How should I use this medicine? Take this medicine by mouth with a glass of water. You can take it with or without food. Follow the directions on the prescription label. Take your doses at regular intervals. Do not take your medicine more often than directed. Do not stop taking except on the advice of your doctor or health care professional. A special MedGuide will be given to you by the pharmacist with each prescription and refill. Be sure to read this information carefully each time. Talk to your pediatrician regarding the use of this medicine in children. Special care may be needed. Overdosage: If you think you have taken too much of this medicine contact a poison control center or emergency room at once. NOTE: This medicine is only for you. Do not share this medicine with others. What if I miss a dose? If you miss a dose, take it as soon as you can. If it is almost time for your next dose, take only that dose. Do not take double or extra doses. What may interact with this medicine? -atazanavir -beta-blockers like metoprolol and propranolol -calcium channel blockers like diltiazem and  verapamil -digoxin -dronedarone -lopinavir/ritonavir This list may not describe all possible interactions. Give your health care provider a list of all the medicines, herbs, non-prescription drugs, or dietary supplements you use. Also tell them if you smoke, drink alcohol, or use illegal drugs. Some items may interact with your medicine. What should I watch for while using this medicine? Visit your doctor or health care professional for regular checks on your progress. This medicine needs careful monitoring. Wear a medical ID bracelet or chain, and carry a card that describes your disease and details of your medicine and dosage times. You may get drowsy or dizzy. Do not drive, use machinery, or do anything that needs mental alertness until you know how this medicine affects you. Do not stand or sit up quickly, especially if you are an older patient. This reduces the risk of dizzy or fainting spells. Alcohol may interfere with the effect of this medicine. Avoid alcoholic drinks. The use of this medicine may increase the chance of suicidal thoughts or actions. Pay special attention to how you are responding while on this medicine. Any worsening of mood, or thoughts of suicide or dying should be reported to your health care professional right away. Women who become pregnant while using this medicine may enroll in the [place] Antiepileptic Drug Pregnancy Registry by calling [phone]. This registry collects information about the safety of antiepileptic drug use during pregnancy. What side effects may I notice from receiving this medicine? Side effects that you should report to your doctor or health care professional as soon as possible: -allergic reactions like skin rash, itching or hives, swelling of the face, lips, or  tongue -confusion -feeling faint or lightheaded, falls -fever -irregular heart beat -loss of memory -suicidal thoughts or other mood changes -unusually weak or  tired -yellowing of the eyes, skin Side effects that usually do not require medical attention (report to your doctor or health care professional if they continue or are bothersome): -constipation -diarrhea -drowsiness -dry mouth -headache -nausea This list may not describe all possible side effects. Call your doctor for medical advice about side effects. You may report side effects to FDA at 1-800-FDA-1088. Where should I keep my medicine? Keep out of the reach of children. This medicine can be abused. Keep your medicine in a safe place to protect it from theft. Do not share this medicine with anyone. Selling or giving away this medicine is dangerous and against the law. Store at room temperature between 15 and 30 degrees C (59 and 86 degrees F). Throw away any unused medicine after the expiration date. NOTE: This sheet is a summary. It may not cover all possible information. If you have questions about this medicine, talk to your doctor, pharmacist, or health care provider.  2015, Elsevier/Gold Standard. (2009-12-29 21:02:36)

## 2014-04-28 NOTE — Addendum Note (Signed)
Addended by: Melvyn NovasHMEIER, Amylynn Fano on: 04/28/2014 10:23 AM   Modules accepted: Orders, Medications

## 2014-04-28 NOTE — Progress Notes (Signed)
[facility]  Provider:  Melvyn Novasarmen  Rexford Prevo, M D  Referring Provider: Tonye Pearsonoolittle, Robert P, MD Primary Care Physician:  Dr. Cleta Albertsaub   Chief Complaint  Patient presents with  . RV seizures    Rm 11,       Jennifer Luna ClimesFairfax is a 23 y.o. female  Is seen here as a  revisit  from Dr. Cleta Albertsaub. She has been one year seizure free and driving. She still has occasional little absence "blurps" but she has not had the loss of conversational flow and the secondary generalization. Last seizure in 2013 , April.  Driving to work, has 2 part time jobs.   She's taking 2 Vimpat 100 mg by mouth twice a day, she's taking Topiramate 100 mg bid po.  She's taking Klonopin or 0.5 mg a half tablet by mouth at bedtime.  She has been informed about a VNS treatment option, she doesn't need now as she doing so well.      Review of Systems: Out of a complete 14 system review, the patient complains of only the following symptoms, and all other reviewed systems are negative.  "staring seizures", right shoulder pain since a seizure related fall. ROM limitation.  She feels her sleep is less restorative.  History   Social History  . Marital Status: Single    Spouse Name: N/A    Number of Children: 0  . Years of Education: 12   Occupational History  . STUDENT/[employer]   . STUDENT    Social History Main Topics  . Smoking status: Never Smoker   . Smokeless tobacco: Never Used  . Alcohol Use: No  . Drug Use: No  . Sexual Activity: Not on file   Other Topics Concern  . Not on file   Social History Narrative   Patient is single and lives at home with her parents.   Patient is working part-time.   Patient has a high school education.   Patient is right-handed.   Patient drinks two cups of coffee.    Family History  Problem Relation Age of Onset  . Hypothyroidism Mother   . Arthritis Mother   . Hypertension Father   . Diabetes Father   . Breast cancer Maternal Grandmother   . Heart  disease Maternal Grandfather   . Heart disease Paternal Grandmother   . Lupus Maternal Aunt   . Seizures Paternal Uncle     as a child  . Asthma Sister   . Rheum arthritis Maternal Aunt   . Rheum arthritis Paternal Grandmother   . Asthma      Maternal [employer] Uncle   . Asthma Sister     had as a child    Past Medical History  Diagnosis Date  . Seizures     nx of absent and grand mal  . Thrombocytopenia 01/11/12    office notes from last visit, with zarontin and depakote  . Sleepiness 01/11/2012    notes from office visit  . Amenorrhea 01/11/2012    notes from office visit, PERIMENTRUAL PAINS  . Epilepsy     with absence seizures, abnormal EEG  . Sleepiness     DAYTIME  . Arthralgia 04/23/2013  . Shortness of breath 04/23/2013    Past Surgical History  Procedure Laterality Date  . Shoulder injury      Current Outpatient Prescriptions  Medication Sig Dispense Refill  . CAMRESE 0.15-0.03 &0.01 MG tablet Take 1 tablet by mouth daily.  3  . clonazePAM (KLONOPIN)  0.5 MG tablet Take 0.5 tablets (0.25 mg total) by mouth at bedtime. 45 tablet 1  . cyanocobalamin 1000 MCG tablet Take 100 mcg by mouth daily.    . folic acid (FOLVITE) 1 MG tablet Take 1 mg by mouth daily.    Marland Kitchen ibuprofen (ADVIL,MOTRIN) 200 MG tablet Take 600 mg by mouth every 6 (six) hours as needed. For menstrual pain    . lacosamide (VIMPAT) 50 MG TABS tablet 50 mg in AM 100 mg at PM 270 tablet 1  . simethicone (INFANTS SIMETHICONE) 40 MG/0.6ML drops Take 0.6 mLs (40 mg total) by mouth 4 (four) times daily as needed. 30 mL 0  . topiramate (TOPAMAX) 100 MG tablet Take 1 tablet (100 mg total) by mouth 2 (two) times daily. Revised 04-23-13  Take one half in AM and 2 full tab in PM . 180 tablet 3  . zonisamide (ZONEGRAN) 25 MG capsule Take 2 capsules (50 mg total) by mouth 2 (two) times daily. 360 capsule 3   No current facility-administered medications for this visit.    Allergies as of 04/28/2014 - Review Complete  04/28/2014  Allergen Reaction Noted  . Penicillins Rash 06/11/2011  . Sulfa antibiotics Rash 06/11/2011  . Erythromycin Rash 06/11/2011    Vitals: BP 113/70 mmHg  Pulse 76  Resp 12  Ht 5' 4.75" (1.645 m)  Wt 131 lb 8 oz (59.648 kg)  BMI 22.04 kg/m2  LMP 02/26/2014 Last Weight:  Wt Readings from Last 1 Encounters:  04/28/14 131 lb 8 oz (59.648 kg)   Last Height:   Ht Readings from Last 1 Encounters:  04/28/14 5' 4.75" (1.645 m)    Physical exam:Head: Normocephalic, atraumatic. Neck is supple. Mallampati1,    Cardiovascular: Regular rate and rhythm without murmurs or carotid bruit, and without distended neck veins.  Respiratory: Lungs are clear to auscultation.  Skin: Without evidence of edema, or rash  Trunk: BMI is normal .  Neurologic exam :  The patient is awake and alert, oriented to place and time.  Memory subjective described as intact.  There is a normal attention span & concentration ability.  Speech is fluent without dysarthria, dysphonia , aphasia. Mood and affect are appropriate.  Cranial nerves:  Pupils are equal and briskly reactive to light. Funduscopic exam without evidence of pallor or edema.  Extraocular movements in vertical and horizontal planes intact and with an endpoint nystagmus.  Visual fields by finger perimetry are intact.  Hearing to finger rub intact. Facial sensation intact to fine touch.  Facial motor strength is symmetric and tongue and uvula move midline.  Motor exam: Normal tone and normal muscle bulk and symmetric normal strength in all extremities.  Grip strength is normal.  Sensory: Fine touch, pinprick and vibration were tested in all extremities.  Proprioception is tested in the upper extremities only. This was normal.  Coordination: Rapid alternating movements in the fingers/hands is tested and normal.  Finger-to-nose maneuver tested and normal without evidence of ataxia, dysmetria or tremor.  Gait and station: Patient walks without  assistive device and is able unassisted to climb up to the exam table.  Strength within normal limits. Stance is stable and normal. Steps are unfragmented.  Deep tendon reflexes: in the upper and lower extremities are brisk -symmetric and intact.  Downgoing Babinski.      1)  Primary absence seizures , controlled on VIMPAT , Zonogran and Topiramate.  32 month seizure free.        Klonopin is more a  sleeper than a seizure medication.        Patient is allowed to drive.    Plan:  Treatment plan and additional workup :   refills only - she is doing very well.    VIMPAT by 50 mg in AM 100 mg at PM .  Mrs. AcipHex received a letter from [employer] dated 03/31/2014 [address] [address], [place], [place] [id number]. The letter stated that from 05/16/2014 on Vimpat were no longer be covered under the plantar or change to a nonpreferred tier with a high out-of-pocket cost. There is no generic available for Vimpat to my knowledge and the patient has become from intractable epilepsy controlled epileptic under the treatment by Vimpat. itis not medically responsible to change the patient to another medication -   She could easily have seizures again. Even a change in  dosing or to a generic formulation, ( if this would be available), is not medically indicated.  Kaleia Longhi, MD

## 2014-05-05 ENCOUNTER — Telehealth: Payer: Self-pay | Admitting: Neurology

## 2014-05-05 NOTE — Telephone Encounter (Signed)
The insurance wanted patient's clinical info for PA because Vimapt is not preferred on the drug plan for 2016.  All requested info was already sent, and the request is currently under review.  They will notify the patient of an outcome once decision has been made.  I called back.  Mom wants us to send additional info to alternate address: [employer] [address] [place], [place]. [id number].

## 2014-05-05 NOTE — Telephone Encounter (Signed)
Patient's Mother is calling. She is following up on a letter that was to be sent to [employer] regarding Vimpat. Please call.

## 2014-05-23 ENCOUNTER — Telehealth: Payer: Self-pay | Admitting: *Deleted

## 2014-05-27 ENCOUNTER — Telehealth: Payer: Self-pay | Admitting: Neurology

## 2014-05-27 NOTE — Telephone Encounter (Signed)
I called back.  Spoke with Ms Jennifer Luna.  She will bring in the proof of income that is missing for the enrollment.

## 2014-05-27 NOTE — Telephone Encounter (Signed)
Info has been received and faxed to program.  I spoke with Ms Piedad ClimesFairfax.  She is aware.

## 2014-05-27 NOTE — Telephone Encounter (Signed)
Forwarded to Principal FinancialJessica in pharmacy.

## 2014-05-27 NOTE — Telephone Encounter (Signed)
Pt's mom is calling back stating she was wanting to pick up a form she dropped off to get assistance for Vimpat.  She needs this today if at all possible.  Please call back and advise as soon as possible.

## 2014-06-05 ENCOUNTER — Telehealth: Payer: Self-pay | Admitting: Neurology

## 2014-06-05 NOTE — Telephone Encounter (Signed)
I called patient assist at [phone].  Spoke with DIRECTVJennifer.  She said they did receive the paperwork, however, the enrollment is pending.  I called Ms Jennifer Luna Luna back.  She is aware.

## 2014-06-05 NOTE — Telephone Encounter (Signed)
Pt's mother is calling back regarding patient assistance, wanting to get an update on status.  Please call and advise.

## 2014-06-23 ENCOUNTER — Encounter (HOSPITAL_COMMUNITY): Payer: Self-pay | Admitting: Emergency Medicine

## 2014-06-23 ENCOUNTER — Emergency Department (HOSPITAL_COMMUNITY): Payer: BLUE CROSS/BLUE SHIELD

## 2014-06-23 ENCOUNTER — Emergency Department (HOSPITAL_COMMUNITY)
Admission: EM | Admit: 2014-06-23 | Discharge: 2014-06-23 | Disposition: A | Discharge status: Home / Self Care | Payer: BLUE CROSS/BLUE SHIELD | Attending: Emergency Medicine | Admitting: Emergency Medicine

## 2014-06-23 DIAGNOSIS — Z79899 Other long term (current) drug therapy: Secondary | ICD-10-CM | POA: Diagnosis not present

## 2014-06-23 DIAGNOSIS — Z3202 Encounter for pregnancy test, result negative: Secondary | ICD-10-CM | POA: Insufficient documentation

## 2014-06-23 DIAGNOSIS — Z8739 Personal history of other diseases of the musculoskeletal system and connective tissue: Secondary | ICD-10-CM | POA: Insufficient documentation

## 2014-06-23 DIAGNOSIS — R0789 Other chest pain: Secondary | ICD-10-CM

## 2014-06-23 DIAGNOSIS — Z880 Allergy status to penicillin: Secondary | ICD-10-CM | POA: Diagnosis not present

## 2014-06-23 DIAGNOSIS — R079 Chest pain, unspecified: Secondary | ICD-10-CM | POA: Diagnosis present

## 2014-06-23 DIAGNOSIS — Z8742 Personal history of other diseases of the female genital tract: Secondary | ICD-10-CM | POA: Diagnosis not present

## 2014-06-23 DIAGNOSIS — D696 Thrombocytopenia, unspecified: Secondary | ICD-10-CM | POA: Insufficient documentation

## 2014-06-23 DIAGNOSIS — G40909 Epilepsy, unspecified, not intractable, without status epilepticus: Secondary | ICD-10-CM | POA: Diagnosis not present

## 2014-06-23 LAB — PREGNANCY, URINE: PREG TEST UR: NEGATIVE

## 2014-06-23 MED ORDER — IBUPROFEN 800 MG PO TABS
800.0000 mg | ORAL_TABLET | Freq: Once | ORAL | Status: AC
Start: 2014-06-23 — End: 2014-06-23
  Administered 2014-06-23: 800 mg via ORAL
  Filled 2014-06-23: qty 1

## 2014-06-23 MED ORDER — IBUPROFEN 600 MG PO TABS: 600.0000 mg | ORAL_TABLET | Freq: Four times a day (QID) | ORAL | Status: DC | PRN

## 2014-06-23 NOTE — ED Notes (Signed)
Pt c/o intermittent central chest pressure and popping over the past couple of days.  Pt states that the popping noise you can hear sounds like when you pop your knuckles.  Pt states that yesterday morning when she got up to go eat breakfast she couldn't finish pulling up her pants because the pain was really bad esp when she was bending over and then pain got bad again around 430pm at church after she was sitting down and then trying to stand up.  This morning it hurt when she was getting up and hurt to turn from side to side.

## 2014-06-23 NOTE — ED Notes (Signed)
MD at bedside. 

## 2014-06-23 NOTE — ED Notes (Addendum)
Provider has instructed to discontinue labs

## 2014-06-23 NOTE — ED Notes (Signed)
Per EDP d/c labs

## 2014-06-23 NOTE — ED Provider Notes (Signed)
CSN: [id number]     Arrival date & time 06/23/14  [id number] History   First MD Initiated Contact with Patient 06/23/14 0930     Chief Complaint  Patient presents with  . Chest Pain     (Consider location/radiation/quality/duration/timing/severity/associated sxs/prior Treatment) Patient is a 24 y.o. female presenting with chest pain. The history is provided by the patient.  Chest Pain Pain location:  Substernal area Pain quality: sharp   Pain radiates to:  Does not radiate Pain radiates to the back: no   Pain severity:  Moderate Onset quality:  Sudden Timing:  Intermittent Progression:  Unchanged Chronicity:  Recurrent Context: lifting, movement and raising an arm   Relieved by:  Nothing Worsened by:  Nothing tried Ineffective treatments:  None tried Associated symptoms: no abdominal pain, no anorexia, no anxiety, no back pain, no cough, no fever, no lower extremity edema, no shortness of breath and not vomiting     Past Medical History  Diagnosis Date  . Seizures     nx of absent and grand mal  . Thrombocytopenia 01/11/12    office notes from last visit, with zarontin and depakote  . Sleepiness 01/11/2012    notes from office visit  . Amenorrhea 01/11/2012    notes from office visit, PERIMENTRUAL PAINS  . Epilepsy     with absence seizures, abnormal EEG  . Sleepiness     DAYTIME  . Arthralgia 04/23/2013  . Shortness of breath 04/23/2013   Past Surgical History  Procedure Laterality Date  . Shoulder injury     Family History  Problem Relation Age of Onset  . Hypothyroidism Mother   . Arthritis Mother   . Hypertension Father   . Diabetes Father   . Breast cancer Maternal Grandmother   . Heart disease Maternal Grandfather   . Heart disease Paternal Grandmother   . Lupus Maternal Aunt   . Seizures Paternal Uncle     as a child  . Asthma Sister   . Rheum arthritis Maternal Aunt   . Rheum arthritis Paternal Grandmother   . Asthma      Maternal [employer] Uncle   .  Asthma Sister     had as a child   History  Substance Use Topics  . Smoking status: Never Smoker   . Smokeless tobacco: Never Used  . Alcohol Use: No   OB History    No data available     Review of Systems  Constitutional: Negative for fever.  Respiratory: Negative for cough and shortness of breath.   Cardiovascular: Positive for chest pain.  Gastrointestinal: Negative for vomiting, abdominal pain and anorexia.  Musculoskeletal: Negative for back pain.  All other systems reviewed and are negative.     Allergies  Penicillins; Sulfa antibiotics; and Erythromycin  Home Medications   Prior to Admission medications   Medication Sig Start Date End Date Taking? Authorizing Provider  Ascorbic Acid (VITAMIN C PO) Take 1 tablet by mouth daily.   Yes Historical Provider, MD  CAMRESE 0.15-0.03 &0.01 MG tablet Take 1 tablet by mouth daily. 01/31/14  Yes Historical Provider, MD  clonazePAM (KLONOPIN) 0.5 MG tablet Take 0.5 tablets (0.25 mg total) by mouth at bedtime. 04/28/14  Yes Carmen Dohmeier, MD  Cyanocobalamin (VITAMIN B-12 PO) Take 1 tablet by mouth daily.   Yes Historical Provider, MD  folic acid (FOLVITE) 1 MG tablet Take 1 mg by mouth daily.   Yes Historical Provider, MD  influenza vac recombinant HA trivalent (FLUBLOK) injection Inject 0.5  mLs into the muscle once.   Yes Historical Provider, MD  lacosamide (VIMPAT) 50 MG TABS tablet 50 mg in AM 100 mg at PM Patient taking differently: Take 50-100 mg by mouth 2 (two) times daily. Takes 50 mg in morning and 100 mg at bedtime 04/28/14  Yes Melvyn Novas, MD  PRESCRIPTION MEDICATION once. TB injection   Yes Historical Provider, MD  simethicone (INFANTS SIMETHICONE) 40 MG/0.6ML drops Take 0.6 mLs (40 mg total) by mouth 4 (four) times daily as needed. Patient taking differently: Take 80 mg by mouth daily as needed for flatulence.  02/27/13  Yes Carmen Dohmeier, MD  topiramate (TOPAMAX) 100 MG tablet 50 mg in AM and 100 mg in PM  po. Patient taking differently: Take 50-100 mg by mouth 2 (two) times daily. Takes  in the morning and  at bedtime 04/28/14  Yes Melvyn Novas, MD  zonisamide (ZONEGRAN) 25 MG capsule Take 2 capsules (50 mg total) by mouth 2 (two) times daily. Patient taking differently: Take 50 mg by mouth at bedtime.  04/28/14  Yes Carmen Dohmeier, MD  Topiramate ER 150 MG CS24 Take 150 mg by mouth 1 day or 1 dose. Patient not taking: Reported on 06/23/2014 04/28/14   Porfirio Mylar Dohmeier, MD   BP 120/77 mmHg  Pulse 66  Temp(Src) 98.3 F (36.8 C) (Oral)  Resp 17  SpO2 100%  LMP 04/22/2014 Physical Exam  Constitutional: She is oriented to person, place, and time. She appears well-developed and well-nourished.  HENT:  Head: Normocephalic and atraumatic.  Right Ear: External ear normal.  Left Ear: External ear normal.  Nose: Nose normal.  Mouth/Throat: Oropharynx is clear and moist.  Eyes: Conjunctivae and EOM are normal. Pupils are equal, round, and reactive to light.  Neck: Normal range of motion. Neck supple.  Cardiovascular: Normal rate, regular rhythm, normal heart sounds and intact distal pulses.   Pulmonary/Chest: Effort normal and breath sounds normal. She exhibits tenderness.    Abdominal: Soft. Bowel sounds are normal.  Musculoskeletal: Normal range of motion.  Neurological: She is alert and oriented to person, place, and time. She has normal reflexes.  Skin: Skin is warm and dry.  Psychiatric: She has a normal mood and affect. Her behavior is normal. Judgment and thought content normal.  Nursing note and vitals reviewed.   ED Course  Procedures (including critical care time) Labs Review Labs Reviewed  PREGNANCY, URINE  CBC  BASIC METABOLIC PANEL  POC URINE PREG, ED  Rosezena Sensor, ED    Imaging Review Dg Chest 2 View  06/23/2014   CLINICAL DATA:  Chest pain.  EXAM: CHEST  2 VIEW  COMPARISON:  April 22, 2013.  FINDINGS: The heart size and mediastinal contours are  within normal limits. Both lungs are clear. No pneumothorax or pleural effusion is noted. The visualized skeletal structures are unremarkable.  IMPRESSION: No acute cardiopulmonary abnormality seen.   Electronically Signed   By: Roque Lias M.D.   On: 06/23/2014 10:51     EKG Interpretation   Date/Time:  Monday June 23 2014 08:52:43 EST Ventricular Rate:  78 PR Interval:  138 QRS Duration: 105 QT Interval:  355 QTC Calculation: 404 R Axis:   66 Text Interpretation:  Normal sinus rhythm Confirmed by Winnie Barsky MD, Zamirah Denny  ([id number]) on 06/23/2014 10:01:24 AM      MDM   Final diagnoses:  Chest wall pain    23 y.o. Female with history c.w. Chest wall pain.  NO acute changes seen on x-Elmira Olkowski or  ekg.  Plan nsaid- discussed with patient and mother and they voice understanding of plan and need for follow up.    Hilario Quarry, MD 06/24/14 2233

## 2014-06-23 NOTE — Discharge Instructions (Signed)
Chest Wall Pain °Chest wall pain is pain felt in or around the chest bones and muscles. It may take up to 6 weeks to get better. It may take longer if you are active. Chest wall pain can happen on its own. Other times, things like germs, injury, coughing, or exercise can cause the pain. °HOME CARE  °· Avoid activities that make you tired or cause pain. Try not to use your chest, belly (abdominal), or side muscles. Do not use heavy weights. °· Put ice on the sore area. °¨ Put ice in a plastic bag. °¨ Place a towel between your skin and the bag. °¨ Leave the ice on for 15-20 minutes for the first 2 days. °· Only take medicine as told by your doctor. °GET HELP RIGHT AWAY IF:  °· You have more pain or are very uncomfortable. °· You have a fever. °· Your chest pain gets worse. °· You have new problems. °· You feel sick to your stomach (nauseous) or throw up (vomit). °· You start to sweat or feel lightheaded. °· You have a cough with mucus (phlegm). °· You cough up blood. °MAKE SURE YOU:  °· Understand these instructions. °· Will watch your condition. °· Will get help right away if you are not doing well or get worse. °Document Released: 10/19/2007 Document Revised: 07/25/2011 Document Reviewed: 12/27/2010 °ExitCare® Patient Information ©2015 [employer]. This information is not intended to replace advice given to you by your health care provider. Make sure you discuss any questions you have with your health care provider. ° °

## 2014-07-18 ENCOUNTER — Telehealth: Payer: Self-pay | Admitting: Neurology

## 2014-07-18 NOTE — Telephone Encounter (Signed)
Pt's mother is calling to check the status for the patient assistance form for Vimpat.  Please call and advise.

## 2014-07-18 NOTE — Telephone Encounter (Signed)
I called [employer] at [phone].  Spoke with Gunnar FusiPaula.  She said they called the patient and left a message on 02/04.  Because the patient has prescription coverage, they state she is not eligible for assistance.  They state if she has a letter from ins stating they will not cover any portion of the cost, she may provide this to them and they will re-review the request.  I called patient back.  Spoke with Ms Jennifer Luna.  Relayed the info provided by [employer].  She verbalized understanding.  Says the ins does cover the med, however the co-pay is high.  States the patient will be losing ins coverage, but she is not sure when this will occur.  They will be certain to provide a letter showing coverage has been terminated once this occurs.

## 2014-07-21 ENCOUNTER — Ambulatory Visit (INDEPENDENT_AMBULATORY_CARE_PROVIDER_SITE_OTHER): Payer: BLUE CROSS/BLUE SHIELD | Admitting: Emergency Medicine

## 2014-07-21 VITALS — BP 104/74 | HR 72 | Temp 98.6°F | Resp 16 | Ht 64.0 in | Wt 129.0 lb

## 2014-07-21 DIAGNOSIS — J209 Acute bronchitis, unspecified: Secondary | ICD-10-CM | POA: Diagnosis not present

## 2014-07-21 DIAGNOSIS — J0140 Acute pansinusitis, unspecified: Secondary | ICD-10-CM | POA: Diagnosis not present

## 2014-07-21 MED ORDER — PSEUDOEPHEDRINE-GUAIFENESIN ER 60-600 MG PO TB12: 1.0000 | ORAL_TABLET | Freq: Two times a day (BID) | ORAL | Status: DC

## 2014-07-21 MED ORDER — LEVOFLOXACIN 500 MG PO TABS: 500.0000 mg | ORAL_TABLET | Freq: Every day | ORAL | Status: AC

## 2014-07-21 MED ORDER — HYDROCOD POLST-CHLORPHEN POLST 10-8 MG/5ML PO LQCR: 5.0000 mL | Freq: Two times a day (BID) | ORAL | Status: DC | PRN

## 2014-07-21 NOTE — Patient Instructions (Signed)

## 2014-07-21 NOTE — Progress Notes (Signed)
[facility] [address], [place] [address] [phone]  Date:  07/21/2014   Name:  Jennifer Luna   DOB:  [birth year]   MRN:  [id number]  PCP:  Tonye PearsonOLITTLE, ROBERT P, MD    Chief Complaint: Sore Throat; Nasal drainage; and Eye Drainage   History of Present Illness:  Jennifer Luna is a 24 y.o. very pleasant female patient who presents with the following:  Ill with purulent nasal drainage and post nasal drainage Thursday Has a cough productive purulent sputum worse when lays down No wheezing or shortness of breath Some sore throat. No rash No fever or chills No nausea or vomiting.  No stool change No improvement with over the counter medications or other home remedies.  Denies other complaint or health concern today.   Patient Active Problem List   Diagnosis Date Noted  . Absence epileptic syndrome, not intractable, w/o status epilepticus 04/28/2014  . Arthralgia 04/23/2013  . Shortness of breath 04/23/2013  . Intractable absence epilepsy 09/21/2012  . Pain in joint, shoulder region 09/21/2012    Past Medical History  Diagnosis Date  . Seizures     nx of absent and grand mal  . Thrombocytopenia 01/11/12    office notes from last visit, with zarontin and depakote  . Sleepiness 01/11/2012    notes from office visit  . Amenorrhea 01/11/2012    notes from office visit, PERIMENTRUAL PAINS  . Epilepsy     with absence seizures, abnormal EEG  . Sleepiness     DAYTIME  . Arthralgia 04/23/2013  . Shortness of breath 04/23/2013    Past Surgical History  Procedure Laterality Date  . Shoulder injury      History  Substance Use Topics  . Smoking status: Never Smoker   . Smokeless tobacco: Never Used  . Alcohol Use: No    Family History  Problem Relation Age of Onset  . Hypothyroidism Mother   . Arthritis Mother   . Hypertension Father   . Diabetes Father   . Hyperlipidemia Father   . Breast cancer Maternal Grandmother   . Heart  disease Maternal Grandmother   . Hypertension Maternal Grandmother   . Heart disease Maternal Grandfather   . Heart disease Paternal Grandmother   . Rheum arthritis Paternal Grandmother   . Lupus Maternal Aunt   . Seizures Paternal Uncle     as a child  . Asthma Sister   . Rheum arthritis Maternal Aunt   . Asthma      Maternal [employer] Uncle   . Asthma Sister     had as a child    Allergies  Allergen Reactions  . Penicillins Rash  . Sulfa Antibiotics Rash  . Erythromycin Rash    Medication list has been reviewed and updated.  Current Outpatient Prescriptions on File Prior to Visit  Medication Sig Dispense Refill  . Ascorbic Acid (VITAMIN C PO) Take 1 tablet by mouth daily.    Marland Kitchen. CAMRESE 0.15-0.03 &0.01 MG tablet Take 1 tablet by mouth daily.  3  . clonazePAM (KLONOPIN) 0.5 MG tablet Take 0.5 tablets (0.25 mg total) by mouth at bedtime. 45 tablet 1  . Cyanocobalamin (VITAMIN B-12 PO) Take 1 tablet by mouth daily.    . folic acid (FOLVITE) 1 MG tablet Take 1 mg by mouth daily.    Marland Kitchen. ibuprofen (ADVIL,MOTRIN) 600 MG tablet Take 1 tablet (600 mg total) by mouth every 6 (six) hours as needed. 30 tablet 0  .  influenza vac recombinant HA trivalent (FLUBLOK) injection Inject 0.5 mLs into the muscle once.    . lacosamide (VIMPAT) 50 MG TABS tablet 50 mg in AM 100 mg at PM (Patient taking differently: Take 50-100 mg by mouth 2 (two) times daily. Takes 50 mg in morning and 100 mg at bedtime) 270 tablet 1  . PRESCRIPTION MEDICATION once. TB injection    . simethicone (INFANTS SIMETHICONE) 40 MG/0.6ML drops Take 0.6 mLs (40 mg total) by mouth 4 (four) times daily as needed. (Patient taking differently: Take 80 mg by mouth daily as needed for flatulence. ) 30 mL 0  . topiramate (TOPAMAX) 100 MG tablet 50 mg in AM and 100 mg in PM po. (Patient taking differently: Take 50-100 mg by mouth 2 (two) times daily. Takes  in the morning and  at bedtime) 45 tablet 5  . zonisamide (ZONEGRAN) 25 MG  capsule Take 2 capsules (50 mg total) by mouth 2 (two) times daily. (Patient taking differently: Take 50 mg by mouth at bedtime. ) 360 capsule 3  . Topiramate ER 150 MG CS24 Take 150 mg by mouth 1 day or 1 dose. (Patient not taking: Reported on 06/23/2014) 90 each 3   No current facility-administered medications on file prior to visit.    Review of Systems:  As per HPI, otherwise negative.    Physical Examination: Filed Vitals:   07/21/14 1413  BP: 104/74  Pulse: 72  Temp: 98.6 F (37 C)  Resp: 16   Filed Vitals:   07/21/14 1413  Height:  (1.626 m)  Weight: 129 lb (58.514 kg)   Body mass index is 22.13 kg/(m^2). Ideal Body Weight: Weight in (lb) to have BMI = 25: 145.3  GEN: WDWN, NAD, Non-toxic, A & O x 3 HEENT: Atraumatic, Normocephalic. Neck supple. No masses, No LAD. Ears and Nose: No external deformity. CV: RRR, No M/G/R. No JVD. No thrill. No extra heart sounds. PULM: CTA B, no wheezes, crackles, rhonchi. No retractions. No resp. distress. No accessory muscle use. ABD: S, NT, ND, +BS. No rebound. No HSM. EXTR: No c/c/e NEURO Normal gait.  PSYCH: Normally interactive. Conversant. Not depressed or anxious appearing.  Calm demeanor.    Assessment and Plan: Acute sinusitis Acute bronchitis levaquin mucinex tussionex   Signed,  Phillips Odor, MD

## 2014-09-18 ENCOUNTER — Telehealth: Payer: Self-pay | Admitting: Neurology

## 2014-09-18 NOTE — Telephone Encounter (Signed)
Patients mother called and would like to speak with you regarding medication for the patient. Please call and advise.

## 2014-09-18 NOTE — Telephone Encounter (Signed)
I called back.  She wanted to discuss Vimpat.  The patient was previously denied for assistance through [employer] because she has ins coverage.  Patient may be losing her ins coverage, but that has not occurred yet.  They may be able to use discount vouchers from [email or web address].

## 2014-09-23 ENCOUNTER — Telehealth: Payer: Self-pay

## 2014-09-23 NOTE — Telephone Encounter (Signed)
Spoke to father. Explained medication available for pick up at front desk. Father verbalized understanding.

## 2014-09-26 ENCOUNTER — Telehealth: Payer: Self-pay | Admitting: Neurology

## 2014-09-26 MED ORDER — TOPIRAMATE 100 MG PO TABS: ORAL_TABLET | ORAL | Status: DC

## 2014-09-26 NOTE — Telephone Encounter (Signed)
I called back.  Spoke with Ms Lupita LeashDonna.  She would like to know if Rx can be [facility], and also asked that we add [facility] [address] for future reference, as they may be changing pharmacies soon.  Rx has been sent for 90 day supply, and [facility].

## 2014-09-26 NOTE — Telephone Encounter (Signed)
Lupita LeashDonna, pt's mother called requesting a refill for topiramate (TOPAMAX) 100 MG tablet. She states the patient is out of medication and she would like to pick up the script Since she is changing pharmacy. Please call and advise. Lupita LeashDonna can be reached @ [phone]

## 2014-10-15 NOTE — Telephone Encounter (Signed)
Done

## 2014-10-17 ENCOUNTER — Telehealth: Payer: Self-pay | Admitting: Neurology

## 2014-10-17 NOTE — Telephone Encounter (Signed)
Ree KidaJack with [facility] on [address] phone # [phone] called stating patients mother said she had just spoke with our office and was very angry that the script had not been filled There is no documentation of this and I relayed that to him.  The fax # [phone].

## 2014-10-17 NOTE — Telephone Encounter (Signed)
The pharmacy just sent us this refill request at 8:22 am this morning.  We had not yet had a chance to [place].  I called the pharmacy.  Spoke with Francee Piccoloerek.  He is aware.  I called mom back.  She is aware.

## 2014-10-29 ENCOUNTER — Encounter: Payer: Self-pay | Admitting: Neurology

## 2014-10-29 ENCOUNTER — Ambulatory Visit (INDEPENDENT_AMBULATORY_CARE_PROVIDER_SITE_OTHER): Payer: BLUE CROSS/BLUE SHIELD | Admitting: Neurology

## 2014-10-29 VITALS — BP 110/72 | HR 82 | Resp 20 | Ht 64.96 in | Wt 134.5 lb

## 2014-10-29 DIAGNOSIS — G40A09 Absence epileptic syndrome, not intractable, without status epilepticus: Secondary | ICD-10-CM | POA: Diagnosis not present

## 2014-10-29 MED ORDER — TOPIRAMATE ER 150 MG PO SPRINKLE CAP24: 150.0000 mg | EXTENDED_RELEASE_CAPSULE | ORAL | Status: DC

## 2014-10-29 MED ORDER — ZONISAMIDE 25 MG PO CAPS: 50.0000 mg | ORAL_CAPSULE | Freq: Every day | ORAL | Status: DC

## 2014-10-29 MED ORDER — CLONAZEPAM 0.5 MG PO TABS: 0.2500 mg | ORAL_TABLET | Freq: Every day | ORAL | Status: DC

## 2014-10-29 MED ORDER — LACOSAMIDE 50 MG PO TABS: 50.0000 mg | ORAL_TABLET | Freq: Two times a day (BID) | ORAL | Status: DC

## 2014-10-29 NOTE — Patient Instructions (Signed)
Lacosamide tablets What is this medicine? LACOSAMIDE (la KOE sa mide) is used to control seizures caused by certain types of epilepsy. This medicine may be used for other purposes; ask your health care provider or pharmacist if you have questions. COMMON BRAND NAME(S): Vimpat What should I tell my health care provider before I take this medicine? They need to know if you have any of these conditions: -dehydration -heart disease, including heart failure -history of a drug or alcohol abuse problem -kidney disease -liver disease -suicidal thoughts, plans, or attempt; a previous suicide attempt by you or a family member -an unusual or allergic reaction to lacosamide, other medicines, foods, dyes, or preservatives -pregnant or trying to get pregnant -breast-feeding How should I use this medicine? Take this medicine by mouth with a glass of water. You can take it with or without food. Follow the directions on the prescription label. Take your doses at regular intervals. Do not take your medicine more often than directed. Do not stop taking except on the advice of your doctor or health care professional. A special MedGuide will be given to you by the pharmacist with each prescription and refill. Be sure to read this information carefully each time. Talk to your pediatrician regarding the use of this medicine in children. Special care may be needed. Overdosage: If you think you have taken too much of this medicine contact a poison control center or emergency room at once. NOTE: This medicine is only for you. Do not share this medicine with others. What if I miss a dose? If you miss a dose, take it as soon as you can. If it is almost time for your next dose, take only that dose. Do not take double or extra doses. What may interact with this medicine? -atazanavir -beta-blockers like metoprolol and propranolol -calcium channel blockers like diltiazem and  verapamil -digoxin -dronedarone -lopinavir/ritonavir This list may not describe all possible interactions. Give your health care provider a list of all the medicines, herbs, non-prescription drugs, or dietary supplements you use. Also tell them if you smoke, drink alcohol, or use illegal drugs. Some items may interact with your medicine. What should I watch for while using this medicine? Visit your doctor or health care professional for regular checks on your progress. This medicine needs careful monitoring. Wear a medical ID bracelet or chain, and carry a card that describes your disease and details of your medicine and dosage times. You may get drowsy or dizzy. Do not drive, use machinery, or do anything that needs mental alertness until you know how this medicine affects you. Do not stand or sit up quickly, especially if you are an older patient. This reduces the risk of dizzy or fainting spells. Alcohol may interfere with the effect of this medicine. Avoid alcoholic drinks. The use of this medicine may increase the chance of suicidal thoughts or actions. Pay special attention to how you are responding while on this medicine. Any worsening of mood, or thoughts of suicide or dying should be reported to your health care professional right away. Women who become pregnant while using this medicine may enroll in the [place] Antiepileptic Drug Pregnancy Registry by calling [phone]. This registry collects information about the safety of antiepileptic drug use during pregnancy. What side effects may I notice from receiving this medicine? Side effects that you should report to your doctor or health care professional as soon as possible: -allergic reactions like skin rash, itching or hives, swelling of the face, lips, or  tongue -confusion -feeling faint or lightheaded, falls -fever -irregular heart beat -loss of memory -suicidal thoughts or other mood changes -unusually weak or  tired -yellowing of the eyes, skin Side effects that usually do not require medical attention (report to your doctor or health care professional if they continue or are bothersome): -constipation -diarrhea -drowsiness -dry mouth -headache -nausea This list may not describe all possible side effects. Call your doctor for medical advice about side effects. You may report side effects to FDA at 1-800-FDA-1088. Where should I keep my medicine? Keep out of the reach of children. This medicine can be abused. Keep your medicine in a safe place to protect it from theft. Do not share this medicine with anyone. Selling or giving away this medicine is dangerous and against the law. Store at room temperature between 15 and 30 degrees C (59 and 86 degrees F). Throw away any unused medicine after the expiration date. NOTE: This sheet is a summary. It may not cover all possible information. If you have questions about this medicine, talk to your doctor, pharmacist, or health care provider.  2015, Elsevier/Gold Standard. (2009-12-29 21:02:36)

## 2014-10-29 NOTE — Progress Notes (Signed)
[facility]  Provider:  Melvyn Novas, M D  Referring Provider: Tonye Pearson, MD Primary Care Physician:  Dr. Cleta Alberts   Chief Complaint  Patient presents with  . Follow-up    seizures, rm 10, alone  . Seizures      Jennifer Luna is a 24 y.o. female  Is seen here as a  revisit  from Dr. Cleta Alberts. She has been one year seizure free and driving. She still has occasional little absence "blurps" but she has not had the loss of conversational flow and the secondary generalization. She had onset of seizures at age 40 . Her mother described the toddler coming up to her and sudden eye flutter and staring off, unresponsive.   Her last  seizure in 2013 , April.   Driving to work, has 2 part time jobs. She has developed well, has become much more independent and can drive.   She's taking 2 Vimpat 100 mg by mouth twice a day, she's taking Topiramate 100 mg bid po.  She's taking Klonopin or 0.5 mg a half tablet by mouth at bedtime.  She has been informed about a VNS treatment option, she doesn't need now as she doing so well.      Review of Systems: Out of a complete 14 system review, the patient complains of only the following symptoms, and all other reviewed systems are negative.  "staring seizures", right shoulder pain since a seizure related fall. ROM limitation.  She feels her sleep is less restorative.  History   Social History  . Marital Status: Single    Spouse Name: N/A  . Number of Children: 0  . Years of Education: 12   Occupational History  . STUDENT/[employer]   . STUDENT    Social History Main Topics  . Smoking status: Never Smoker   . Smokeless tobacco: Never Used  . Alcohol Use: No  . Drug Use: No  . Sexual Activity: Not on file   Other Topics Concern  . Not on file   Social History Narrative   Patient is single and lives at home with her parents.   Patient is working part-time.   Patient has a high school education.   Patient is  right-handed.   Patient drinks two cups of coffee.    Family History  Problem Relation Age of Onset  . Hypothyroidism Mother   . Arthritis Mother   . Hypertension Father   . Diabetes Father   . Hyperlipidemia Father   . Breast cancer Maternal Grandmother   . Heart disease Maternal Grandmother   . Hypertension Maternal Grandmother   . Heart disease Maternal Grandfather   . Heart disease Paternal Grandmother   . Rheum arthritis Paternal Grandmother   . Lupus Maternal Aunt   . Seizures Paternal Uncle     as a child  . Asthma Sister   . Rheum arthritis Maternal Aunt   . Asthma      Maternal [employer]   . Asthma Sister     had as a child    Past Medical History  Diagnosis Date  . Seizures     nx of absent and grand mal  . Thrombocytopenia 01/11/12    office notes from last visit, with zarontin and depakote  . Sleepiness 01/11/2012    notes from office visit  . Amenorrhea 01/11/2012    notes from office visit, PERIMENTRUAL PAINS  . Epilepsy     with absence seizures, abnormal EEG  .  Sleepiness     DAYTIME  . Arthralgia 04/23/2013  . Shortness of breath 04/23/2013    Past Surgical History  Procedure Laterality Date  . Shoulder injury      Current Outpatient Prescriptions  Medication Sig Dispense Refill  . Ascorbic Acid (VITAMIN C PO) Take 1 tablet by mouth daily.    Marland Kitchen CAMRESE 0.15-0.03 &0.01 MG tablet Take 1 tablet by mouth daily.  3  . clonazePAM (KLONOPIN) 0.5 MG tablet TAKE 1/2 TABLET BY MOUTH AT BEDTIME 45 tablet 1  . Cyanocobalamin (VITAMIN B-12 PO) Take 1 tablet by mouth daily.    . folic acid (FOLVITE) 1 MG tablet Take 1 mg by mouth daily.    Marland Kitchen ibuprofen (ADVIL,MOTRIN) 600 MG tablet Take 1 tablet (600 mg total) by mouth every 6 (six) hours as needed. 30 tablet 0  . lacosamide (VIMPAT) 50 MG TABS tablet 50 mg in AM 100 mg at PM (Patient taking differently: Take 50-100 mg by mouth 2 (two) times daily. Takes 50 mg in morning and 100 mg at bedtime) 270 tablet 1   . PRESCRIPTION MEDICATION once. TB injection    . pseudoephedrine-guaifenesin (MUCINEX D) 60-600 MG per tablet Take 1 tablet by mouth every 12 (twelve) hours. 18 tablet 0  . simethicone (INFANTS SIMETHICONE) 40 MG/0.6ML drops Take 0.6 mLs (40 mg total) by mouth 4 (four) times daily as needed. (Patient taking differently: Take 80 mg by mouth daily as needed for flatulence. ) 30 mL 0  . topiramate (TOPAMAX) 100 MG tablet 50 mg in AM and 100 mg in PM po. 135 tablet 0  . Topiramate ER 150 MG CS24 Take 150 mg by mouth 1 day or 1 dose. 90 each 3  . zonisamide (ZONEGRAN) 25 MG capsule Take 2 capsules (50 mg total) by mouth 2 (two) times daily. (Patient taking differently: Take 50 mg by mouth at bedtime. ) 360 capsule 3  . chlorpheniramine-HYDROcodone (TUSSIONEX PENNKINETIC ER) 10-8 MG/5ML LQCR Take 5 mLs by mouth every 12 (twelve) hours as needed. (Patient not taking: Reported on 10/29/2014) 60 mL 0  . influenza vac recombinant HA trivalent (FLUBLOK) injection Inject 0.5 mLs into the muscle once.     No current facility-administered medications for this visit.    Allergies as of 10/29/2014 - Review Complete 10/29/2014  Allergen Reaction Noted  . Penicillins Rash 06/11/2011  . Sulfa antibiotics Rash 06/11/2011  . Erythromycin Rash 06/11/2011    Vitals: BP 110/72 mmHg  Pulse 82  Resp 20  Ht 5' 4.96" (1.65 m)  Wt 134 lb 8 oz (61.009 kg)  BMI 22.41 kg/m2 Last Weight:  Wt Readings from Last 1 Encounters:  10/29/14 134 lb 8 oz (61.009 kg)   Last Height:   Ht Readings from Last 1 Encounters:  10/29/14 5' 4.96" (1.65 m)    Physical exam:Head: Normocephalic, atraumatic. Neck is supple. Mallampati1,    Cardiovascular: Regular rate and rhythm without murmurs or carotid bruit, and without distended neck veins.  Respiratory: Lungs are clear to auscultation.  Skin: Without evidence of edema, or rash  Trunk: BMI is normal .  Neurologic exam :  The patient is awake and alert, oriented to place  and time.  Memory subjective described as intact.  There is a normal attention span & concentration ability.  Speech is fluent without dysarthria, dysphonia , aphasia. Mood and affect are appropriate.  Cranial nerves:  Pupils are equal and briskly reactive to light. Funduscopic exam without evidence of pallor or edema.  Extraocular  movements in vertical and horizontal planes intact and with an endpoint nystagmus.  Visual fields by finger perimetry are intact.  Hearing to finger rub intact. Facial sensation intact to fine touch.  Facial motor strength is symmetric and tongue and uvula move midline.  Motor exam: Normal tone and normal muscle bulk and symmetric normal strength in all extremities.  Grip strength is normal.  Sensory: Fine touch, pinprick and vibration were tested in all extremities.  Proprioception is tested in the upper extremities only. This was normal.  Coordination: Rapid alternating movements in the fingers/hands is tested and normal.  Finger-to-nose maneuver tested and normal without evidence of ataxia, dysmetria or tremor.  Gait and station: Patient walks without assistive device and is able unassisted to climb up to the exam table.  Strength within normal limits. Stance is stable and normal. Steps are unfragmented.  Deep tendon reflexes: in the upper and lower extremities are brisk -symmetric and intact.  Downgoing Babinski.      1)  Primary absence seizures , controlled on VIMPAT , Zonogran and Topiramate.  Seizure free since April 2013.   [facility] [address].    Klonopin is more a sleeper than a seizure medication.          This Patient is allowed to drive.   r  VIMPAT by 50 mg in AM 100 mg at PM .  Jennifer Luna  received a letter from [facility] dated 03/31/2014 [address] [address], [place], [place] [address].  The letter stated that from 05/16/2014 on Vimpat were no longer be covered under the plantar or change to a nonpreferred tier with a high  out-of-pocket cost.  There is no generic available for Vimpat to my knowledge and the patient has become from intractable epilepsy controlled epileptic under the treatment by Vimpat. itis not medically responsible to change the patient to another medication -   She could easily have seizures again. Even a change in  dosing or to a generic formulation, ( if this would be available), is not medically indicated.  Lary Eckardt, MD

## 2014-10-31 ENCOUNTER — Telehealth: Payer: Self-pay

## 2014-10-31 NOTE — Telephone Encounter (Signed)
Patient is interested in trying to apply for Vimpat assistance again.  The program was just purchased by a new entity on Monday, so a new enrollment form is required.  I called to advise.  Got no answer.  Left message with this info, and advised we can gladly mail this form to the patient for completion.

## 2014-11-05 ENCOUNTER — Telehealth: Payer: Self-pay | Admitting: Neurology

## 2014-11-05 DIAGNOSIS — R5601 Complex febrile convulsions: Secondary | ICD-10-CM

## 2014-11-05 MED ORDER — TOPIRAMATE 100 MG PO TABS: ORAL_TABLET | ORAL | Status: DC

## 2014-11-05 NOTE — Telephone Encounter (Signed)
Patient called requesting refills for clonazePAM (KLONOPIN) 0.5 MG tablet ,topiramate (TOPAMAX) 100 MG tablet , zonisamide (ZONEGRAN) 25 MG capsule . Please send to new pharmacy [facility] [address] Patient can be reached at [phone].

## 2014-11-05 NOTE — Telephone Encounter (Signed)
Patient is requesting Topamax instead of Topamax ER.  Request forwarded to provider for review.   All other meds have been sent to new pharmacy.

## 2014-11-26 ENCOUNTER — Telehealth: Payer: Self-pay | Admitting: Neurology

## 2014-11-26 NOTE — Telephone Encounter (Signed)
Patient is calling to get samples of lacosamide (VIMPAT) 50 MG TABS tablet. Please call patient and advise. Thank you.

## 2014-11-27 NOTE — Telephone Encounter (Signed)
Tad MooreCasandra has placed samples at the front desk.  I called back to advise.  Got no answer.  Left message.

## 2014-12-01 NOTE — Telephone Encounter (Signed)
Pt coming to pick up samples today.

## 2015-01-29 IMAGING — CR DG CHEST 2V
2 series · 2 of 2 positions shown · non-contrast
Comparison: None.

CLINICAL DATA: Short of breath, central chest pain

EXAM:
CHEST  2 VIEW

[w chest lat]
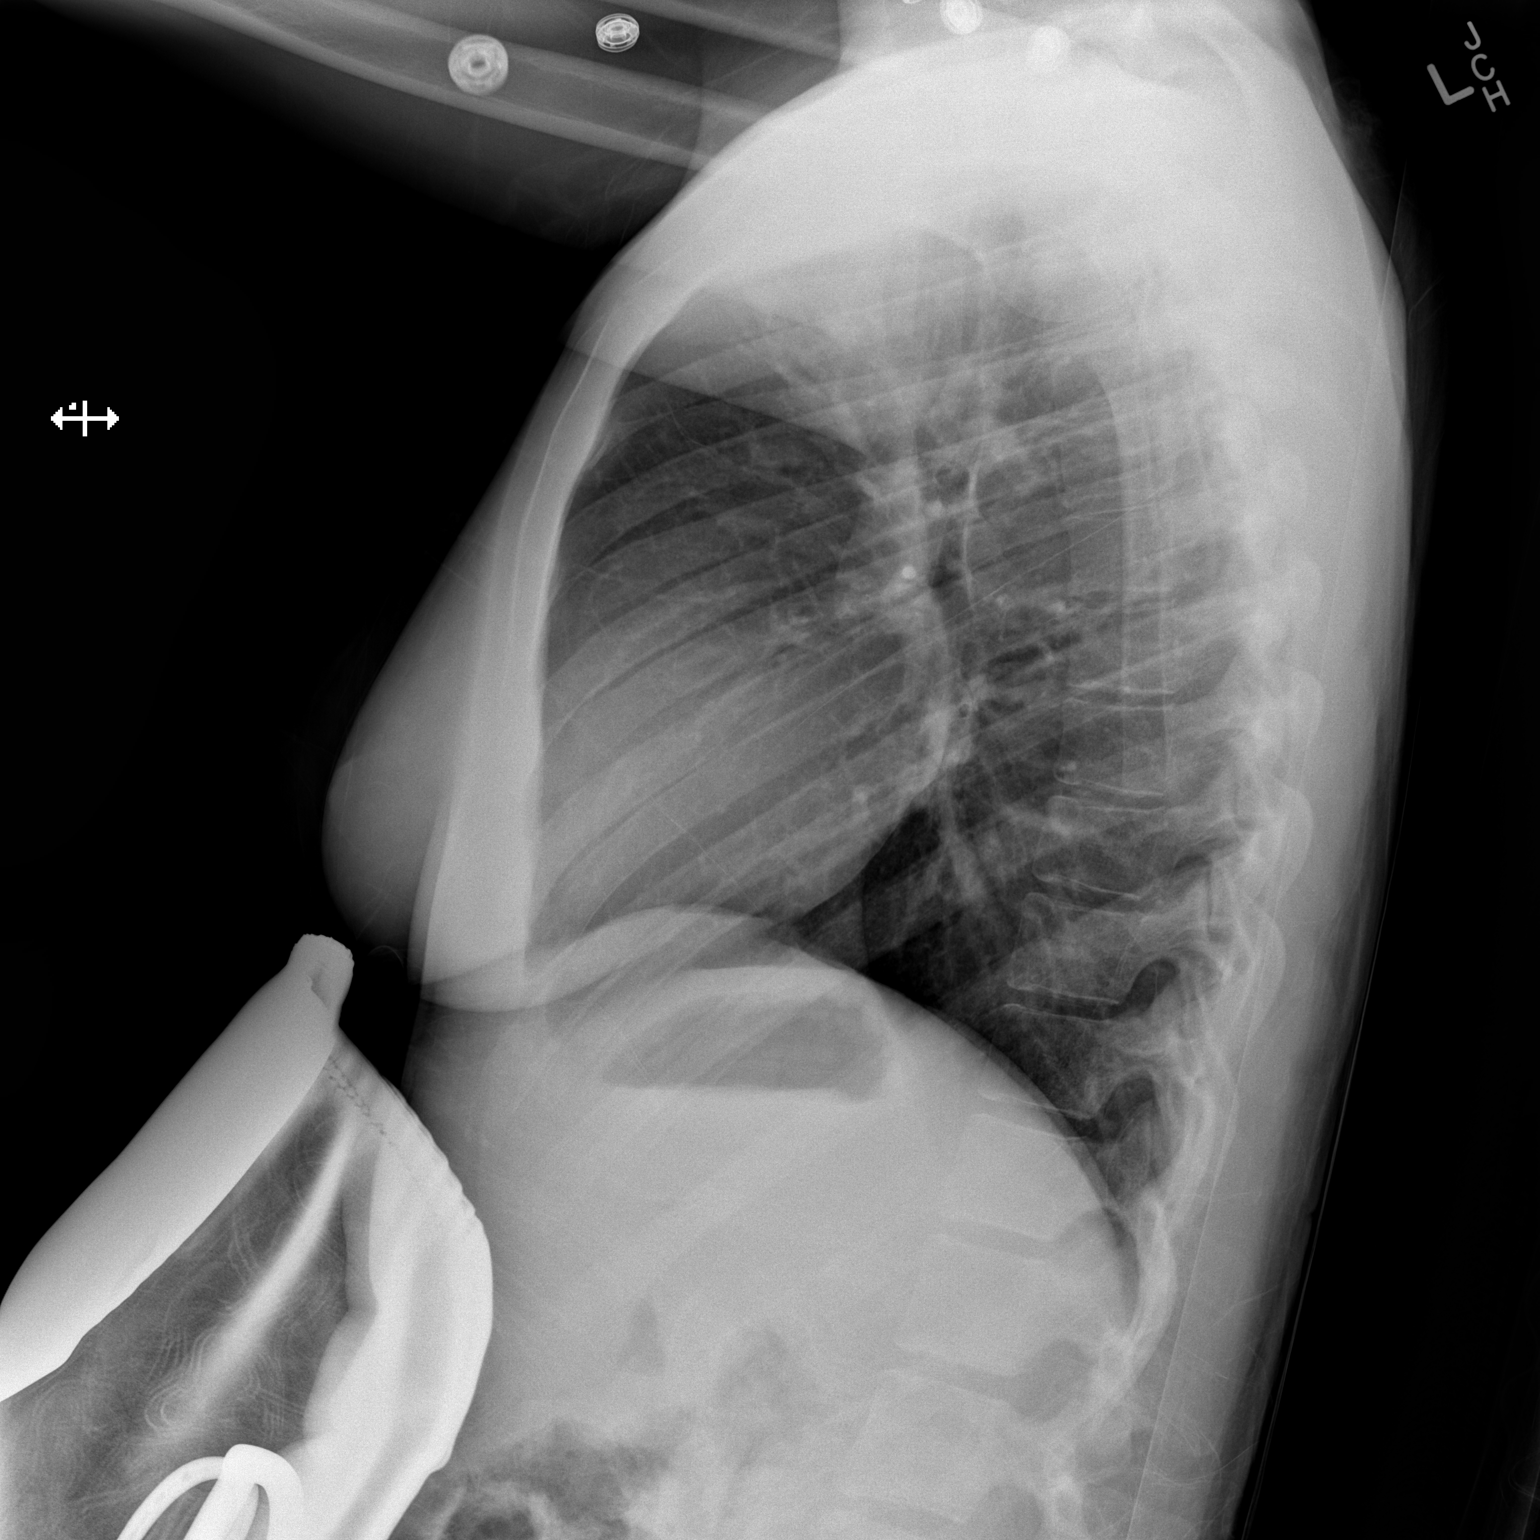

[x chest ap]
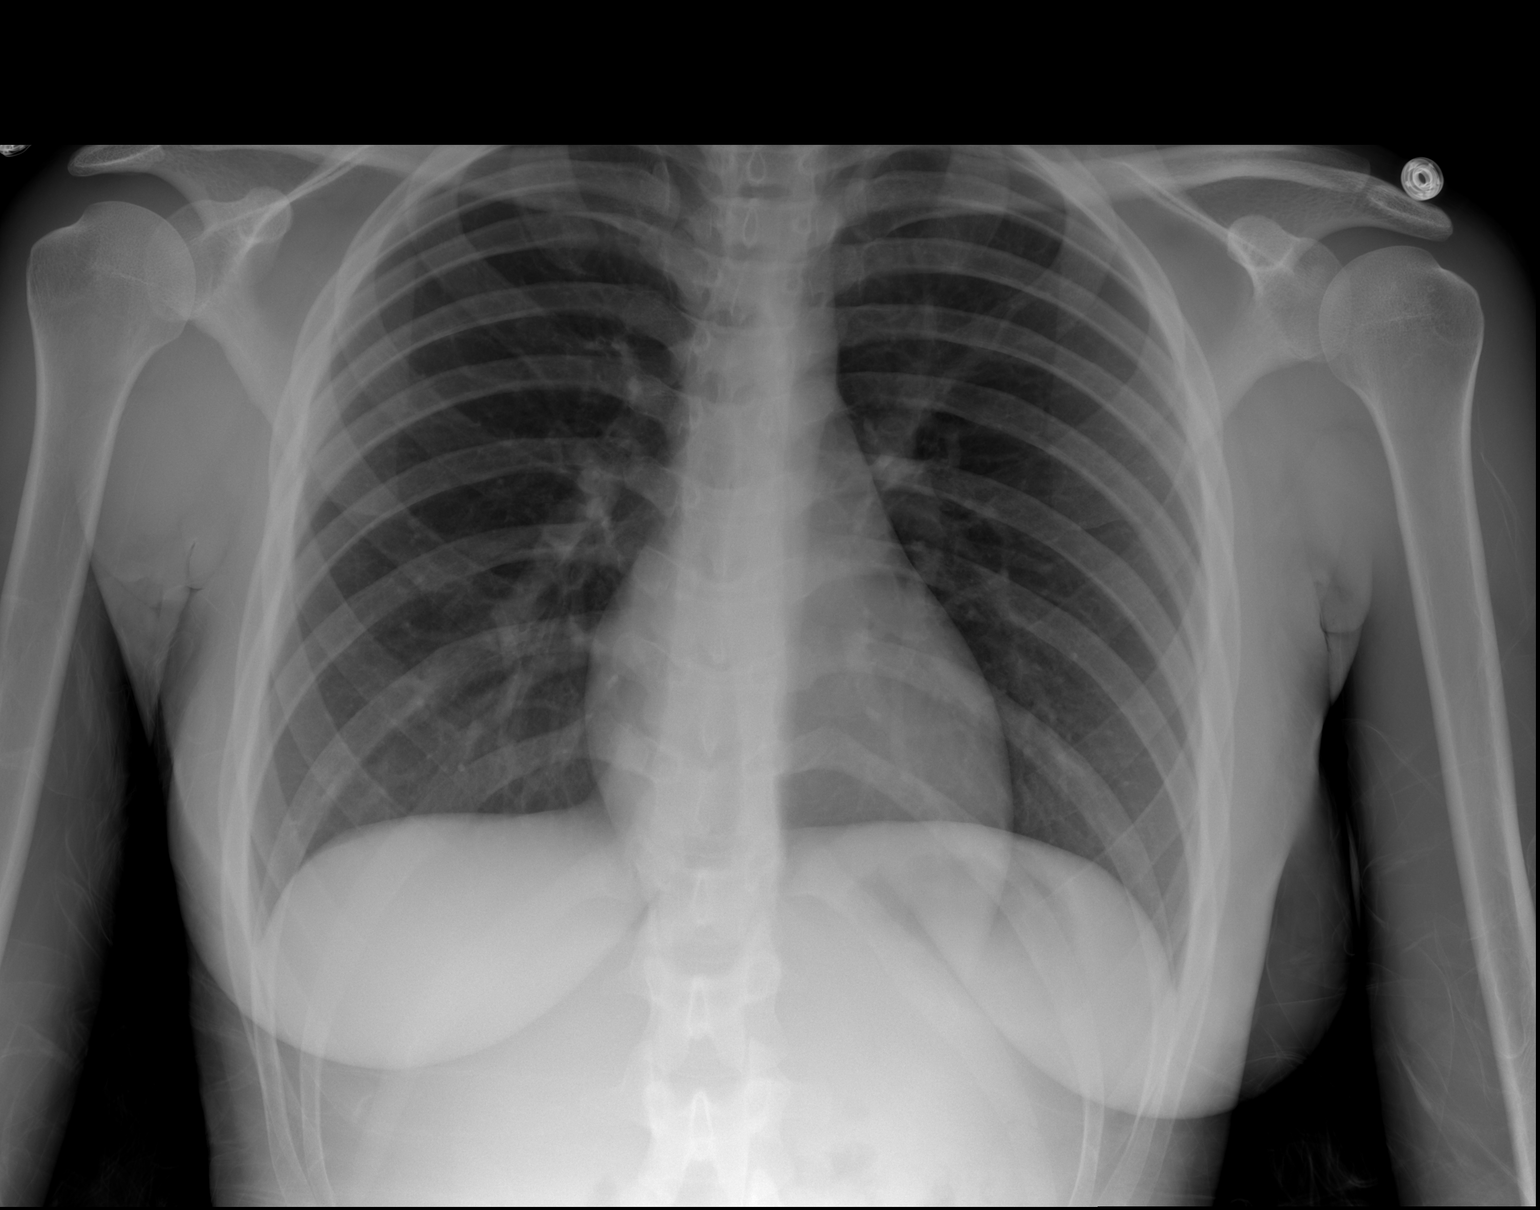

[2 of 2 positions shown; findings below may reference images not displayed]

FINDINGS: The lungs are clear and negative for focal airspace consolidation,
pulmonary edema or suspicious pulmonary nodule. No pleural effusion
or pneumothorax. Cardiac and mediastinal contours are within normal
limits. No acute fracture or lytic or blastic osseous lesions. The
visualized upper abdominal bowel gas pattern is unremarkable.
IMPRESSION: No active cardiopulmonary disease.

## 2015-02-10 ENCOUNTER — Telehealth: Payer: Self-pay | Admitting: Neurology

## 2015-02-10 NOTE — Telephone Encounter (Signed)
Patient called inquiring if she could get samples of lacosamide (VIMPAT) 50 MG TABS tablet . Please call and advise. Patient can be reached at [phone] and [phone]

## 2015-02-11 ENCOUNTER — Other Ambulatory Visit: Payer: Self-pay

## 2015-02-11 MED ORDER — LACOSAMIDE 50 MG PO TABS: ORAL_TABLET | ORAL | Status: DC

## 2015-02-11 NOTE — Telephone Encounter (Signed)
Dr. Vickey Huger advised me to give 5 boxes of 50 mg tablets of Vimpat. Pt to take 50 mg in morning and 100 mg at night by mouth. Spoke to pt's mother, per DPR, and advised her that samples are available for pick up at the front desk.

## 2015-03-10 ENCOUNTER — Other Ambulatory Visit: Payer: Self-pay

## 2015-03-10 DIAGNOSIS — G40A09 Absence epileptic syndrome, not intractable, without status epilepticus: Secondary | ICD-10-CM

## 2015-03-10 MED ORDER — LACOSAMIDE 50 MG PO TABS: ORAL_TABLET | ORAL | Status: DC

## 2015-03-10 NOTE — Telephone Encounter (Signed)
Rx signed and faxed.

## 2015-03-12 ENCOUNTER — Telehealth: Payer: Self-pay | Admitting: Neurology

## 2015-03-12 NOTE — Telephone Encounter (Signed)
Patient is calling to get samples of lacosamide (VIMPAT) 50 MG TABS tablet and also Vimpat 100mg . Please call patient and advise. Thank you.

## 2015-03-12 NOTE — Telephone Encounter (Signed)
Error

## 2015-03-12 NOTE — Telephone Encounter (Signed)
I have spoken with pt. and advised 2 boxes of vimpat 100mg  and 2 boxes of vimpat 50mg  samples are up front [facility] for her to pick up.  Samples signed out in pharmacy and documented in med list/fim

## 2015-04-06 MED ORDER — LACOSAMIDE 100 MG PO TABS: 100.0000 mg | ORAL_TABLET | Freq: Every day | ORAL | Status: DC

## 2015-04-06 MED ORDER — LACOSAMIDE 50 MG PO TABS: 50.0000 mg | ORAL_TABLET | Freq: Every day | ORAL | Status: DC

## 2015-04-06 NOTE — Telephone Encounter (Addendum)
pts mother called and states that they brought documentation for the assistance program to this office for us to submit. Mother called the [employer](?) and they are telling her that they never received any paper work. They were told she is not eligble but can get a copay card from Vimpat. But still can not afford it. They were told she could send in a letter of appeals with financial info in , but they never received it. Mother is asking that this office resubmit the information to the assistance program. Please call and advise [phone] ( mother) . Pt will run out of medication in about a week. It is a new distributer for the medication - Fax: [phone]. Phone: [phone], mother spoke with an Amy.

## 2015-04-06 NOTE — Telephone Encounter (Signed)
I called [employer].  Spoke with Albin FellingCarla,  She verified they did receive all necessary info from us in Oct, however, they are still waiting for the patient to provide proof of income.  Says they contacted the patient's ins, and verified Vimpat is covered.  Co-pay is $130, but says they have advised the patient to use co-pay card, which will reduce the cost to $30 per fill.  Because of this, she stated the patient is not eligible for assistance.  Says they spoke with mother and advised they can try to appeal if they's like.  Says this is a letter that needs to be written by the patient herself explaining her current financial situation, and why she would be unable to pay $30 per month for this med.  Stated if for some reason this co-pay info was inaccurate, they will need documentation of what co-pay would be with discount card.  Says after the patient has provided them with this info, it will be reviewed and a decision will be made in 7 business days.  I called back and relayed this info to Ms Piedad ClimesFairfax.  She expressed understanding, and says they will try to get all of this info together as soon as possible.  They are aware samples are ready at the front desk.

## 2015-04-06 NOTE — Addendum Note (Signed)
Addended by: Geronimo RunningINKINS, Jabree Rebert A on: 04/06/2015 11:34 AM   Modules accepted: Orders

## 2015-04-06 NOTE — Telephone Encounter (Signed)
Vimpat samples at the front desk. Pt to take vimpat 50 mg in the morning and vimpat 100 mg in the evening both by mouth (per Dr. Oliva Bustardohmeier's last OV note.)  Samples at front desk and signed out in book.  Vimpat 50 mg 5 boxes of 14 tablets in each box- Lot: [id number] Exp:11/2018  Vimpat 100 mg 5 boxes of 14 tablets in each box- Lot:  [id number] Exp: 12/2018

## 2015-04-13 ENCOUNTER — Encounter: Payer: Self-pay | Admitting: Internal Medicine

## 2015-04-28 ENCOUNTER — Telehealth: Payer: Self-pay

## 2015-04-28 NOTE — Telephone Encounter (Signed)
Called patient to offer earlier appt w/ NP-Megan Millikan. Mother Lupita LeashDonna said she will have patient to call tomorrow.

## 2015-05-07 ENCOUNTER — Encounter: Payer: Self-pay | Admitting: Adult Health

## 2015-05-07 ENCOUNTER — Ambulatory Visit (INDEPENDENT_AMBULATORY_CARE_PROVIDER_SITE_OTHER): Payer: BLUE CROSS/BLUE SHIELD | Admitting: Adult Health

## 2015-05-07 VITALS — BP 106/69 | HR 75 | Ht 65.0 in | Wt 135.0 lb

## 2015-05-07 DIAGNOSIS — M2550 Pain in unspecified joint: Secondary | ICD-10-CM | POA: Diagnosis not present

## 2015-05-07 DIAGNOSIS — M2560 Stiffness of unspecified joint, not elsewhere classified: Secondary | ICD-10-CM

## 2015-05-07 DIAGNOSIS — G40A09 Absence epileptic syndrome, not intractable, without status epilepticus: Secondary | ICD-10-CM | POA: Diagnosis not present

## 2015-05-07 NOTE — Patient Instructions (Signed)
Continue Vimpat and Topamax Blood work for joint pain If your symptoms worsen or you develop new symptoms please let us know.

## 2015-05-07 NOTE — Progress Notes (Signed)
I agree with the assessment and plan as directed by NP .The patient is known to me . We will need to address a possible VNS should the patient continue having seizures .    Kerryn Tennant, MD

## 2015-05-07 NOTE — Progress Notes (Signed)
PATIENT: Jennifer Luna DOB: 1990/07/16  REASON FOR VISIT: follow up- seizures HISTORY FROM: patient  HISTORY OF PRESENT ILLNESS: Jennifer Luna is a 24 year old female with a history of seizures. She returns today for follow-up. She continues to take Vimpat, Topamax and clonazepam. She reports that she is not had any seizuressince the last visit. She is able to operate a motor vehicle without difficulty. She is able to complete all ADLs independently. She states that her seizures typically consist of staring spells that last for several seconds. She denies any changes in her mood or behavior. She does state that she's recently been very tired. She states that she works 4 hours a day and is also in [employer]. She states that lately she has been very busy. She denies any changes with her walking or balance. However her mother speaks up and states that she is very stiff for a 24 year old. She states that when she is gets out of bed in the morning she does feel stiff in her joints specifically the hips and knees. She states that when she gets going this improves. The mother states that she has fibromyalgia and is wondering if some of the symptoms that her daughter has maybe related to fibromyalgia as well. She returns today for an evaluation.  HISTORY 10/29/14: Jennifer Luna is a 24 y.o. female Is seen here as a revisit from Dr. Cleta Alberts. She has been one year seizure free and driving. She still has occasional little absence "blurps" but she has not had the loss of conversational flow and the secondary generalization. She had onset of seizures at age 21 . Her mother described the toddler coming up to her and sudden eye flutter and staring off, unresponsive.   Her last seizure in 2013 , April.  Driving to work, has 2 part time jobs. She has developed well, has become much more independent and can drive.   She's taking 2 Vimpat 100 mg by mouth twice a day, she's taking Topiramate 100 mg bid  po.  She's taking Klonopin or 0.5 mg a half tablet by mouth at bedtime.  She has been informed about a VNS treatment option, she doesn't need now as she doing so well.   REVIEW OF SYSTEMS: Out of a complete 14 system review of symptoms, the patient complains only of the following symptoms, and all other reviewed systems are negative.  Fatigue, unexpected weight change  ALLERGIES: Allergies  Allergen Reactions  . Penicillins Rash  . Sulfa Antibiotics Rash  . Erythromycin Rash    HOME MEDICATIONS: Outpatient Prescriptions Prior to Visit  Medication Sig Dispense Refill  . Ascorbic Acid (VITAMIN C PO) Take 1 tablet by mouth daily.    Marland Kitchen CAMRESE 0.15-0.03 &0.01 MG tablet Take 1 tablet by mouth daily.  3  . clonazePAM (KLONOPIN) 0.5 MG tablet Take 0.5 tablets (0.25 mg total) by mouth at bedtime. 45 tablet 1  . Cyanocobalamin (VITAMIN B-12 PO) Take 1 tablet by mouth daily.    . folic acid (FOLVITE) 1 MG tablet Take 1 mg by mouth daily.    Marland Kitchen ibuprofen (ADVIL,MOTRIN) 600 MG tablet Take 1 tablet (600 mg total) by mouth every 6 (six) hours as needed. 30 tablet 0  . influenza vac recombinant HA trivalent (FLUBLOK) injection Inject 0.5 mLs into the muscle once.    . Lacosamide (VIMPAT) 100 MG TABS Take 1 tablet (100 mg total) by mouth daily. Take 50 mg in the morning and 100 mg in the  evening. 70 tablet 0  . lacosamide (VIMPAT) 50 MG TABS tablet Take 50 mg in morning and 100 mg at night. 70 tablet 0  . lacosamide (VIMPAT) 50 MG TABS tablet Takes 50 mg in morning and 100 mg at bedtime 270 tablet 3  . lacosamide (VIMPAT) 50 MG TABS tablet Take 1 tablet (50 mg total) by mouth daily. Take 50 mg in the morning and 100 mg in the evening. 70 tablet 0  . PRESCRIPTION MEDICATION once. TB injection    . pseudoephedrine-guaifenesin (MUCINEX D) 60-600 MG per tablet Take 1 tablet by mouth every 12 (twelve) hours. 18 tablet 0  . simethicone (INFANTS SIMETHICONE) 40 MG/0.6ML drops Take 0.6 mLs (40 mg total) by  mouth 4 (four) times daily as needed. (Patient taking differently: Take 80 mg by mouth daily as needed for flatulence. ) 30 mL 0  . topiramate (TOPAMAX) 100 MG tablet 50 mg in AM and 100 mg in PM po. 135 tablet 3  . Topiramate ER 150 MG CS24 Take 150 mg by mouth 1 day or 1 dose. 90 each 3  . zonisamide (ZONEGRAN) 25 MG capsule Take 2 capsules (50 mg total) by mouth at bedtime. 360 capsule 3  . chlorpheniramine-HYDROcodone (TUSSIONEX PENNKINETIC ER) 10-8 MG/5ML LQCR Take 5 mLs by mouth every 12 (twelve) hours as needed. (Patient not taking: Reported on 05/07/2015) 60 mL 0  . Lacosamide (VIMPAT) 100 MG TABS Take 100 mg by mouth at bedtime.     No facility-administered medications prior to visit.    PAST MEDICAL HISTORY: Past Medical History  Diagnosis Date  . Seizures (HCC)     nx of absent and grand mal  . Thrombocytopenia (HCC) 01/11/12    office notes from last visit, with zarontin and depakote  . Sleepiness 01/11/2012    notes from office visit  . Amenorrhea 01/11/2012    notes from office visit, PERIMENTRUAL PAINS  . Epilepsy (HCC)     with absence seizures, abnormal EEG  . Sleepiness     DAYTIME  . Arthralgia 04/23/2013  . Shortness of breath 04/23/2013    PAST SURGICAL HISTORY: Past Surgical History  Procedure Laterality Date  . Shoulder injury      FAMILY HISTORY: Family History  Problem Relation Age of Onset  . Hypothyroidism Mother   . Arthritis Mother   . Hypertension Father   . Diabetes Father   . Hyperlipidemia Father   . Breast cancer Maternal Grandmother   . Heart disease Maternal Grandmother   . Hypertension Maternal Grandmother   . Heart disease Maternal Grandfather   . Heart disease Paternal Grandmother   . Rheum arthritis Paternal Grandmother   . Lupus Maternal Aunt   . Seizures Paternal Uncle     as a child  . Asthma Sister   . Rheum arthritis Maternal Aunt   . Asthma      Maternal [employer]   . Asthma Sister     had as a child    SOCIAL  HISTORY: Social History   Social History  . Marital Status: Single    Spouse Name: N/A  . Number of Children: 0  . Years of Education: 12   Occupational History  . STUDENT/[employer]   . STUDENT    Social History Main Topics  . Smoking status: Never Smoker   . Smokeless tobacco: Never Used  . Alcohol Use: No  . Drug Use: No  . Sexual Activity: Not on file   Other Topics Concern  .  Not on file   Social History Narrative   Patient is single and lives at home with her parents.   Patient is working part-time.   Patient has a high school education.   Patient is right-handed.   Patient drinks two cups of coffee.      PHYSICAL EXAM  Filed Vitals:   05/07/15 0902  BP: 106/69  Pulse: 75  Height: 5\' 5"  (1.651 m)  Weight: 135 lb (61.236 kg)   Body mass index is 22.47 kg/(m^2).  Generalized: Well developed, in no acute distress   Neurological examination  Mentation: Alert oriented to time, place, history taking. Follows all commands speech and language fluent Cranial nerve II-XII: Pupils were equal round reactive to light. Extraocular movements were full, visual field were full on confrontational test. Facial sensation and strength were normal. Uvula tongue midline. Head turning and shoulder shrug  were normal and symmetric. Motor: The motor testing reveals 5 over 5 strength of all 4 extremities. Good symmetric motor tone is noted throughout.  Sensory: Sensory testing is intact to soft touch on all 4 extremities. No evidence of extinction is noted.  Coordination: Cerebellar testing reveals good finger-nose-finger and heel-to-shin bilaterally.  Gait and station: Gait is normal. Tandem gait is normal. Romberg is negative. No drift is seen.  Reflexes: Deep tendon reflexes are symmetric and normal bilaterally.   DIAGNOSTIC DATA (LABS, IMAGING, TESTING) - I reviewed patient records, labs, notes, testing and imaging myself where available.   ASSESSMENT AND PLAN 24 y.o. year old  female  has a past medical history of Seizures (HCC); Thrombocytopenia (HCC) (01/11/12); Sleepiness (01/11/2012); Amenorrhea (01/11/2012); Epilepsy (HCC); Sleepiness; Arthralgia (04/23/2013); and Shortness of breath (04/23/2013). here with:  1. Seizures 2. Pain in joints and stiffness  Patient will continue on Vimpat, Topamax and clonazepam. Patient advised that she has any seizure she should let us know. Patient is having stiffness in the joints. I will check blood work today. If blood work is unremarkable she will need to follow-up with her primary care provider in regards to the stiffness. Patient verbalized understanding. She will follow-up in 6 months with Dr. Vergia Alconohmeier     Effie Wahlert, MSN, NP-C 05/07/2015, 9:47 AM [facility] [address], [address] [place], [place] [address] [phone]

## 2015-05-08 ENCOUNTER — Other Ambulatory Visit: Payer: Self-pay | Admitting: Neurology

## 2015-05-08 LAB — ANA W/REFLEX: ANA: NEGATIVE

## 2015-05-08 LAB — SEDIMENTATION RATE: SED RATE: 2 mm/h (ref 0–32)

## 2015-05-08 LAB — RHEUMATOID FACTOR

## 2015-05-12 ENCOUNTER — Telehealth: Payer: Self-pay

## 2015-05-12 MED ORDER — CLONAZEPAM 0.5 MG PO TABS: 0.2500 mg | ORAL_TABLET | Freq: Every day | ORAL | Status: DC

## 2015-05-12 NOTE — Telephone Encounter (Signed)
Patient called to request refill of clonazePAM (KLONOPIN) 0.5 MG tablet, was advised Rx has been approved by Dr. Vickey Hugerohmeier, Rx will be faxed to Pharmacy.

## 2015-05-12 NOTE — Telephone Encounter (Signed)
Spoke to pt and advised her that this RX for [facility]lonopin [facility]. Pt verbalized understanding.

## 2015-05-14 ENCOUNTER — Encounter (HOSPITAL_COMMUNITY): Payer: Self-pay | Admitting: *Deleted

## 2015-05-14 ENCOUNTER — Emergency Department (HOSPITAL_COMMUNITY)
Admission: EM | Admit: 2015-05-14 | Discharge: 2015-05-14 | Disposition: A | Discharge status: Home / Self Care | Payer: BLUE CROSS/BLUE SHIELD | Attending: Emergency Medicine | Admitting: Emergency Medicine

## 2015-05-14 DIAGNOSIS — G40909 Epilepsy, unspecified, not intractable, without status epilepticus: Secondary | ICD-10-CM | POA: Diagnosis not present

## 2015-05-14 DIAGNOSIS — N390 Urinary tract infection, site not specified: Secondary | ICD-10-CM | POA: Diagnosis not present

## 2015-05-14 DIAGNOSIS — Z862 Personal history of diseases of the blood and blood-forming organs and certain disorders involving the immune mechanism: Secondary | ICD-10-CM | POA: Diagnosis not present

## 2015-05-14 DIAGNOSIS — Z3202 Encounter for pregnancy test, result negative: Secondary | ICD-10-CM | POA: Insufficient documentation

## 2015-05-14 DIAGNOSIS — R112 Nausea with vomiting, unspecified: Secondary | ICD-10-CM

## 2015-05-14 DIAGNOSIS — Z79899 Other long term (current) drug therapy: Secondary | ICD-10-CM | POA: Insufficient documentation

## 2015-05-14 DIAGNOSIS — Z793 Long term (current) use of hormonal contraceptives: Secondary | ICD-10-CM | POA: Insufficient documentation

## 2015-05-14 DIAGNOSIS — R197 Diarrhea, unspecified: Secondary | ICD-10-CM | POA: Diagnosis not present

## 2015-05-14 DIAGNOSIS — Z880 Allergy status to penicillin: Secondary | ICD-10-CM | POA: Insufficient documentation

## 2015-05-14 LAB — COMPREHENSIVE METABOLIC PANEL
ALBUMIN: 3.7 g/dL (ref 3.5–5.0)
ALK PHOS: 42 U/L (ref 38–126)
ALT: 32 U/L (ref 14–54)
AST: 27 U/L (ref 15–41)
Anion gap: 11 (ref 5–15)
BUN: 19 mg/dL (ref 6–20)
CHLORIDE: 112 mmol/L — AB (ref 101–111)
CO2: 20 mmol/L — AB (ref 22–32)
CREATININE: 0.74 mg/dL (ref 0.44–1.00)
Calcium: 7.8 mg/dL — ABNORMAL LOW (ref 8.9–10.3)
GFR calc non Af Amer: >60 mL/min (ref >60)
GLUCOSE: 112 mg/dL — AB (ref 65–99)
Potassium: 4.2 mmol/L (ref 3.5–5.1)
SODIUM: 143 mmol/L (ref 135–145)
Total Bilirubin: 0.5 mg/dL (ref 0.3–1.2)
Total Protein: 6.4 g/dL — ABNORMAL LOW (ref 6.5–8.1)

## 2015-05-14 LAB — URINALYSIS, ROUTINE W REFLEX MICROSCOPIC
Bilirubin Urine: NEGATIVE
GLUCOSE, UA: NEGATIVE mg/dL
Hgb urine dipstick: NEGATIVE
KETONES UR: 15 mg/dL — AB
Nitrite: NEGATIVE
PH: 6 (ref 5.0–8.0)
Protein, ur: NEGATIVE mg/dL
SPECIFIC GRAVITY, URINE: 1.028 (ref 1.005–1.030)

## 2015-05-14 LAB — I-STAT CHEM 8, ED
BUN: 26 mg/dL — AB (ref 6–20)
CREATININE: 0.7 mg/dL (ref 0.44–1.00)
Calcium, Ion: 1.04 mmol/L — ABNORMAL LOW (ref 1.12–1.23)
Chloride: 110 mmol/L (ref 101–111)
GLUCOSE: 128 mg/dL — AB (ref 65–99)
HCT: 50 % — ABNORMAL HIGH (ref 36.0–46.0)
HEMOGLOBIN: 17 g/dL — AB (ref 12.0–15.0)
POTASSIUM: 5.8 mmol/L — AB (ref 3.5–5.1)
Sodium: 139 mmol/L (ref 135–145)
TCO2: 20 mmol/L (ref 0–100)

## 2015-05-14 LAB — URINE MICROSCOPIC-ADD ON

## 2015-05-14 LAB — CBC
HEMATOCRIT: 46.2 % — AB (ref 36.0–46.0)
Hemoglobin: 15.2 g/dL — ABNORMAL HIGH (ref 12.0–15.0)
MCH: 29.9 pg (ref 26.0–34.0)
MCHC: 32.9 g/dL (ref 30.0–36.0)
MCV: 90.9 fL (ref 78.0–100.0)
Platelets: 264 10*3/uL (ref 150–400)
RBC: 5.08 MIL/uL (ref 3.87–5.11)
RDW: 12.8 % (ref 11.5–15.5)
WBC: 8.2 10*3/uL (ref 4.0–10.5)

## 2015-05-14 LAB — LIPASE, BLOOD: Lipase: 21 U/L (ref 11–51)

## 2015-05-14 LAB — I-STAT BETA HCG BLOOD, ED (MC, WL, AP ONLY): I-stat hCG, quantitative: <5 m[IU]/mL (ref <5)

## 2015-05-14 MED ORDER — SODIUM CHLORIDE 0.9 % IV BOLUS (SEPSIS)
1000.0000 mL | Freq: Once | INTRAVENOUS | Status: AC
  Administered 2015-05-14: 1000 mL via INTRAVENOUS

## 2015-05-14 MED ORDER — ACETAMINOPHEN 325 MG PO TABS
650.0000 mg | ORAL_TABLET | Freq: Once | ORAL | Status: AC
  Administered 2015-05-14: 650 mg via ORAL
  Filled 2015-05-14: qty 2

## 2015-05-14 MED ORDER — CIPROFLOXACIN HCL 500 MG PO TABS
500.0000 mg | ORAL_TABLET | Freq: Once | ORAL | Status: AC
  Administered 2015-05-14: 500 mg via ORAL
  Filled 2015-05-14: qty 1

## 2015-05-14 MED ORDER — ONDANSETRON HCL 4 MG/2ML IJ SOLN
4.0000 mg | Freq: Once | INTRAMUSCULAR | Status: AC
  Administered 2015-05-14: 4 mg via INTRAVENOUS

## 2015-05-14 MED ORDER — CIPROFLOXACIN HCL 500 MG PO TABS: 500.0000 mg | ORAL_TABLET | Freq: Two times a day (BID) | ORAL | Status: DC

## 2015-05-14 MED ORDER — ONDANSETRON 8 MG PO TBDP: 8.0000 mg | ORAL_TABLET | Freq: Three times a day (TID) | ORAL | Status: DC | PRN

## 2015-05-14 MED ORDER — ONDANSETRON HCL 4 MG/2ML IJ SOLN
4.0000 mg | Freq: Once | INTRAMUSCULAR | Status: DC | PRN
  Filled 2015-05-14: qty 2

## 2015-05-14 NOTE — ED Provider Notes (Signed)
CSN: [id number]     Arrival date & time 05/14/15  4098 History   First MD Initiated Contact with Patient 05/14/15 [phone]     Chief Complaint  Patient presents with  . Emesis     (Consider location/radiation/quality/duration/timing/severity/associated sxs/prior Treatment) The history is provided by the patient.  Patient presents c/o nvd and abd cramping since yesterday evening. Denies known bad food ingestion, recent ill contacts, or abx use. Several episodes of emesis, clear to yellowish. Diarrhea watery, not bloody. Intermittent mid/diffuse abd crampy discomfort, no constant/focal abdominal pain. No fever or chills. No cough or uri c/o. No dysuria or gu c/o. Had normal period 1 week ago, no vaginal discharge or bleeding.      Past Medical History  Diagnosis Date  . Seizures (HCC)     nx of absent and grand mal  . Thrombocytopenia (HCC) 01/11/12    office notes from last visit, with zarontin and depakote  . Sleepiness 01/11/2012    notes from office visit  . Amenorrhea 01/11/2012    notes from office visit, PERIMENTRUAL PAINS  . Epilepsy (HCC)     with absence seizures, abnormal EEG  . Sleepiness     DAYTIME  . Arthralgia 04/23/2013  . Shortness of breath 04/23/2013   Past Surgical History  Procedure Laterality Date  . Shoulder injury     Family History  Problem Relation Age of Onset  . Hypothyroidism Mother   . Arthritis Mother   . Hypertension Father   . Diabetes Father   . Hyperlipidemia Father   . Breast cancer Maternal Grandmother   . Heart disease Maternal Grandmother   . Hypertension Maternal Grandmother   . Heart disease Maternal Grandfather   . Heart disease Paternal Grandmother   . Rheum arthritis Paternal Grandmother   . Lupus Maternal Aunt   . Seizures Paternal Uncle     as a child  . Asthma Sister   . Rheum arthritis Maternal Aunt   . Asthma      Maternal [employer]   . Asthma Sister     had as a child   Social History  Substance Use Topics  .  Smoking status: Never Smoker   . Smokeless tobacco: Never Used  . Alcohol Use: No   OB History    No data available     Review of Systems  Constitutional: Negative for fever and chills.  HENT: Negative for sore throat.   Eyes: Negative for redness.  Respiratory: Negative for cough and shortness of breath.   Cardiovascular: Negative for chest pain.  Gastrointestinal: Positive for vomiting, abdominal pain and diarrhea.  Genitourinary: Negative for dysuria and flank pain.  Musculoskeletal: Negative for back pain and neck pain.  Skin: Negative for rash.  Neurological: Negative for headaches.  Hematological: Does not bruise/bleed easily.  Psychiatric/Behavioral: Negative for confusion.      Allergies  Penicillins; Sulfa antibiotics; and Erythromycin  Home Medications   Prior to Admission medications   Medication Sig Start Date End Date Taking? Authorizing Provider  Ascorbic Acid (VITAMIN C PO) Take 1 tablet by mouth daily.    Historical Provider, MD  CAMRESE 0.15-0.03 &0.01 MG tablet Take 1 tablet by mouth daily. 01/31/14   Historical Provider, MD  clonazePAM (KLONOPIN) 0.5 MG tablet Take 0.5 tablets (0.25 mg total) by mouth at bedtime. 05/12/15   Porfirio Mylar Dohmeier, MD  Cyanocobalamin (VITAMIN B-12 PO) Take 1 tablet by mouth daily.    Historical Provider, MD  folic acid (FOLVITE) 1 MG  tablet Take 1 mg by mouth daily.    Historical Provider, MD  ibuprofen (ADVIL,MOTRIN) 600 MG tablet Take 1 tablet (600 mg total) by mouth every 6 (six) hours as needed. 06/23/14   Margarita Grizzle, MD  influenza vac recombinant HA trivalent (FLUBLOK) injection Inject 0.5 mLs into the muscle once.    Historical Provider, MD  Lacosamide (VIMPAT) 100 MG TABS Take 1 tablet (100 mg total) by mouth daily. Take 50 mg in the morning and 100 mg in the evening. 04/06/15   Porfirio Mylar Dohmeier, MD  lacosamide (VIMPAT) 50 MG TABS tablet Take 50 mg in morning and 100 mg at night. 02/11/15   Porfirio Mylar Dohmeier, MD  lacosamide  (VIMPAT) 50 MG TABS tablet Takes 50 mg in morning and 100 mg at bedtime 03/10/15   Melvyn Novas, MD  lacosamide (VIMPAT) 50 MG TABS tablet Take 1 tablet (50 mg total) by mouth daily. Take 50 mg in the morning and 100 mg in the evening. 04/06/15   Melvyn Novas, MD  PRESCRIPTION MEDICATION once. TB injection    Historical Provider, MD  pseudoephedrine-guaifenesin (MUCINEX D) 60-600 MG per tablet Take 1 tablet by mouth every 12 (twelve) hours. 07/21/14 07/21/15  Carmelina Dane, MD  simethicone (INFANTS SIMETHICONE) 40 MG/0.6ML drops Take 0.6 mLs (40 mg total) by mouth 4 (four) times daily as needed. Patient taking differently: Take 80 mg by mouth daily as needed for flatulence.  02/27/13   Porfirio Mylar Dohmeier, MD  topiramate (TOPAMAX) 100 MG tablet 50 mg in AM and 100 mg in PM po. 11/05/14   Melvyn Novas, MD  Topiramate ER 150 MG CS24 Take 150 mg by mouth 1 day or 1 dose. 10/29/14   Porfirio Mylar Dohmeier, MD  zonisamide (ZONEGRAN) 25 MG capsule Take 2 capsules (50 mg total) by mouth at bedtime. 10/29/14   Porfirio Mylar Dohmeier, MD   BP 116/76 mmHg  Pulse 102  Temp(Src) 97.8 F (36.6 C) (Oral)  Resp 16  SpO2 96%  LMP 05/04/2015 (Exact Date) Physical Exam  Constitutional: She appears well-developed and well-nourished. No distress.  HENT:  Mouth/Throat: Oropharynx is clear and moist.  Eyes: Conjunctivae are normal. No scleral icterus.  Neck: Neck supple. No tracheal deviation present. No thyromegaly present.  No stiffness or rigidity  Cardiovascular: Normal rate, regular rhythm, normal heart sounds and intact distal pulses.  Exam reveals no friction rub.   No murmur heard. Pulmonary/Chest: Effort normal and breath sounds normal. No respiratory distress.  Abdominal: Soft. Normal appearance and bowel sounds are normal. She exhibits no distension and no mass. There is no tenderness. There is no rebound and no guarding.  Genitourinary:  No cva tenderness  Musculoskeletal: She exhibits no edema.  TLS spine  non tender.   Neurological: She is alert.  Skin: Skin is warm and dry. No rash noted. She is not diaphoretic.  Psychiatric: She has a normal mood and affect.  Nursing note and vitals reviewed.   ED Course  Procedures (including critical care time) Labs Review  Results for orders placed or performed during the hospital encounter of 05/14/15  CBC  Result Value Ref Range   WBC 8.2 4.0 - 10.5 K/uL   RBC 5.08 3.87 - 5.11 MIL/uL   Hemoglobin 15.2 (H) 12.0 - 15.0 g/dL   HCT 40.9 (H) 81.1 - 91.4 %   MCV 90.9 78.0 - 100.0 fL   MCH 29.9 26.0 - 34.0 pg   MCHC 32.9 30.0 - 36.0 g/dL   RDW 78.2 95.6 - 21.3 %  Platelets 264 150 - 400 K/uL  Urinalysis, Routine w reflex microscopic ([facility])  Result Value Ref Range   Color, Urine YELLOW YELLOW   APPearance CLOUDY (A) CLEAR   Specific Gravity, Urine 1.028 1.005 - 1.030   pH 6.0 5.0 - 8.0   Glucose, UA NEGATIVE NEGATIVE mg/dL   Hgb urine dipstick NEGATIVE NEGATIVE   Bilirubin Urine NEGATIVE NEGATIVE   Ketones, ur 15 (A) NEGATIVE mg/dL   Protein, ur NEGATIVE NEGATIVE mg/dL   Nitrite NEGATIVE NEGATIVE   Leukocytes, UA MODERATE (A) NEGATIVE  Urine microscopic-add on  Result Value Ref Range   Squamous Epithelial / LPF 0-5 (A) NONE SEEN   WBC, UA 6-30 0 - 5 WBC/hpf   RBC / HPF 0-5 0 - 5 RBC/hpf   Bacteria, UA FEW (A) NONE SEEN  Comprehensive metabolic panel  Result Value Ref Range   Sodium 143 135 - 145 mmol/L   Potassium 4.2 3.5 - 5.1 mmol/L   Chloride 112 (H) 101 - 111 mmol/L   CO2 20 (L) 22 - 32 mmol/L   Glucose, Bld 112 (H) 65 - 99 mg/dL   BUN 19 6 - 20 mg/dL   Creatinine, Ser 1.610.74 0.44 - 1.00 mg/dL   Calcium 7.8 (L) 8.9 - 10.3 mg/dL   Total Protein 6.4 (L) 6.5 - 8.1 g/dL   Albumin 3.7 3.5 - 5.0 g/dL   AST 27 15 - 41 U/L   ALT 32 14 - 54 U/L   Alkaline Phosphatase 42 38 - 126 U/L   Total Bilirubin 0.5 0.3 - 1.2 mg/dL   GFR calc non Af Amer >60 >60 mL/min   GFR calc Af Amer >60 >60 mL/min   Anion gap 11 5 - 15  Lipase,  blood  Result Value Ref Range   Lipase 21 11 - 51 U/L  I-stat chem 8, ed  Result Value Ref Range   Sodium 139 135 - 145 mmol/L   Potassium 5.8 (H) 3.5 - 5.1 mmol/L   Chloride 110 101 - 111 mmol/L   BUN 26 (H) 6 - 20 mg/dL   Creatinine, Ser 0.960.70 0.44 - 1.00 mg/dL   Glucose, Bld 045128 (H) 65 - 99 mg/dL   Calcium, Ion 4.091.04 (L) 1.12 - 1.23 mmol/L   TCO2 20 0 - 100 mmol/L   Hemoglobin 17.0 (H) 12.0 - 15.0 g/dL   HCT 81.150.0 (H) 91.436.0 - 78.246.0 %  I-Stat beta hCG blood, ED ([facility], [facility], [facility] only)  Result Value Ref Range   I-stat hCG, quantitative <5.0 <5 mIU/mL   Comment 3               I have personally reviewed and evaluated these lab results as part of my medical decision-making.   MDM   Iv ns bolus. Labs. zofran iv.  Reviewed nursing notes and prior charts for additional history.   Additional ivf.   abd soft nt  ua w 6-30 wbc, bact - pt/fa requests rx uti. will rx.  Pt with allergy pcn, sulfa.  Will rx cipro.  Pt/fam requests nausea med for home.  Pt tolerating po fluids. abd soft nt.   Pt currently appears stable for d/c.      Cathren LaineKevin Zoei Amison, MD 05/14/15 1208

## 2015-05-14 NOTE — Discharge Instructions (Signed)
It was our pleasure to provide your ER care today - we hope that you feel better.  Rest. Drink plenty of fluids.  Take antibiotic as prescribed.  You may take zofran as need for nausea.  Follow up with primary care doctor in the next 1-2 days for recheck if symptoms fail to improve/resolve.  Return to ER if worse, persistent vomiting, worsening or severe abdominal pain, other concern.    Nausea and Vomiting Nausea is a sick feeling that often comes before throwing up (vomiting). Vomiting is a reflex where stomach contents come out of your mouth. Vomiting can cause severe loss of body fluids (dehydration). Children and elderly adults can become dehydrated quickly, especially if they also have diarrhea. Nausea and vomiting are symptoms of a condition or disease. It is important to find the cause of your symptoms. CAUSES   Direct irritation of the stomach lining. This irritation can result from increased acid production (gastroesophageal reflux disease), infection, food poisoning, taking certain medicines (such as nonsteroidal anti-inflammatory drugs), alcohol use, or tobacco use.  Signals from the brain.These signals could be caused by a headache, heat exposure, an inner ear disturbance, increased pressure in the brain from injury, infection, a tumor, or a concussion, pain, emotional stimulus, or metabolic problems.  An obstruction in the gastrointestinal tract (bowel obstruction).  Illnesses such as diabetes, hepatitis, gallbladder problems, appendicitis, kidney problems, cancer, sepsis, atypical symptoms of a heart attack, or eating disorders.  Medical treatments such as chemotherapy and radiation.  Receiving medicine that makes you sleep (general anesthetic) during surgery. DIAGNOSIS Your caregiver may ask for tests to be done if the problems do not improve after a few days. Tests may also be done if symptoms are severe or if the reason for the nausea and vomiting is not clear. Tests  may include:  Urine tests.  Blood tests.  Stool tests.  Cultures (to look for evidence of infection).  X-rays or other imaging studies. Test results can help your caregiver make decisions about treatment or the need for additional tests. TREATMENT You need to stay well hydrated. Drink frequently but in small amounts.You may wish to drink water, sports drinks, clear broth, or eat frozen ice pops or gelatin dessert to help stay hydrated.When you eat, eating slowly may help prevent nausea.There are also some antinausea medicines that may help prevent nausea. HOME CARE INSTRUCTIONS   Take all medicine as directed by your caregiver.  If you do not have an appetite, do not force yourself to eat. However, you must continue to drink fluids.  If you have an appetite, eat a normal diet unless your caregiver tells you differently.  Eat a variety of complex carbohydrates (rice, wheat, potatoes, bread), lean meats, yogurt, fruits, and vegetables.  Avoid high-fat foods because they are more difficult to digest.  Drink enough water and fluids to keep your urine clear or pale yellow.  If you are dehydrated, ask your caregiver for specific rehydration instructions. Signs of dehydration may include:  Severe thirst.  Dry lips and mouth.  Dizziness.  Dark urine.  Decreasing urine frequency and amount.  Confusion.  Rapid breathing or pulse. SEEK IMMEDIATE MEDICAL CARE IF:   You have blood or brown flecks (like coffee grounds) in your vomit.  You have black or bloody stools.  You have a severe headache or stiff neck.  You are confused.  You have severe abdominal pain.  You have chest pain or trouble breathing.  You do not urinate at least once every  8 hours.  You develop cold or clammy skin.  You continue to vomit for longer than 24 to 48 hours.  You have a fever. MAKE SURE YOU:   Understand these instructions.  Will watch your condition.  Will get help right away  if you are not doing well or get worse.   This information is not intended to replace advice given to you by your health care provider. Make sure you discuss any questions you have with your health care provider.   Document Released: 05/02/2005 Document Revised: 07/25/2011 Document Reviewed: 09/29/2010 Elsevier Interactive Patient Education 2016 Else[employer] Inc[employer]  Diarrhea Diarrhea is frequent loose and watery bowel movements. It can cause you to feel weak and dehydrated. Dehydration can cause you to become tired and thirsty, have a dry mouth, and have decreased urination that often is dark yellow. Diarrhea is a sign of another problem, most often an infection that will not last long. In most cases, diarrhea typically lasts 2-3 days. However, it can last longer if it is a sign of something more serious. It is important to treat your diarrhea as directed by your caregiver to lessen or prevent future episodes of diarrhea. CAUSES  Some common causes include:  Gastrointestinal infections caused by viruses, bacteria, or parasites.  Food poisoning or food allergies.  Certain medicines, such as antibiotics, chemotherapy, and laxatives.  Artificial sweeteners and fructose.  Digestive disorders. HOME CARE INSTRUCTIONS  Ensure adequate fluid intake (hydration): Have 1 cup (8 oz) of fluid for each diarrhea episode. Avoid fluids that contain simple sugars or sports drinks, fruit juices, whole milk products, and sodas. Your urine should be clear or pale yellow if you are drinking enough fluids. Hydrate with an oral rehydration solution that you can purchase at pharmacies, retail stores, and online. You can prepare an oral rehydration solution at home by mixing the following ingredients together:   - tsp table salt.   tsp baking soda.   tsp salt substitute containing potassium chloride.  1  tablespoons sugar.  1 L (34 oz) of water.  Certain foods and beverages may increase the speed at which food  moves through the gastrointestinal (GI) tract. These foods and beverages should be avoided and include:  Caffeinated and alcoholic beverages.  High-fiber foods, such as raw fruits and vegetables, nuts, seeds, and whole grain breads and cereals.  Foods and beverages sweetened with sugar alcohols, such as xylitol, sorbitol, and mannitol.  Some foods may be well tolerated and may help thicken stool including:  Starchy foods, such as rice, toast, pasta, low-sugar cereal, oatmeal, grits, baked potatoes, crackers, and bagels.  Bananas.  Applesauce.  Add probiotic-rich foods to help increase healthy bacteria in the GI tract, such as yogurt and fermented milk products.  Wash your hands well after each diarrhea episode.  Only take over-the-counter or prescription medicines as directed by your caregiver.  Take a warm bath to relieve any burning or pain from frequent diarrhea episodes. SEEK IMMEDIATE MEDICAL CARE IF:   You are unable to keep fluids down.  You have persistent vomiting.  You have blood in your stool, or your stools are black and tarry.  You do not urinate in 6-8 hours, or there is only a small amount of very dark urine.  You have abdominal pain that increases or localizes.  You have weakness, dizziness, confusion, or light-headedness.  You have a severe headache.  Your diarrhea gets worse or does not get better.  You have a fever or persistent symptoms  for more than 2-3 days.  You have a fever and your symptoms suddenly get worse. MAKE SURE YOU:   Understand these instructions.  Will watch your condition.  Will get help right away if you are not doing well or get worse.   This information is not intended to replace advice given to you by your health care provider. Make sure you discuss any questions you have with your health care provider.   Document Released: 04/22/2002 Document Revised: 05/23/2014 Document Reviewed: 01/08/2012 Elsevier Interactive Patient  Education 2016 Elsevier Inc.  Viral Gastroenteritis Viral gastroenteritis is also known as stomach flu. This condition affects the stomach and intestinal tract. It can cause sudden diarrhea and vomiting. The illness typically lasts 3 to 8 days. Most people develop an immune response that eventually gets rid of the virus. While this natural response develops, the virus can make you quite ill. CAUSES  Many different viruses can cause gastroenteritis, such as rotavirus or noroviruses. You can catch one of these viruses by consuming contaminated food or water. You may also catch a virus by sharing utensils or other personal items with an infected person or by touching a contaminated surface. SYMPTOMS  The most common symptoms are diarrhea and vomiting. These problems can cause a severe loss of body fluids (dehydration) and a body salt (electrolyte) imbalance. Other symptoms may include:  Fever.  Headache.  Fatigue.  Abdominal pain. DIAGNOSIS  Your caregiver can usually diagnose viral gastroenteritis based on your symptoms and a physical exam. A stool sample may also be taken to test for the presence of viruses or other infections. TREATMENT  This illness typically goes away on its own. Treatments are aimed at rehydration. The most serious cases of viral gastroenteritis involve vomiting so severely that you are not able to keep fluids down. In these cases, fluids must be given through an intravenous line (IV). HOME CARE INSTRUCTIONS   Drink enough fluids to keep your urine clear or pale yellow. Drink small amounts of fluids frequently and increase the amounts as tolerated.  Ask your caregiver for specific rehydration instructions.  Avoid:  Foods high in sugar.  Alcohol.  Carbonated drinks.  Tobacco.  Juice.  Caffeine drinks.  Extremely hot or cold fluids.  Fatty, greasy foods.  Too much intake of anything at one time.  Dairy products until 24 to 48 hours after diarrhea  stops.  You may consume probiotics. Probiotics are active cultures of beneficial bacteria. They may lessen the amount and number of diarrheal stools in adults. Probiotics can be found in yogurt with active cultures and in supplements.  Wash your hands well to avoid spreading the virus.  Only take over-the-counter or prescription medicines for pain, discomfort, or fever as directed by your caregiver. Do not give aspirin to children. Antidiarrheal medicines are not recommended.  Ask your caregiver if you should continue to take your regular prescribed and over-the-counter medicines.  Keep all follow-up appointments as directed by your caregiver. SEEK IMMEDIATE MEDICAL CARE IF:   You are unable to keep fluids down.  You do not urinate at least once every 6 to 8 hours.  You develop shortness of breath.  You notice blood in your stool or vomit. This may look like coffee grounds.  You have abdominal pain that increases or is concentrated in one small area (localized).  You have persistent vomiting or diarrhea.  You have a fever.  The patient is a child younger than 3 months, and he or she has a  fever.  The patient is a child older than 3 months, and he or she has a fever and persistent symptoms.  The patient is a child older than 3 months, and he or she has a fever and symptoms suddenly get worse.  The patient is a baby, and he or she has no tears when crying. MAKE SURE YOU:   Understand these instructions.  Will watch your condition.  Will get help right away if you are not doing well or get worse.   This information is not intended to replace advice given to you by your health care provider. Make sure you discuss any questions you have with your health care provider.   Document Released: 05/02/2005 Document Revised: 07/25/2011 Document Reviewed: 02/16/2011 Elsevier Interactive Patient Education 2016 Elsevier Inc.   Urinary Tract Infection Urinary tract infections (UTIs)  can develop anywhere along your urinary tract. Your urinary tract is your body's drainage system for removing wastes and extra water. Your urinary tract includes two kidneys, two ureters, a bladder, and a urethra. Your kidneys are a pair of bean-shaped organs. Each kidney is about the size of your fist. They are located below your ribs, one on each side of your spine. CAUSES Infections are caused by microbes, which are microscopic organisms, including fungi, viruses, and bacteria. These organisms are so small that they can only be seen through a microscope. Bacteria are the microbes that most commonly cause UTIs. SYMPTOMS  Symptoms of UTIs may vary by age and gender of the patient and by the location of the infection. Symptoms in young women typically include a frequent and intense urge to urinate and a painful, burning feeling in the bladder or urethra during urination. Older women and men are more likely to be tired, shaky, and weak and have muscle aches and abdominal pain. A fever may mean the infection is in your kidneys. Other symptoms of a kidney infection include pain in your back or sides below the ribs, nausea, and vomiting. DIAGNOSIS To diagnose a UTI, your caregiver will ask you about your symptoms. Your caregiver will also ask you to provide a urine sample. The urine sample will be tested for bacteria and white blood cells. White blood cells are made by your body to help fight infection. TREATMENT  Typically, UTIs can be treated with medication. Because most UTIs are caused by a bacterial infection, they usually can be treated with the use of antibiotics. The choice of antibiotic and length of treatment depend on your symptoms and the type of bacteria causing your infection. HOME CARE INSTRUCTIONS  If you were prescribed antibiotics, take them exactly as your caregiver instructs you. Finish the medication even if you feel better after you have only taken some of the medication.  Drink enough  water and fluids to keep your urine clear or pale yellow.  Avoid caffeine, tea, and carbonated beverages. They tend to irritate your bladder.  Empty your bladder often. Avoid holding urine for long periods of time.  Empty your bladder before and after sexual intercourse.  After a bowel movement, women should cleanse from front to back. Use each tissue only once. SEEK MEDICAL CARE IF:   You have back pain.  You develop a fever.  Your symptoms do not begin to resolve within 3 days. SEEK IMMEDIATE MEDICAL CARE IF:   You have severe back pain or lower abdominal pain.  You develop chills.  You have nausea or vomiting.  You have continued burning or discomfort with urination.  MAKE SURE YOU:   Understand these instructions.  Will watch your condition.  Will get help right away if you are not doing well or get worse.   This information is not intended to replace advice given to you by your health care provider. Make sure you discuss any questions you have with your health care provider.   Document Released: 02/09/2005 Document Revised: 01/21/2015 Document Reviewed: 06/10/2011 Elsevier Interactive Patient Education [employer]

## 2015-05-14 NOTE — ED Notes (Signed)
Pt reports abdominal pain 7/10 since last night, since last night has vomited 10x. Vomiting in ED. reportslast bowel movement loose/ diarrhea around 12/19 0500. Also reports back pain x1 week, across shoulders and down spine. Denies blood in urine or stool. Denies dysuria.

## 2015-05-18 ENCOUNTER — Telehealth: Payer: Self-pay | Admitting: Adult Health

## 2015-05-18 NOTE — Telephone Encounter (Signed)
I called the patient and left a message. I will try call back tomorrow morning.

## 2015-05-19 ENCOUNTER — Telehealth: Payer: Self-pay

## 2015-05-19 NOTE — Telephone Encounter (Addendum)
Pt called back to get results of lab work. Please call and advise [phone] Home or cell [phone]

## 2015-05-19 NOTE — Telephone Encounter (Signed)
-----   Message from Butch PennyMegan Millikan, NP sent at 05/19/2015  7:35 AM EST ----- Lab work unremarkable- please call the patient.

## 2015-05-19 NOTE — Telephone Encounter (Signed)
Called patient. Gave lab results. Patient verbalized understanding.  

## 2015-05-19 NOTE — Telephone Encounter (Signed)
Called patient. Gave results. Noted other phone note.

## 2015-05-22 ENCOUNTER — Telehealth: Payer: Self-pay | Admitting: Neurology

## 2015-05-22 NOTE — Telephone Encounter (Signed)
I called back again.  Spoke with [place].  She verified the Rx has already been validated.  Says she is not sure why they called us back.  She placed me on hold and checked with pharmacist as well, and stated Rx was verified, nothing further is needed from us.

## 2015-05-22 NOTE — Telephone Encounter (Signed)
Duplicate task.  Please see encounter from today's date.

## 2015-05-22 NOTE — Telephone Encounter (Signed)
I called [facility] at the number provided.  Spoke with Jennifer Luna.  She said they cancelled the Rx due to patient not returning their calls.  Said she would have to transfer me to Patient Assist, as there is nothing further she is able to do.  I was transferred to PAP.  They said they were not able to assist, and [facility] should have helped instead of transferring.  She placed me on hold and spoke with Jennifer Luna who verified [facility] will have to reopen the case.  I was then transferred back to [facility].  Spoke with Jennifer Luna again.  She stated we cannot request the refill for the patient, either the patient or her mother have to call and request refill.  Says if she calls before 1pm today, they should be able to have the meds there by Tuesday.  She was not able to assist with reopening the case and transferred me to pharmacist Jennifer Luna.  I verified Rx, and they will proceed with reopening case.  I called and spoke with Jennifer Luna.  She expressed understanding and said they will contact program today for refill.  She does have enough meds to last through next week, so order should arrive prior to running out.  They will call us back if anything further is needed.

## 2015-05-22 NOTE — Telephone Encounter (Addendum)
Pts mother called and says she spoke with an Amy with [facility] today and was told the pt's case has been closed because this office has not responded to any of their calls. Pt's mother called them because the pt received a letter indicating her case was closed. I have looked at previous tele notes and do not see where [facility] has tried to contact this office. The pt did send a letter about her financial and was sent and was approved. The mother is being told because our office did not send a Rx in to the specialty pharmacy they closed her case. [facility] says that it can be reopened and will call this office. Pt's mother says she will run out of medication and will need about a months worth of samples if possible until the Rx arrives from the specialty pharmacy. Please call and advise [phone]. May also call [facility] at [phone], Amy. Pt is almost out of medication and would like to know if samples can be given today.

## 2015-05-22 NOTE — Telephone Encounter (Signed)
Jennifer PaganiniAudrey with [facility] Patient  Assistance is calling. She states the Rx for Vimpat has to be validated. Please call the Patient Assistance Pharmacy at [phone] Option 2. Thank you.

## 2015-05-22 NOTE — Telephone Encounter (Signed)
Amy/[facility] Medication Assistance [phone] called regarding lacosamide (VIMPAT) 50 MG TABS tablet, states Pharmacy needs verification of this prescription, Rx is denied until then. Please [facility] call [phone].

## 2015-07-01 ENCOUNTER — Ambulatory Visit (INDEPENDENT_AMBULATORY_CARE_PROVIDER_SITE_OTHER): Payer: BLUE CROSS/BLUE SHIELD | Admitting: Physician Assistant

## 2015-07-01 VITALS — BP 122/74 | HR 98 | Temp 98.9°F | Resp 17 | Ht 64.5 in | Wt 133.0 lb

## 2015-07-01 DIAGNOSIS — J029 Acute pharyngitis, unspecified: Secondary | ICD-10-CM | POA: Diagnosis not present

## 2015-07-01 LAB — POCT RAPID STREP A (OFFICE): Rapid Strep A Screen: NEGATIVE

## 2015-07-01 MED ORDER — CLINDAMYCIN HCL 300 MG PO CAPS: 300.0000 mg | ORAL_CAPSULE | Freq: Three times a day (TID) | ORAL | Status: DC

## 2015-07-01 NOTE — Progress Notes (Signed)
07/01/2015 8:51 AM   DOB: 1991/03/03 / MRN: [id number]  SUBJECTIVE:  Jennifer Luna is a 25 y.o. female presenting for sore throat that started 4 days ago.  Associates fever and odynophagia.  Denies cough, nasal congestion. Has tried OTC cold preps with minimal relief. She takes OCPs and has no missed doses.    She is allergic to penicillins; sulfa antibiotics; and erythromycin.   She  has a past medical history of Seizures (HCC); Thrombocytopenia (HCC) (01/11/12); Sleepiness (01/11/2012); Amenorrhea (01/11/2012); Epilepsy (HCC); Sleepiness; Arthralgia (04/23/2013); and Shortness of breath (04/23/2013).    She  reports that she has never smoked. She has never used smokeless tobacco. She reports that she does not drink alcohol or use illicit drugs. She  has no sexual activity history on file. The patient  has past surgical history that includes shoulder injury.  Her family history includes Arthritis in her mother; Asthma in her sister and sister; Breast cancer in her maternal grandmother; Diabetes in her father; Heart disease in her maternal grandfather, maternal grandmother, and paternal grandmother; Hyperlipidemia in her father; Hypertension in her father and maternal grandmother; Hypothyroidism in her mother; Lupus in her maternal aunt; Rheum arthritis in her maternal aunt and paternal grandmother; Seizures in her paternal uncle.  Review of Systems  Constitutional: Positive for malaise/fatigue. Negative for fever, chills and diaphoresis.  HENT: Positive for sore throat. Negative for congestion.   Respiratory: Negative for cough, hemoptysis, shortness of breath and wheezing.   Cardiovascular: Negative for chest pain.  Gastrointestinal: Negative for nausea.  Skin: Negative for rash.  Neurological: Negative for dizziness and weakness.  Endo/Heme/Allergies: Negative for polydipsia.    Problem list and medications reviewed and updated by myself where necessary, and exist elsewhere in the  encounter.   OBJECTIVE:  BP 122/74 mmHg  Pulse 98  Temp(Src) 98.9 F (37.2 C) (Oral)  Resp 17  Ht 5' 4.5" (1.638 m)  Wt 133 lb (60.328 kg)  BMI 22.48 kg/m2  SpO2 97%  LMP 04/30/2015  Physical Exam  Constitutional: She is oriented to person, place, and time. She appears well-developed.  Eyes: EOM are normal. Pupils are equal, round, and reactive to light.  Cardiovascular: Normal rate.   Pulmonary/Chest: Effort normal.  Abdominal: She exhibits no distension.  Musculoskeletal: Normal range of motion.  Neurological: She is alert and oriented to person, place, and time. No cranial nerve deficit.  Skin: Skin is warm and dry. She is not diaphoretic.  Psychiatric: She has a normal mood and affect.  Vitals reviewed.   Results for orders placed or performed in visit on 07/01/15 (from the past 72 hour(s))  POCT rapid strep A     Status: None   Collection Time: 07/01/15  8:45 AM  Result Value Ref Range   Rapid Strep A Screen Negative Negative    No results found.  ASSESSMENT AND PLAN  Jennifer Luna was seen today for sore throat and dysphagia.  Diagnoses and all orders for this visit:  Sore throat: Rapid is negative.  She lacks other symptoms.  Will treat while awaiting culture.   -     POCT rapid strep A -     Culture, Group A Strep -     clindamycin (CLEOCIN) 300 MG capsule; Take 1 capsule (300 mg total) by mouth 3 (three) times daily.    The patient was advised to call or return to clinic if she does not see an improvement in symptoms or to seek the care of the closest  emergency department if she worsens with the above plan.   Deliah Boston, MHS, PA-C [facility] 07/01/2015 8:51 AM

## 2015-07-03 LAB — CULTURE, GROUP A STREP

## 2015-07-27 ENCOUNTER — Telehealth: Payer: Self-pay | Admitting: Neurology

## 2015-07-27 NOTE — Telephone Encounter (Signed)
Jennifer Luna is calling about a possible the medication Zonisamide 25 mg that you recently prescribed and states that this medication is not normally taken with topiramate which is is currently taking.  She is asking if you would like to prescribe something else.  Please call.

## 2015-07-27 NOTE — Telephone Encounter (Signed)
[facility] is concerned that Dr. Vickey Hugerohmeier is prescribing topamax and zonisamide for pt, when both of these drugs are usually not prescribed together. They want to know if Dr. Vickey Hugerohmeier will change the zonsamide.   Dr. Vickey Hugerohmeier, do you want to switch the pt's medications around? Pt has been on zonisamide and topamax for years.

## 2015-07-27 NOTE — Telephone Encounter (Signed)
The patient has been on this combination of zonisamide and topiramate for many years. Zonisamide can be discontinued it is more often used as a headache preventer than a seizure preventer. I will leave it to the patient if she would like to switch but I am not concerned about the safety of these medications together given her long-standing experience with both. Jennifer Luna

## 2015-07-27 NOTE — Telephone Encounter (Signed)
[facility] [place] [facility] [facility] and advised her that Dr. Vickey Hugerohmeier is not concerned about pt taking zonisamide and topamax together since pt has taken these for so long. If pt wants to switch, she may. Hayley verbalized understanding.

## 2015-08-21 DIAGNOSIS — R194 Change in bowel habit: Secondary | ICD-10-CM | POA: Diagnosis not present

## 2015-11-05 ENCOUNTER — Encounter: Payer: Self-pay | Admitting: Neurology

## 2015-11-05 ENCOUNTER — Ambulatory Visit (INDEPENDENT_AMBULATORY_CARE_PROVIDER_SITE_OTHER): Payer: BLUE CROSS/BLUE SHIELD | Admitting: Neurology

## 2015-11-05 VITALS — BP 110/68 | HR 78 | Resp 20 | Ht 65.0 in | Wt 132.0 lb

## 2015-11-05 DIAGNOSIS — G40309 Generalized idiopathic epilepsy and epileptic syndromes, not intractable, without status epilepticus: Secondary | ICD-10-CM | POA: Insufficient documentation

## 2015-11-05 DIAGNOSIS — G40A09 Absence epileptic syndrome, not intractable, without status epilepticus: Secondary | ICD-10-CM | POA: Diagnosis not present

## 2015-11-05 DIAGNOSIS — R5601 Complex febrile convulsions: Secondary | ICD-10-CM

## 2015-11-05 MED ORDER — TOPIRAMATE 100 MG PO TABS: 50.0000 mg | ORAL_TABLET | Freq: Two times a day (BID) | ORAL | Status: DC

## 2015-11-05 MED ORDER — CLONAZEPAM 0.5 MG PO TABS: 0.2500 mg | ORAL_TABLET | Freq: Every day | ORAL | Status: DC

## 2015-11-05 MED ORDER — LACOSAMIDE 50 MG PO TABS: 50.0000 mg | ORAL_TABLET | Freq: Two times a day (BID) | ORAL | Status: DC

## 2015-11-05 MED ORDER — ZONISAMIDE 25 MG PO CAPS: 50.0000 mg | ORAL_CAPSULE | Freq: Every day | ORAL | Status: DC

## 2015-11-05 NOTE — Progress Notes (Signed)
PATIENT: Jennifer Luna DOB: 11-10-1990  REASON FOR VISIT: follow up- seizures HISTORY FROM: patient  HISTORY OF PRESENT ILLNESS:   11-05-2015,  Jennifer Luna is a 25 year old female with a history of seizures. She continues to take Vimpat, Topamax and clonazepam. She reports that she is not had any seizuressince the last visit. She is able to operate a motor vehicle without difficulty. She is able to complete all ADLs independently. She states that her seizures typically consist of staring spells that last for several seconds. She denies any changes in her mood or behavior. She does state that she's recently been very tired. She states that she works 4 hours a day and is also in [employer]. She states that lately she has been very busy. She denies any changes with her walking or balance. However her mother speaks up and states that she is very stiff for a 25 year old.  She states that when she is gets out of bed in the morning she does feel stiff in her joints specifically the hips and knees. She states that when she gets going this improves.  The mother states that she has fibromyalgia and is wondering if some of the symptoms that her daughter has maybe related to fibromyalgia as well. She returns today to obtain refills for her antiepileptic medication. She also described recently a spell where she had some myoclonic jerking in the right elbow and right knee, she was out in public and left the event concerned that this may proceed a seizure but this did not happen. Her last recorded seizures from 2013. Jennifer Luna has been driving, she is currently in summer break from school.  HISTORY 10/29/14: Jennifer Luna L Luna is a 25 y.o. female Is seen here as a revisit from Dr. Cleta Albertsaub. She has been one year seizure free and driving. She still has occasional little absence "blurps" but she has not had the loss of conversational flow and the secondary generalization. She had onset of seizures at age 89 .  Her mother described the toddler coming up to her and sudden eye flutter and staring off, unresponsive.   Her last seizure in 2013 , April.  Driving to work, has 2 part time jobs. She has developed well, has become much more independent and can drive.   She's taking 2 Vimpat 100 mg by mouth twice a day, she's taking Topiramate 100 mg bid po.  She's taking Klonopin or 0.5 mg a half tablet by mouth at bedtime.  She has been informed about a VNS treatment option, she doesn't want now.    REVIEW OF SYSTEMS: Out of a complete 14 system review of symptoms, the patient complains only of the following symptoms, and all other reviewed systems are negative.  Fatigue, unexpected weight change  ALLERGIES: Allergies  Allergen Reactions  . Penicillins Rash    Has patient had a PCN reaction causing immediate rash, facial/tongue/throat swelling, SOB or lightheadedness with hypotension: No Has patient had a PCN reaction causing severe rash involving mucus membranes or skin necrosis: Yes Has patient had a PCN reaction that required hospitalization No Has patient had a PCN reaction occurring within the last 10 years: No If all of the above answers are "NO", then may proceed with Cephalosporin use.   . Sulfa Antibiotics Rash  . Erythromycin Rash    HOME MEDICATIONS: Outpatient Prescriptions Prior to Visit  Medication Sig Dispense Refill  . Ascorbic Acid (VITAMIN C PO) Take 1 tablet by mouth daily.    .Marland Kitchen  CAMRESE 0.15-0.03 &0.01 MG tablet Take 1 tablet by mouth daily.  3  . Cyanocobalamin (VITAMIN B-12 PO) Take 1 tablet by mouth daily.    Marland Kitchen ibuprofen (ADVIL,MOTRIN) 600 MG tablet Take 1 tablet (600 mg total) by mouth every 6 (six) hours as needed. (Patient taking differently: Take 600 mg by mouth every 6 (six) hours as needed for moderate pain. ) 30 tablet 0  . simethicone (INFANTS SIMETHICONE) 40 MG/0.6ML drops Take 0.6 mLs (40 mg total) by mouth 4 (four) times daily as needed. (Patient taking  differently: Take 40 mg by mouth 4 (four) times daily as needed for flatulence. ) 30 mL 0  . Topiramate ER 150 MG CS24 Take 150 mg by mouth 1 day or 1 dose. 90 each 3  . clonazePAM (KLONOPIN) 0.5 MG tablet Take 0.5 tablets (0.25 mg total) by mouth at bedtime. 45 tablet 1  . Lacosamide (VIMPAT) 100 MG TABS Take 1 tablet (100 mg total) by mouth daily. Take 50 mg in the morning and 100 mg in the evening. 70 tablet 0  . lacosamide (VIMPAT) 50 MG TABS tablet Take 50 mg in morning and 100 mg at night. 70 tablet 0  . lacosamide (VIMPAT) 50 MG TABS tablet Takes 50 mg in morning and 100 mg at bedtime 270 tablet 3  . lacosamide (VIMPAT) 50 MG TABS tablet Take 1 tablet (50 mg total) by mouth daily. Take 50 mg in the morning and 100 mg in the evening. (Patient taking differently: Take 50-100 mg by mouth 2 (two) times daily. Take 50 mg in the morning and 100 mg in the evening.) 70 tablet 0  . topiramate (TOPAMAX) 100 MG tablet 50 mg in AM and 100 mg in PM po. (Patient taking differently: Take 50-100 mg by mouth 2 (two) times daily. 50 mg in AM and 100 mg in PM po.) 135 tablet 3  . zonisamide (ZONEGRAN) 25 MG capsule Take 2 capsules (50 mg total) by mouth at bedtime. 360 capsule 3  . clindamycin (CLEOCIN) 300 MG capsule Take 1 capsule (300 mg total) by mouth 3 (three) times daily. 30 capsule 10   No facility-administered medications prior to visit.    PAST MEDICAL HISTORY: Past Medical History  Diagnosis Date  . Seizures (HCC)     nx of absent and grand mal  . Thrombocytopenia (HCC) 01/11/12    office notes from last visit, with zarontin and depakote  . Sleepiness 01/11/2012    notes from office visit  . Amenorrhea 01/11/2012    notes from office visit, PERIMENTRUAL PAINS  . Epilepsy (HCC)     with absence seizures, abnormal EEG  . Sleepiness     DAYTIME  . Arthralgia 04/23/2013  . Shortness of breath 04/23/2013    PAST SURGICAL HISTORY: Past Surgical History  Procedure Laterality Date  .  Shoulder injury      FAMILY HISTORY: Family History  Problem Relation Age of Onset  . Hypothyroidism Mother   . Arthritis Mother   . Hypertension Father   . Diabetes Father   . Hyperlipidemia Father   . Breast cancer Maternal Grandmother   . Heart disease Maternal Grandmother   . Hypertension Maternal Grandmother   . Heart disease Maternal Grandfather   . Heart disease Paternal Grandmother   . Rheum arthritis Paternal Grandmother   . Lupus Maternal Aunt   . Seizures Paternal Uncle     as a child  . Asthma Sister   . Rheum arthritis Maternal Aunt   .  Asthma      Maternal [employer] Uncle   . Asthma Sister     had as a child    SOCIAL HISTORY: Social History   Social History  . Marital Status: Single    Spouse Name: N/A  . Number of Children: 0  . Years of Education: 12   Occupational History  . STUDENT/[employer]   . STUDENT    Social History Main Topics  . Smoking status: Never Smoker   . Smokeless tobacco: Never Used  . Alcohol Use: No  . Drug Use: No  . Sexual Activity: Not on file   Other Topics Concern  . Not on file   Social History Narrative   Patient is single and lives at home with her parents.   Patient is working part-time.   Patient has a high school education.   Patient is right-handed.   Patient drinks two cups of coffee.      PHYSICAL EXAM  Filed Vitals:   11/05/15 0836  BP: 110/68  Pulse: 78  Resp: 20  Height: 5\' 5"  (1.651 m)  Weight: 132 lb (59.875 kg)   Body mass index is 21.97 kg/(m^2).  Generalized: Well developed, in no acute distress   Neurological examination  Mentation: Alert oriented to time, place, history taking. Follows all commands speech and language fluent Cranial nerve :  No change in taste or smell- Pupils were equal round reactive to light. Extraocular movements were full, visual field were full on confrontational test.  Facial sensation and strength were normal. Uvula tongue midline. Head turning and shoulder  shrug  were normal and symmetric. Motor: 5 /5 strength.  symmetric motor tone throughout.  Sensory: Sensory testing is intact to soft touch . Coordination: Cerebellar testing reveals good finger-nose-finger and heel-to-shin bilaterally.  Gait and station: Gait is normal.  Tandem gait is normal. Romberg is negative. No drift is seen.  Reflexes: Deep tendon reflexes are symmetric and normal bilaterally.   DIAGNOSTIC DATA (LABS, IMAGING, TESTING) - I reviewed patient records, labs, notes, testing and imaging myself where available. CMET q 6 month.   ASSESSMENT AND PLAN - 25 minute visit.  25 y.o. year old afro american right handed  female  has a past medical history of Seizures (HCC); Thrombocytopenia (HCC) (01/11/12); Sleepiness (01/11/2012); Amenorrhea (01/11/2012); Epilepsy (HCC); Sleepiness; Arthralgia (04/23/2013); and Shortness of breath (04/23/2013).   here with: last seizure recorded 2013, now driving. She had in 2013  myoclonic jerks preceding a seizure, now occassional myoclonic jerks.  Elbow and right knee. (?)  1. Absence epilepsy, primary generalized epilepsy begun as a toddler.    Patient will continue on Vimpat, Topamax and clonazepam. Patient advised that she has any seizure she should let us know. Patient is having stiffness in the joints. I will check blood work today. If blood work is unremarkable she will need to follow-up with her primary care provider in regards to the stiffness. Patient verbalized understanding. She will follow-up in 6 months with Dr. West Carboohmeier     Eugenie Harewood, MD   11/05/2015, 8:52 AM [facility] [address], [address] [place], [place] [address] [phone]

## 2015-11-05 NOTE — Patient Instructions (Signed)
Epilepsy °People with epilepsy have times when they shake and jerk uncontrollably (seizures). This happens when there is a sudden change in brain function. Epilepsy may have many possible causes. Anything that disturbs the normal pattern of brain cell activity can lead to seizures. °HOME CARE  °· Follow your doctor's instructions about driving and safety during normal activities. °· Get enough sleep. °· Only take medicine as told by your doctor. °· Avoid things that you know can cause you to have seizures (triggers). °· Write down when your seizures happen and what you remember about each seizure. Write down anything you think may have caused the seizure to happen. °· Tell the people you live and work with that you have seizures. Make sure they know how to help you. They should: °¨ Cushion your head and body. °¨ Turn you on your side. °¨ Not restrain you. °¨ Not place anything inside your mouth. °¨ Call for local emergency medical help if there is any question about what has happened. °· Keep all follow-up visits with your doctor. This is very important. °GET HELP IF: °· You get an infection or start to feel sick. You may have more seizures when you are sick. °· You are having seizures more often. °· Your seizure pattern is changing. °GET HELP RIGHT AWAY IF:  °· A seizure does not stop after a few seconds or minutes. °· A seizure causes you to have trouble breathing. °· A seizure gives you a very bad headache. °· A seizure makes you unable to speak or use a part of your body. °  °This information is not intended to replace advice given to you by your health care provider. Make sure you discuss any questions you have with your health care provider. °  °Document Released: 02/27/2009 Document Revised: 02/20/2013 Document Reviewed: 12/12/2012 °Elsevier Interactive Patient Education ©2016 Elsevier Inc. ° °

## 2015-11-10 ENCOUNTER — Other Ambulatory Visit: Payer: Self-pay | Admitting: Neurology

## 2015-11-11 ENCOUNTER — Telehealth: Payer: Self-pay

## 2015-11-11 NOTE — Telephone Encounter (Signed)
I checked Jennifer Luna's labs-she has not had them drawn. I called Jennifer Luna. She says that Dr. Vickey Hugerohmeier did not tell her that she needed to have her blood drawn. I advised her to please come by the office during business hours to have her blood drawn. I gave clinic hours. Jennifer Luna verbalized understanding.

## 2015-11-12 ENCOUNTER — Other Ambulatory Visit (INDEPENDENT_AMBULATORY_CARE_PROVIDER_SITE_OTHER): Payer: Self-pay

## 2015-11-12 DIAGNOSIS — G40A09 Absence epileptic syndrome, not intractable, without status epilepticus: Secondary | ICD-10-CM | POA: Diagnosis not present

## 2015-11-12 DIAGNOSIS — R5601 Complex febrile convulsions: Secondary | ICD-10-CM | POA: Diagnosis not present

## 2015-11-12 DIAGNOSIS — Z0289 Encounter for other administrative examinations: Secondary | ICD-10-CM

## 2015-11-12 DIAGNOSIS — G40309 Generalized idiopathic epilepsy and epileptic syndromes, not intractable, without status epilepticus: Secondary | ICD-10-CM | POA: Diagnosis not present

## 2015-11-13 LAB — COMPREHENSIVE METABOLIC PANEL
ALK PHOS: 49 IU/L (ref 39–117)
ALT: 51 IU/L — AB (ref 0–32)
AST: 29 IU/L (ref 0–40)
Albumin/Globulin Ratio: 2.1 (ref 1.2–2.2)
Albumin: 4.8 g/dL (ref 3.5–5.5)
BILIRUBIN TOTAL: 0.5 mg/dL (ref 0.0–1.2)
BUN/Creatinine Ratio: 19 (ref 9–23)
BUN: 14 mg/dL (ref 6–20)
CHLORIDE: 103 mmol/L (ref 96–106)
CO2: 17 mmol/L — ABNORMAL LOW (ref 18–29)
Calcium: 9.5 mg/dL (ref 8.7–10.2)
Creatinine, Ser: 0.75 mg/dL (ref 0.57–1.00)
GFR calc Af Amer: 129 mL/min/{1.73_m2} (ref >59)
GFR calc non Af Amer: 112 mL/min/{1.73_m2} (ref >59)
GLUCOSE: 90 mg/dL (ref 65–99)
Globulin, Total: 2.3 g/dL (ref 1.5–4.5)
Potassium: 4 mmol/L (ref 3.5–5.2)
Sodium: 141 mmol/L (ref 134–144)
TOTAL PROTEIN: 7.1 g/dL (ref 6.0–8.5)

## 2016-01-05 DIAGNOSIS — Z23 Encounter for immunization: Secondary | ICD-10-CM | POA: Diagnosis not present

## 2016-01-06 ENCOUNTER — Ambulatory Visit (INDEPENDENT_AMBULATORY_CARE_PROVIDER_SITE_OTHER): Payer: BLUE CROSS/BLUE SHIELD | Admitting: Physician Assistant

## 2016-01-06 VITALS — BP 122/70 | HR 91 | Temp 99.2°F | Resp 16 | Ht 64.0 in | Wt 143.8 lb

## 2016-01-06 DIAGNOSIS — R21 Rash and other nonspecific skin eruption: Secondary | ICD-10-CM | POA: Diagnosis not present

## 2016-01-06 MED ORDER — CLOTRIMAZOLE-BETAMETHASONE 1-0.05 % EX CREA: 1.0000 "application " | TOPICAL_CREAM | Freq: Two times a day (BID) | CUTANEOUS | 0 refills | Status: DC

## 2016-01-06 NOTE — Progress Notes (Signed)
Patient ID: Jennifer Luna, female   DOB: May 03, 1991, 25 y.o.   MRN: [id number] [facility] [address], [place] [address] [phone]  Date:  01/06/2016   Name:  Jennifer Luna   DOB:  August 14, 1990   MRN:  [id number]  PCP:  Tonye Pearson, MD   By signing my name below, I, Charline Bills, attest that this documentation has been prepared under the direction and in the presence of Trena Platt, PA-C Electronically Signed: Charline Bills, ED Scribe 01/06/2016 at 8:55 AM.  History of Present Illness:  Jennifer Luna is a 25 y.o. female patient who presents to [facility] complaining of a non-painful area of discoloration to the left thigh first noticed 1 month ago. Pt states that the area started as pruritic and red but itching has resolved and the area now appears green. She does not recall bumping into anything. Pt reports a similar area on her right neck a few weeks ago that self-resolved within 2 weeks. Pt also reports non-painful, non-pruritic discoloration to her left cheek that she first noticed a few weeks ago as well. Pt denies scaling from the area on her cheek but has noticed flaking from her thigh. She denies seeing an insect bite her. She has tried applying lotion and alcohol to the areas last week without significant relief. Pt denies dizziness, easy bruising, gingival bleeding, fever, body aches, arthralgias, visual disturbances, neck pain, sore throat, bladder incontinence. No family h/o bleeding disorders or thyroid disease. Pt's last seizure was last year.   Patient Active Problem List   Diagnosis Date Noted   Complex febrile convulsion (HCC) 11/05/2015   Epilepsy, generalized, convulsive (HCC) 11/05/2015   Absence epileptic syndrome, not intractable, w/o status epilepticus (HCC) 04/28/2014   Arthralgia 04/23/2013   Shortness of breath 04/23/2013   Intractable absence epilepsy (HCC) 09/21/2012   Pain in joint, shoulder region  09/21/2012    Past Medical History:  Diagnosis Date   Amenorrhea 01/11/2012   notes from office visit, PERIMENTRUAL PAINS   Arthralgia 04/23/2013   Epilepsy (HCC)    with absence seizures, abnormal EEG   Seizures (HCC)    nx of absent and grand mal   Shortness of breath 04/23/2013   Sleepiness 01/11/2012   notes from office visit   Sleepiness    DAYTIME   Thrombocytopenia (HCC) 01/11/12   office notes from last visit, with zarontin and depakote    Past Surgical History:  Procedure Laterality Date   shoulder injury      Social History  Substance Use Topics   Smoking status: Never Smoker   Smokeless tobacco: Never Used   Alcohol use No    Family History  Problem Relation Age of Onset   Hypothyroidism Mother    Arthritis Mother    Hypertension Father    Diabetes Father    Hyperlipidemia Father    Breast cancer Maternal Grandmother    Heart disease Maternal Grandmother    Hypertension Maternal Grandmother    Heart disease Maternal Grandfather    Heart disease Paternal Grandmother    Rheum arthritis Paternal Grandmother    Lupus Maternal Aunt    Seizures Paternal Uncle     as a child   Asthma Sister    Rheum arthritis Maternal Aunt    Asthma      Maternal Great Uncle    Asthma Sister     had as a child    Allergies  Allergen Reactions  Penicillins Rash    Has patient had a PCN reaction causing immediate rash, facial/tongue/throat swelling, SOB or lightheadedness with hypotension: No Has patient had a PCN reaction causing severe rash involving mucus membranes or skin necrosis: Yes Has patient had a PCN reaction that required hospitalization No Has patient had a PCN reaction occurring within the last 10 years: No If all of the above answers are "NO", then may proceed with Cephalosporin use.    Sulfa Antibiotics Rash   Erythromycin Rash    Medication list has been reviewed and updated.  Current Outpatient Prescriptions on  File Prior to Visit  Medication Sig Dispense Refill   Ascorbic Acid (VITAMIN C PO) Take 1 tablet by mouth daily.     CAMRESE 0.15-0.03 &0.01 MG tablet Take 1 tablet by mouth daily.  3   clonazePAM (KLONOPIN) 0.5 MG tablet Take 0.5 tablets (0.25 mg total) by mouth at bedtime. 45 tablet 1   Cyanocobalamin (VITAMIN B-12 PO) Take 1 tablet by mouth daily.     ibuprofen (ADVIL,MOTRIN) 600 MG tablet Take 1 tablet (600 mg total) by mouth every 6 (six) hours as needed. (Patient taking differently: Take 600 mg by mouth every 6 (six) hours as needed for moderate pain. ) 30 tablet 0   lacosamide (VIMPAT) 50 MG TABS tablet Take 1-2 tablets (50-100 mg total) by mouth 2 (two) times daily. Take 50 mg in the morning and 100 mg in the evening. 70 tablet 0   simethicone (INFANTS SIMETHICONE) 40 MG/0.6ML drops Take 0.6 mLs (40 mg total) by mouth 4 (four) times daily as needed. (Patient taking differently: Take 40 mg by mouth 4 (four) times daily as needed for flatulence. ) 30 mL 0   topiramate (TOPAMAX) 100 MG tablet Take 0.5-1 tablets (50-100 mg total) by mouth 2 (two) times daily. 50 mg in AM and 100 mg in PM po. 135 tablet 3   Topiramate ER 150 MG CS24 Take 150 mg by mouth 1 day or 1 dose. 90 each 3   zonisamide (ZONEGRAN) 25 MG capsule Take 2 capsules (50 mg total) by mouth at bedtime. 360 capsule 3   No current facility-administered medications on file prior to visit.     Review of Systems  Constitutional: Negative for fever.  HENT: Negative for sore throat.   Eyes: Negative for blurred vision and double vision.  Musculoskeletal: Negative for joint pain, myalgias and neck pain.  Skin:       +bruise   Neurological: Negative for dizziness.  Endo/Heme/Allergies: Does not bruise/bleed easily.    Physical Examination: BP 122/70 (BP Location: Right Arm, Patient Position: Sitting, Cuff Size: Normal)    Pulse 91    Temp 99.2 F (37.3 C) (Oral)    Resp 16    Ht 5\' 4"  (1.626 m)    Wt 143 lb 12.8 oz  (65.2 kg)    LMP 10/29/2015 (Approximate)    SpO2 99%    BMI 24.68 kg/m  Ideal Body Weight: @FLOWAMB ([id number])@([phone])@  Physical Exam  Constitutional: She is oriented to person, place, and time. She appears well-developed and well-nourished. No distress.  HENT:  Head: Normocephalic and atraumatic.  Right Ear: External ear normal.  Left Ear: External ear normal.  Eyes: Conjunctivae and EOM are normal. Pupils are equal, round, and reactive to light.  Cardiovascular: Normal rate.   Pulmonary/Chest: Effort normal. No respiratory distress.  Neurological: She is alert and oriented to person, place, and time.  Skin: She is not diaphoretic.  L medial thigh  with 1 cm erythematous non-blanching circular lesion without central sparing  3 cm distal to that is a similar lesion with mildly hyperpigmented skin with very minimal scaling slightly shiny   Psychiatric: She has a normal mood and affect. Her behavior is normal.   Assessment and Plan: Delsa BernChelsea L Cape is a 25 y.o. female who is here today for chief complaint of rash. I will attempt to get a skin KOH, however there was not enough dry skin. We will treat her to alleviate as a dermatitis as well as a fungal etiology. We are giving her Lotrisone twice per day. She is advised to take it only for 2 weeks. She will return to clinic if her symptoms do not improve within the next 10 days.  Rash and nonspecific skin eruption - Plan: clotrimazole-betamethasone (LOTRISONE) cream, Care order/instruction:   Trena PlattStephanie English, PA-C [facility] 01/06/2016 8:23 AM

## 2016-01-06 NOTE — Patient Instructions (Addendum)
Please apply topical cream as prescribed, no longer than 2 weeks--as this thins the skin. If there is no improvement, please return.   Rash A rash is a change in the color or feel of your skin. There are many different types of rashes. You may have other problems along with your rash. HOME CARE  Avoid the thing that caused your rash.  Do not scratch your rash.  You may take cools baths to help stop itching.  Only take medicines as told by your doctor.  Keep all doctor visits as told. GET HELP RIGHT AWAY IF:   Your pain, puffiness (swelling), or redness gets worse.  You have a fever.  You have new or severe problems.  You have body aches, watery poop (diarrhea), or you throw up (vomit).  Your rash is not better after 3 days. MAKE SURE YOU:   Understand these instructions.  Will watch your condition.  Will get help right away if you are not doing well or get worse.   This information is not intended to replace advice given to you by your health care provider. Make sure you discuss any questions you have with your health care provider.   Document Released: 10/19/2007 Document Revised: 07/25/2011 Document Reviewed: 09/17/2014 Elsevier Interactive Patient Education 2016 [employer]     IF you received an x-ray today, you will receive an invoice from [facility]. Please contact [facility] at [phone] with questions or concerns regarding your invoice.   IF you received labwork today, you will receive an invoice from [facility]. Please contact [facility] at [phone] with questions or concerns regarding your invoice.   Our billing staff will not be able to assist you with questions regarding bills from these companies.  You will be contacted with the lab results as soon as they are available. The fastest way to get your results is to activate your My Chart account. Instructions are located on the last page of this  paperwork. If you have not heard from us regarding the results in 2 weeks, please contact this office.

## 2016-01-09 ENCOUNTER — Encounter: Payer: Self-pay | Admitting: Physician Assistant

## 2016-01-21 ENCOUNTER — Telehealth: Payer: Self-pay | Admitting: Neurology

## 2016-01-21 MED ORDER — LACOSAMIDE 50 MG PO TABS: 50.0000 mg | ORAL_TABLET | Freq: Two times a day (BID) | ORAL | 5 refills | Status: DC

## 2016-01-21 NOTE — Telephone Encounter (Signed)
Rx printed, awaiting signature. 

## 2016-01-21 NOTE — Telephone Encounter (Signed)
Patient called back, spoke w/Shirley earlier regarding whether or not VIMPAT refill was received from [facility], patient was advised, refill not received. Patient states she called [facility] and was advised they faxed request to our office 2 days ago. Patient requests refill of lacosamide (VIMPAT) 50 MG TABS tablet [facility] [phone] or [phone].

## 2016-01-26 NOTE — Telephone Encounter (Signed)
It [facility]. [facility] today and received a receipt of confirmation. I called pt's mother and advised her of this information. Pt's mother verbalized understanding.

## 2016-01-26 NOTE — Telephone Encounter (Signed)
Patient's mother is calling regarding Rx lacosamide (VIMPAT) 50 MG TABS tablet that has [facility]. Please fax to [phone] and please call the patient's mother and advise.

## 2016-04-28 ENCOUNTER — Encounter: Payer: Self-pay | Admitting: Adult Health

## 2016-04-28 ENCOUNTER — Ambulatory Visit (INDEPENDENT_AMBULATORY_CARE_PROVIDER_SITE_OTHER): Payer: BLUE CROSS/BLUE SHIELD | Admitting: Adult Health

## 2016-04-28 VITALS — BP 106/58 | HR 90 | Resp 16 | Ht 64.0 in | Wt 142.0 lb

## 2016-04-28 DIAGNOSIS — R569 Unspecified convulsions: Secondary | ICD-10-CM

## 2016-04-28 MED ORDER — ZONISAMIDE 25 MG PO CAPS: 50.0000 mg | ORAL_CAPSULE | Freq: Every day | ORAL | 3 refills | Status: DC

## 2016-04-28 MED ORDER — LACOSAMIDE 50 MG PO TABS: ORAL_TABLET | ORAL | 5 refills | Status: DC

## 2016-04-28 MED ORDER — CLONAZEPAM 0.5 MG PO TABS: 0.2500 mg | ORAL_TABLET | Freq: Every day | ORAL | 5 refills | Status: DC

## 2016-04-28 MED ORDER — TOPIRAMATE 100 MG PO TABS: ORAL_TABLET | ORAL | 3 refills | Status: DC

## 2016-04-28 NOTE — Progress Notes (Signed)
PATIENT: Jennifer Luna DOB: 1991/04/06  REASON FOR VISIT: follow up- seizures HISTORY FROM: patient  HISTORY OF PRESENT ILLNESS: Jennifer Luna is a 25 year old female with a history of seizures. She returns today for follow-up. She is currently taken Vimpat, Topamax, Zonegran and clonazepam. She reports that she is not had any seizures. She states that she is tolerating the medications well. She is operating a motor vehicle without difficulty. Able to complete all ADLs independently. She has noticed some increase in appetite and weight gain she is not sure if this is related to her medication. Denies any new neurological symptoms. He returns today for an evaluation.  HISTORY 05/07/15 (MM): Jennifer Luna is a 25 year old female with a history of seizures. She returns today for follow-up. She continues to take Vimpat, Topamax and clonazepam. She reports that she is not had any seizuressince the last visit. She is able to operate a motor vehicle without difficulty. She is able to complete all ADLs independently. She states that her seizures typically consist of staring spells that last for several seconds. She denies any changes in her mood or behavior. She does state that she's recently been very tired. She states that she works 4 hours a day and is also in [employer]. She states that lately she has been very busy. She denies any changes with her walking or balance. However her mother speaks up and states that she is very stiff for a 25 year old. She states that when she is gets out of bed in the morning she does feel stiff in her joints specifically the hips and knees. She states that when she gets going this improves. The mother states that she has fibromyalgia and is wondering if some of the symptoms that her daughter has maybe related to fibromyalgia as well. She returns today for an evaluation.  HISTORY 10/29/14: Jennifer Luna is a 25 y.o. female Is seen here as a revisit from Dr. Cleta Alberts.  She has been one year seizure free and driving. She still has occasional little absence "blurps" but she has not had the loss of conversational flow and the secondary generalization. She had onset of seizures at age 35 . Her mother described the toddler coming up to her and sudden eye flutter and staring off, unresponsive.   Her last seizure in 2013 , April.  Driving to work, has 2 part time jobs. She has developed well, has become much more independent and can drive.   She's taking 2 Vimpat 100 mg by mouth twice a day, she's taking Topiramate 100 mg bid po.  She's taking Klonopin or 0.5 mg a half tablet by mouth at bedtime.  She has been informed about a VNS treatment option, she doesn't need now as she doing so well.   REVIEW OF SYSTEMS: Out of a complete 14 system review of symptoms, the patient complains only of the following symptoms, and all other reviewed systems are negative.  Appetite change, unexpected weight change, eye discharge, headache, seizure, frequent waking  ALLERGIES: Allergies  Allergen Reactions  . Penicillins Rash    halosporin use.   . Sulfa Antibiotics Rash  . Erythromycin Rash    HOME MEDICATIONS: Outpatient Medications Prior to Visit  Medication Sig Dispense Refill  . Ascorbic Acid (VITAMIN C PO) Take 1 tablet by mouth daily.    Marland Kitchen CAMRESE 0.15-0.03 &0.01 MG tablet Take 1 tablet by mouth daily.  3  . clonazePAM (KLONOPIN) 0.5 MG tablet Take 0.5 tablets (0.25 mg total)  by mouth at bedtime. 45 tablet 1  . Cyanocobalamin (VITAMIN B-12 PO) Take 1 tablet by mouth daily.    Marland Kitchen. ibuprofen (ADVIL,MOTRIN) 600 MG tablet Take 1 tablet (600 mg total) by mouth every 6 (six) hours as needed. (Patient taking differently: Take 600 mg by mouth every 6 (six) hours as needed for moderate pain. ) 30 tablet 0  . lacosamide (VIMPAT) 50 MG TABS tablet Take 1-2 tablets (50-100 mg total) by mouth 2 (two) times daily. 90 tablet 5  . simethicone (INFANTS SIMETHICONE) 40 MG/0.6ML  drops Take 0.6 mLs (40 mg total) by mouth 4 (four) times daily as needed. (Patient taking differently: Take 40 mg by mouth 4 (four) times daily as needed for flatulence. ) 30 mL 0  . topiramate (TOPAMAX) 100 MG tablet Take 0.5-1 tablets (50-100 mg total) by mouth 2 (two) times daily. 50 mg in AM and 100 mg in PM po. 135 tablet 3  . Topiramate ER 150 MG CS24 Take 150 mg by mouth 1 day or 1 dose. 90 each 3  . zonisamide (ZONEGRAN) 25 MG capsule Take 2 capsules (50 mg total) by mouth at bedtime. 360 capsule 3  . clotrimazole-betamethasone (LOTRISONE) cream Apply 1 application topically 2 (two) times daily. 15 g 0   No facility-administered medications prior to visit.     PAST MEDICAL HISTORY: Past Medical History:  Diagnosis Date  . Amenorrhea 01/11/2012   notes from office visit, PERIMENTRUAL PAINS  . Arthralgia 04/23/2013  . Epilepsy (HCC)    with absence seizures, abnormal EEG  . Seizures (HCC)    nx of absent and grand mal  . Shortness of breath 04/23/2013  . Sleepiness 01/11/2012   notes from office visit  . Sleepiness    DAYTIME  . Thrombocytopenia (HCC) 01/11/12   office notes from last visit, with zarontin and depakote    PAST SURGICAL HISTORY: Past Surgical History:  Procedure Laterality Date  . shoulder injury      FAMILY HISTORY: Family History  Problem Relation Age of Onset  . Hypothyroidism Mother   . Arthritis Mother   . Hypertension Father   . Diabetes Father   . Hyperlipidemia Father   . Breast cancer Maternal Grandmother   . Heart disease Maternal Grandmother   . Hypertension Maternal Grandmother   . Heart disease Maternal Grandfather   . Heart disease Paternal Grandmother   . Rheum arthritis Paternal Grandmother   . Lupus Maternal Aunt   . Seizures Paternal Uncle     as a child  . Asthma Sister   . Rheum arthritis Maternal Aunt   . Asthma      Maternal [employer] Uncle   . Asthma Sister     had as a child    SOCIAL HISTORY: Social History   Social  History  . Marital status: Single    Spouse name: N/A  . Number of children: 0  . Years of education: 12   Occupational History  . 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 STUDENT Unemployed   Social History Main Topics  . Smoking status: Never Smoker  . Smokeless tobacco: Never Used  . Alcohol use No  . Drug use: No  . Sexual activity: Not on file   Other Topics Concern  . Not on file   Social History Narrative   Patient is single and lives at home with her parents.   Patient is working part-time.   Patient has a high school education.   Patient is right-handed.  Patient drinks two cups of coffee.      PHYSICAL EXAM  Vitals:   04/28/16 0909  BP: (!) 106/58  Pulse: 90  Resp: 16  Weight: 142 lb (64.4 kg)  Height: 5\' 4"  (1.626 m)   Body mass index is 24.37 kg/m.  Generalized: Well developed, in no acute distress   Neurological examination  Mentation: Alert oriented to time, place, history taking. Follows all commands speech and language fluent Cranial nerve II-XII: Pupils were equal round reactive to light. Extraocular movements were full, visual field were full on confrontational test. Facial sensation and strength were normal. Uvula tongue midline. Head turning and shoulder shrug  were normal and symmetric. Motor: The motor testing reveals 5 over 5 strength of all 4 extremities. Good symmetric motor tone is noted throughout.  Sensory: Sensory testing is intact to soft touch on all 4 extremities. No evidence of extinction is noted.  Coordination: Cerebellar testing reveals good finger-nose-finger and heel-to-shin bilaterally.  Gait and station: Gait is normal. Tandem gait is normal. Romberg is negative. No drift is seen.  Reflexes: Deep tendon reflexes are symmetric and normal bilaterally.   DIAGNOSTIC DATA (LABS, IMAGING, TESTING) - I reviewed patient records, labs, notes, testing and imaging myself where available.  Lab Results  Component Value Date   WBC 8.2  05/14/2015   HGB 17.0 (H) 05/14/2015   HCT 50.0 (H) 05/14/2015   MCV 90.9 05/14/2015   PLT 264 05/14/2015      Component Value Date/Time   NA 141 11/12/2015 0954   K 4.0 11/12/2015 0954   CL 103 11/12/2015 0954   CO2 17 (L) 11/12/2015 0954   GLUCOSE 90 11/12/2015 0954   GLUCOSE 112 (H) 05/14/2015 1053   BUN 14 11/12/2015 0954   CREATININE 0.75 11/12/2015 0954   CREATININE 0.74 04/24/2013 1605   CALCIUM 9.5 11/12/2015 0954   PROT 7.1 11/12/2015 0954   ALBUMIN 4.8 11/12/2015 0954   AST 29 11/12/2015 0954   ALT 51 (H) 11/12/2015 0954   ALKPHOS 49 11/12/2015 0954   BILITOT 0.5 11/12/2015 0954   GFRNONAA 112 11/12/2015 0954   GFRAA 129 11/12/2015 0954    Lab Results  Component Value Date   VITAMINB12 911 04/24/2013   Lab Results  Component Value Date   TSH 0.272 (L) 04/15/2013      ASSESSMENT AND PLAN 25 y.o. year old female  has a past medical history of Amenorrhea (01/11/2012); Arthralgia (04/23/2013); Epilepsy (HCC); Seizures (HCC); Shortness of breath (04/23/2013); Sleepiness (01/11/2012); Sleepiness; and Thrombocytopenia (HCC) (01/11/12). here with:  1. Seizures  Overall the patient is doing well. She will continue on Vimpat, Topamax, Zonegran and clonazepam. Prescriptions were refilled today. Advised patient that if she has any seizure event she should let us know. She will follow-up in 6 months with Dr. Vergia Alconohmeier     Kentrell Guettler, MSN, NP-C 04/28/2016, 9:16 AM [facility] [address], [address] [place], [place] [address] [phone]

## 2016-04-28 NOTE — Patient Instructions (Addendum)
Continue Vimpat, Topamax, zonegran and Klonopin  If your symptoms worsen or you develop new symptoms please let us know.

## 2016-04-29 NOTE — Progress Notes (Signed)
I agree with the assessment and plan as directed by NP .The patient is known to me .   Layliana Devins, MD  

## 2016-05-02 DIAGNOSIS — Z01419 Encounter for gynecological examination (general) (routine) without abnormal findings: Secondary | ICD-10-CM | POA: Diagnosis not present

## 2016-05-02 DIAGNOSIS — Z6824 Body mass index (BMI) 24.0-24.9, adult: Secondary | ICD-10-CM | POA: Diagnosis not present

## 2016-06-08 ENCOUNTER — Other Ambulatory Visit: Payer: Self-pay

## 2016-06-08 MED ORDER — LACOSAMIDE 50 MG PO TABS: ORAL_TABLET | ORAL | 5 refills | Status: DC

## 2016-07-04 ENCOUNTER — Encounter (HOSPITAL_COMMUNITY): Payer: Self-pay

## 2016-07-04 DIAGNOSIS — Z79899 Other long term (current) drug therapy: Secondary | ICD-10-CM | POA: Insufficient documentation

## 2016-07-04 DIAGNOSIS — R109 Unspecified abdominal pain: Secondary | ICD-10-CM | POA: Diagnosis not present

## 2016-07-04 DIAGNOSIS — R1033 Periumbilical pain: Secondary | ICD-10-CM | POA: Diagnosis not present

## 2016-07-04 LAB — COMPREHENSIVE METABOLIC PANEL
ALBUMIN: 4.6 g/dL (ref 3.5–5.0)
ALK PHOS: 50 U/L (ref 38–126)
ALT: 34 U/L (ref 14–54)
AST: 24 U/L (ref 15–41)
Anion gap: 6 (ref 5–15)
BILIRUBIN TOTAL: 0.7 mg/dL (ref 0.3–1.2)
BUN: 20 mg/dL (ref 6–20)
CO2: 22 mmol/L (ref 22–32)
Calcium: 9.3 mg/dL (ref 8.9–10.3)
Chloride: 109 mmol/L (ref 101–111)
Creatinine, Ser: 0.87 mg/dL (ref 0.44–1.00)
GFR calc Af Amer: >60 mL/min (ref >60)
GFR calc non Af Amer: >60 mL/min (ref >60)
GLUCOSE: 93 mg/dL (ref 65–99)
POTASSIUM: 3.4 mmol/L — AB (ref 3.5–5.1)
Sodium: 137 mmol/L (ref 135–145)
TOTAL PROTEIN: 7.2 g/dL (ref 6.5–8.1)

## 2016-07-04 LAB — CBC
HEMATOCRIT: 39.5 % (ref 36.0–46.0)
Hemoglobin: 13.4 g/dL (ref 12.0–15.0)
MCH: 30 pg (ref 26.0–34.0)
MCHC: 33.9 g/dL (ref 30.0–36.0)
MCV: 88.4 fL (ref 78.0–100.0)
Platelets: 220 10*3/uL (ref 150–400)
RBC: 4.47 MIL/uL (ref 3.87–5.11)
RDW: 12.8 % (ref 11.5–15.5)
WBC: 7 10*3/uL (ref 4.0–10.5)

## 2016-07-04 LAB — I-STAT BETA HCG BLOOD, ED (MC, WL, AP ONLY): I-stat hCG, quantitative: <5 m[IU]/mL (ref <5)

## 2016-07-04 LAB — LIPASE, BLOOD: Lipase: 23 U/L (ref 11–51)

## 2016-07-04 NOTE — ED Triage Notes (Signed)
PT C/O A PULLING OR STRETCHING SENSATION BEHIND THE UMBILICUS SINCE THIS MORNING. PT DENIES PAIN ON PALPATION, FEVER, N/V/D.

## 2016-07-05 ENCOUNTER — Emergency Department (HOSPITAL_COMMUNITY): Payer: BLUE CROSS/BLUE SHIELD

## 2016-07-05 ENCOUNTER — Emergency Department (HOSPITAL_COMMUNITY)
Admission: EM | Admit: 2016-07-05 | Discharge: 2016-07-05 | Disposition: A | Discharge status: Home / Self Care | Payer: BLUE CROSS/BLUE SHIELD | Attending: Emergency Medicine | Admitting: Emergency Medicine

## 2016-07-05 DIAGNOSIS — R1033 Periumbilical pain: Secondary | ICD-10-CM

## 2016-07-05 DIAGNOSIS — R109 Unspecified abdominal pain: Secondary | ICD-10-CM | POA: Diagnosis not present

## 2016-07-05 LAB — URINALYSIS, ROUTINE W REFLEX MICROSCOPIC
Bilirubin Urine: NEGATIVE
Glucose, UA: NEGATIVE mg/dL
Hgb urine dipstick: NEGATIVE
Ketones, ur: 5 mg/dL — AB
Nitrite: NEGATIVE
PH: 6 (ref 5.0–8.0)
Protein, ur: NEGATIVE mg/dL
SPECIFIC GRAVITY, URINE: 1.02 (ref 1.005–1.030)

## 2016-07-05 MED ORDER — DICYCLOMINE HCL 10 MG PO CAPS
10.0000 mg | ORAL_CAPSULE | Freq: Once | ORAL | Status: AC
  Administered 2016-07-05: 10 mg via ORAL
  Filled 2016-07-05: qty 1

## 2016-07-05 MED ORDER — IBUPROFEN 600 MG PO TABS: 600.0000 mg | ORAL_TABLET | Freq: Four times a day (QID) | ORAL | 0 refills | Status: AC | PRN

## 2016-07-05 MED ORDER — GI COCKTAIL ~~LOC~~
30.0000 mL | Freq: Once | ORAL | Status: AC
  Administered 2016-07-05: 30 mL via ORAL
  Filled 2016-07-05: qty 30

## 2016-07-05 MED ORDER — DICYCLOMINE HCL 20 MG PO TABS: 20.0000 mg | ORAL_TABLET | Freq: Two times a day (BID) | ORAL | 0 refills | Status: DC | PRN

## 2016-07-05 NOTE — ED Provider Notes (Signed)
[facility]-EMERGENCY DEPT Provider Note   CSN: [id number] Arrival date & time: 07/04/16  2225  By signing my name below, I, Alyssa Grove, attest that this documentation has been prepared under the direction and in the presence of [employer], PA-C. Electronically Signed: Alyssa Grove, ED Scribe. 07/05/16. 1:55 AM.  History   Chief Complaint Chief Complaint  Patient presents with  . Abdominal Pain   The history is provided by the patient and the spouse. No language interpreter was used.   HPI Comments: Jennifer Luna is a 26 y.o. female who presents to the Emergency Department complaining of gradual onset, constant, moderate, 6/10, pressure-like sensation to the region behind the navel beginning this morning. Pain is not exacerbated with palpation or eating. She has taken Ibuprofen with no relief to pain. She had a normal bowel movement today. She denies recent strenuous activity. No hx of hernias. Pt denies recent known sick contact and abdominal PSHx. Denies nausea, vomiting, blood in stool, chest pain, urinary symptoms.  Past Medical History:  Diagnosis Date  . Amenorrhea 01/11/2012   notes from office visit, PERIMENTRUAL PAINS  . Arthralgia 04/23/2013  . Epilepsy (HCC)    with absence seizures, abnormal EEG  . Seizures (HCC)    nx of absent and grand mal  . Shortness of breath 04/23/2013  . Sleepiness 01/11/2012   notes from office visit  . Sleepiness    DAYTIME  . Thrombocytopenia (HCC) 01/11/12   office notes from last visit, with zarontin and depakote    Patient Active Problem List   Diagnosis Date Noted  . Complex febrile convulsion (HCC) 11/05/2015  . Epilepsy, generalized, convulsive (HCC) 11/05/2015  . Absence epileptic syndrome, not intractable, w/o status epilepticus (HCC) 04/28/2014  . Arthralgia 04/23/2013  . Shortness of breath 04/23/2013  . Intractable absence epilepsy (HCC) 09/21/2012  . Pain in joint, shoulder region 09/21/2012    Past Surgical History:    Procedure Laterality Date  . shoulder injury      OB History    No data available       Home Medications    Prior to Admission medications   Medication Sig Start Date End Date Taking? Authorizing Provider  Ascorbic Acid (VITAMIN C PO) Take 1 tablet by mouth daily.   Yes Historical Provider, MD  CAMRESE 0.15-0.03 &0.01 MG tablet Take 1 tablet by mouth daily. 01/31/14  Yes Historical Provider, MD  clonazePAM (KLONOPIN) 0.5 MG tablet Take 0.5 tablets (0.25 mg total) by mouth at bedtime. 04/28/16  Yes Butch Penny, NP  Cyanocobalamin (VITAMIN B-12 PO) Take 1 tablet by mouth daily.   Yes Historical Provider, MD  folic acid (FOLVITE) 1 MG tablet Take 1 mg by mouth daily.   Yes Historical Provider, MD  lacosamide (VIMPAT) 50 MG TABS tablet Take 1 tablet in the AM and 2 tablets at bedtime Patient taking differently: Take 50-100 mg by mouth 2 (two) times daily. Take 1 tablet in the AM and 2 tablets at bedtime 06/08/16  Yes Melvyn Novas, MD  simethicone (INFANTS SIMETHICONE) 40 MG/0.6ML drops Take 0.6 mLs (40 mg total) by mouth 4 (four) times daily as needed. Patient taking differently: Take 40 mg by mouth 4 (four) times daily as needed for flatulence.  02/27/13  Yes Carmen Dohmeier, MD  topiramate (TOPAMAX) 100 MG tablet 50 mg in AM and 100 mg in PM po. Patient taking differently: Take 50-100 mg by mouth 2 (two) times daily. 50 mg in AM and 100 mg in PM po.  04/28/16  Yes Butch PennyMegan Millikan, NP  dicyclomine (BENTYL) 20 MG tablet Take 1 tablet (20 mg total) by mouth 2 (two) times daily as needed (abdominal cramping). 07/05/16   Antony MaduraKelly Chace Klippel, PA-C  ibuprofen (ADVIL,MOTRIN) 600 MG tablet Take 1 tablet (600 mg total) by mouth every 6 (six) hours as needed. 07/05/16   Antony MaduraKelly Carmelle Bamberg, PA-C  zonisamide (ZONEGRAN) 25 MG capsule Take 2 capsules (50 mg total) by mouth at bedtime. Patient not taking: Reported on 07/05/2016 04/28/16   Butch PennyMegan Millikan, NP    Family History Family History  Problem Relation Age of  Onset  . Hypothyroidism Mother   . Arthritis Mother   . Hypertension Father   . Diabetes Father   . Hyperlipidemia Father   . Breast cancer Maternal Grandmother   . Heart disease Maternal Grandmother   . Hypertension Maternal Grandmother   . Heart disease Maternal Grandfather   . Heart disease Paternal Grandmother   . Rheum arthritis Paternal Grandmother   . Asthma Sister   . Lupus Maternal Aunt   . Seizures Paternal Uncle     as a child  . Rheum arthritis Maternal Aunt   . Asthma      Maternal [employer] Uncle   . Asthma Sister     had as a child    Social History Social History  Substance Use Topics  . Smoking status: Never Smoker  . Smokeless tobacco: Never Used  . Alcohol use No     Allergies   Penicillins; Sulfa antibiotics; and Erythromycin   Review of Systems Review of Systems A complete 10 system review of systems was obtained and all systems are negative except as noted in the HPI and PMH.     Physical Exam Updated Vital Signs BP 116/87   Pulse 71   Temp 98.3 F (36.8 C) (Oral)   Resp 16   Ht 5\' 5"  (1.651 m)   Wt 65 kg   SpO2 100%   BMI 23.83 kg/m   Physical Exam  Constitutional: She is oriented to person, place, and time. She appears well-developed and well-nourished. No distress.  Nontoxic and in no acute distress. Pleasant.  HENT:  Head: Normocephalic and atraumatic.  Eyes: Conjunctivae and EOM are normal. No scleral icterus.  Neck: Normal range of motion.  Cardiovascular: Normal rate, regular rhythm and intact distal pulses.   Pulmonary/Chest: Effort normal. No respiratory distress. She has no wheezes. She has no rales.  Respirations even and unlabored. Lungs clear to auscultation bilaterally.  Abdominal: Soft. She exhibits no distension and no mass. There is tenderness. There is no guarding.  Minimal periumbilical tenderness. No masses or guarding. Abdomen soft, nondistended. Bowel sounds normoactive.  Musculoskeletal: Normal range of motion.   Neurological: She is alert and oriented to person, place, and time. She exhibits normal muscle tone. Coordination normal.  GCS 15. Patient moving all extremities.  Skin: Skin is warm and dry. No rash noted. She is not diaphoretic. No erythema. No pallor.  Psychiatric: She has a normal mood and affect. Her behavior is normal.  Nursing note and vitals reviewed.    ED Treatments / Results  DIAGNOSTIC STUDIES: Oxygen Saturation is 97% on RA, normal by my interpretation.    COORDINATION OF CARE: 1:47 AM Discussed treatment plan with pt at bedside which includes Abdominal US, Bentyl, and GI Cocktail and pt agreed to plan.  4:00 AM Repeat abdominal exam is stable. No guarding, rigidity, or masses. Abdomen soft. Patient in no visible or audible signs of  discomfort.  Labs (all labs ordered are listed, but only abnormal results are displayed) Labs Reviewed  COMPREHENSIVE METABOLIC PANEL - Abnormal; Notable for the following:       Result Value   Potassium 3.4 (*)    All other components within normal limits  URINALYSIS, ROUTINE W REFLEX MICROSCOPIC - Abnormal; Notable for the following:    APPearance HAZY (*)    Ketones, ur 5 (*)    Leukocytes, UA MODERATE (*)    Bacteria, UA RARE (*)    Squamous Epithelial / LPF 0-5 (*)    All other components within normal limits  LIPASE, BLOOD  CBC  I-STAT BETA HCG BLOOD, ED ([facility], [facility], [facility] ONLY)    EKG  EKG Interpretation None       Radiology US Abdomen Complete  Result Date: 07/05/2016 CLINICAL DATA:  26 year old female with abdominal pain. EXAM: ABDOMEN ULTRASOUND COMPLETE COMPARISON:  None. FINDINGS: Gallbladder: No gallstones or wall thickening visualized. No sonographic Murphy sign noted by sonographer. Common bile duct: Diameter: 2 mm Liver: No focal lesion identified. Within normal limits in parenchymal echogenicity. IVC: No abnormality visualized. Pancreas: Visualized portion unremarkable. Spleen: Size and appearance within normal  limits. Right Kidney: Length: 11.2 cm. Echogenicity within normal limits. No mass or hydronephrosis visualized. Left Kidney: Length: 11.6 cm. Echogenicity within normal limits. No mass or hydronephrosis visualized. Abdominal aorta: No aneurysm visualized. Other findings: Ultrasound of the periumbilical region in the area of pain was performed. The abdominal wall in this area appears unremarkable. The deeper structures are obscured by bowel gas. IMPRESSION: Unremarkable abdominal ultrasound. Electronically Signed   By: Elgie Collard M.D.   On: 07/05/2016 03:28    Procedures Procedures (including critical care time)  Medications Ordered in ED Medications  dicyclomine (BENTYL) capsule 10 mg (10 mg Oral Given 07/05/16 0306)  gi cocktail (Maalox,Lidocaine,Donnatal) (30 mLs Oral Given 07/05/16 0307)     Initial Impression / Assessment and Plan / ED Course  I have reviewed the triage vital signs and the nursing notes.  Pertinent labs & imaging results that were available during my care of the patient were reviewed by me and considered in my medical decision making (see chart for details).     26 year old female comes to the emergency department for evaluation of nonspecific periumbilical abdominal pain. Symptoms began today and had not been associated with vomiting or bowel changes. Patient reports a normal bowel movement earlier today. She has a reassuring abdominal exam. Laboratory workup is noncontributory. Patient is without fever. An abdominal ultrasound was performed which is also reassuring.  Repeat abdominal exam is stable. I have a low suspicion for emergent or infectious etiology as cause of this patient's abdominal pain. Plan to manage supportively. She has been instructed to see her primary care doctor on an outpatient basis for follow-up and repeat abdominal exam. Return precautions discussed and provided. Patient discharged in stable condition with no unaddressed concerns.   Final  Clinical Impressions(s) / ED Diagnoses   Final diagnoses:  Periumbilical abdominal pain    New Prescriptions New Prescriptions   DICYCLOMINE (BENTYL) 20 MG TABLET    Take 1 tablet (20 mg total) by mouth 2 (two) times daily as needed (abdominal cramping).   IBUPROFEN (ADVIL,MOTRIN) 600 MG TABLET    Take 1 tablet (600 mg total) by mouth every 6 (six) hours as needed.    I personally performed the services described in this documentation, which was scribed in my presence. The recorded information has been reviewed and  is accurate.      Antony Madura, PA-C 07/05/16 1610    Gilda Crease, MD 07/05/16 [phone]

## 2016-07-05 NOTE — Discharge Instructions (Signed)
Your workup today was reassuring. We recommend use of ibuprofen and Bentyl for abdominal pain. Follow-up with your primary care doctor within the next 24 hours for a repeat abdominal exam. If your pain significantly worsens or if you develop a fever, vomiting, or any of the symptoms listed below, return to the emergency department for further evaluation.

## 2016-07-21 ENCOUNTER — Other Ambulatory Visit: Payer: Self-pay | Admitting: Physician Assistant

## 2016-07-21 NOTE — Telephone Encounter (Signed)
Last seen 12/2015

## 2016-09-06 ENCOUNTER — Telehealth: Payer: Self-pay | Admitting: *Deleted

## 2016-09-06 NOTE — Telephone Encounter (Signed)
Spoke with [facility] and advised her the only insurance information this RN has is for [employer] to do PA for Vimpat.  Cherly Anderson stated she can fax over form for Cover My Meds to complete PA online with Medicaid. She stated medication will only be $3 with Medicaid, will be $80 with [employer].  Therisa Doyne fax number for RN pod.

## 2016-09-07 NOTE — Telephone Encounter (Signed)
Called [facility] to initiate PA for Vimpat, spoke with Dalia. Call has Ref #[id number]. She connected this RN to pharmacy. Spoke with Rosanne Ashing, and after initiating PA he stated patient has Medicaid family planning only. This plan only covers birth control and one medication for STD. She does not have any other pharmacy coverage through her Medicaid.  [facility] pharmacy, spoke with [place] and advised her of patient not having Medicaid coverage for Vimpat. She verbalized understanding, stated she would call patient.

## 2016-10-27 ENCOUNTER — Ambulatory Visit: Payer: BLUE CROSS/BLUE SHIELD | Admitting: Neurology

## 2016-11-14 DIAGNOSIS — H16143 Punctate keratitis, bilateral: Secondary | ICD-10-CM | POA: Diagnosis not present

## 2016-11-22 DIAGNOSIS — H16143 Punctate keratitis, bilateral: Secondary | ICD-10-CM | POA: Diagnosis not present

## 2016-12-05 ENCOUNTER — Ambulatory Visit (INDEPENDENT_AMBULATORY_CARE_PROVIDER_SITE_OTHER): Payer: BLUE CROSS/BLUE SHIELD | Admitting: Neurology

## 2016-12-05 ENCOUNTER — Encounter: Payer: Self-pay | Admitting: Neurology

## 2016-12-05 VITALS — BP 106/69 | HR 79 | Ht 65.0 in | Wt 154.5 lb

## 2016-12-05 DIAGNOSIS — R569 Unspecified convulsions: Secondary | ICD-10-CM

## 2016-12-05 DIAGNOSIS — G40309 Generalized idiopathic epilepsy and epileptic syndromes, not intractable, without status epilepticus: Secondary | ICD-10-CM

## 2016-12-05 MED ORDER — CLONAZEPAM 0.5 MG PO TABS: 0.2500 mg | ORAL_TABLET | Freq: Every day | ORAL | 3 refills | Status: DC

## 2016-12-05 MED ORDER — ZONISAMIDE 25 MG PO CAPS: 50.0000 mg | ORAL_CAPSULE | Freq: Every day | ORAL | 3 refills | Status: DC

## 2016-12-05 MED ORDER — TOPIRAMATE 100 MG PO TABS: 50.0000 mg | ORAL_TABLET | Freq: Two times a day (BID) | ORAL | 3 refills | Status: DC

## 2016-12-05 MED ORDER — LACOSAMIDE 50 MG PO TABS: ORAL_TABLET | ORAL | 3 refills | Status: DC

## 2016-12-05 NOTE — Progress Notes (Signed)
PATIENT: Jennifer Luna DOB: 06/04/1990  REASON FOR VISIT: follow up- seizures HISTORY FROM: patient  HISTORY OF PRESENT ILLNESS:  Interval history for Jennifer Luna, a patient I have followed since 2011. Jennifer Luna is currently taking Vimpat 100 mg by mouth twice a day, topiramate 100 mg twice a day and Klonopin at bedtime. She continues to be well controlled. Her last seizure occurred in 2013. 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 She will loose health insurance through her parents in October, wants her refills on 90 day bases now. CMET ordered.   11-05-2015,Ms. [name] is a 26 year old female with a history of seizures. She continues to take Vimpat, Topamax and clonazepam. She reports that she is not had any seizuressince the last visit. She is able to operate a motor vehicle without difficulty. She is able to complete all ADLs independently. She states that her seizures typically consist of staring spells that last for several seconds. She denies any changes in her mood or behavior. She does state that she's recently been very tired. She states that she works 4 hours a day and is also in [employer]. She states that lately she has been very busy. She denies any changes with her walking or balance. However her mother speaks up and states that she is very stiff for a 26 year old.  She states that when she is gets out of bed in the morning she does feel stiff in her joints specifically the hips and knees. She states that when she gets going this improves.  The mother states that she has fibromyalgia and is wondering if some of the symptoms that her daughter has maybe related to fibromyalgia as well. She returns today to obtain refills for her antiepileptic medication. She also described recently a spell where she had some myoclonic jerking in the right elbow and right knee, she was out in public and left  the event concerned that this may proceed a seizure but this did not happen. Her last recorded seizures from 2013. Maree has been driving, she is currently in summer break from school.  HISTORY 10/29/14: [name] is a 26 y.o. female Is seen here as a revisit from Dr. Cleta Alberts. She has been one year seizure free and driving. She still has occasional little absence "blurps" but she has not had the loss of conversational flow and the secondary generalization. She had onset of seizures at age 70 . Her mother described the toddler coming up to her and sudden eye flutter and staring off, unresponsive.   Her last seizure in 2013 , April.  Driving to work, has 2 part time jobs. She has developed well, has become much more independent and can drive.   She's taking 2 Vimpat 100 mg by mouth twice a day, she's taking Topiramate 100 mg bid po.  She's taking Klonopin or 0.5 mg a half tablet by mouth at bedtime.  She has been informed about a VNS treatment option, she doesn't want now.    REVIEW OF SYSTEMS: Out of a complete 14 system review of symptoms, the patient complains only of the following symptoms, and all other reviewed systems are negative.  Fatigue, unexpected weight change  ALLERGIES: Allergies  Allergen Reactions  . Penicillins Rash    Has patient had a PCN reaction causing immediate rash, facial/tongue/throat swelling, SOB or lightheadedness with hypotension: No Has patient had a PCN reaction causing severe  rash involving mucus membranes or skin necrosis: Yes Has patient had a PCN reaction that required hospitalization No Has patient had a PCN reaction occurring within the last 10 years: No If all of the above answers are "NO", then may proceed with Cephalosporin use.   . Sulfa Antibiotics Rash  . Erythromycin Rash    HOME MEDICATIONS: Outpatient Medications Prior to Visit  Medication Sig Dispense Refill  . Ascorbic Acid (VITAMIN C PO) Take 1 tablet by mouth daily.      Jennifer Luna. CAMRESE 0.15-0.03 &0.01 MG tablet Take 1 tablet by mouth daily.  3  . clonazePAM (KLONOPIN) 0.5 MG tablet Take 0.5 tablets (0.25 mg total) by mouth at bedtime. 15 tablet 5  . Cyanocobalamin (VITAMIN B-12 PO) Take 1 tablet by mouth daily.    . folic acid (FOLVITE) 1 MG tablet Take 1 mg by mouth daily.    Jennifer Luna. ibuprofen (ADVIL,MOTRIN) 600 MG tablet Take 1 tablet (600 mg total) by mouth every 6 (six) hours as needed. 30 tablet 0  . lacosamide (VIMPAT) 50 MG TABS tablet Take 1 tablet in the AM and 2 tablets at bedtime (Patient taking differently: Take 50-100 mg by mouth 2 (two) times daily. Take 1 tablet in the AM and 2 tablets at bedtime) 90 tablet 5  . simethicone (INFANTS SIMETHICONE) 40 MG/0.6ML drops Take 0.6 mLs (40 mg total) by mouth 4 (four) times daily as needed. (Patient taking differently: Take 40 mg by mouth 4 (four) times daily as needed for flatulence. ) 30 mL 0  . topiramate (TOPAMAX) 100 MG tablet 50 mg in AM and 100 mg in PM po. (Patient taking differently: Take 50-100 mg by mouth 2 (two) times daily. 50 mg in AM and 100 mg in PM po.) 135 tablet 3  . zonisamide (ZONEGRAN) 25 MG capsule Take 2 capsules (50 mg total) by mouth at bedtime. 180 capsule 3  . dicyclomine (BENTYL) 20 MG tablet Take 1 tablet (20 mg total) by mouth 2 (two) times daily as needed (abdominal cramping). 20 tablet 0   No facility-administered medications prior to visit.     PAST MEDICAL HISTORY: Past Medical History:  Diagnosis Date  . Amenorrhea 01/11/2012   notes from office visit, PERIMENTRUAL PAINS  . Arthralgia 04/23/2013  . Epilepsy (HCC)    with absence seizures, abnormal EEG  . Seizures (HCC)    nx of absent and grand mal  . Shortness of breath 04/23/2013  . Sleepiness 01/11/2012   notes from office visit  . Sleepiness    DAYTIME  . Thrombocytopenia (HCC) 01/11/12   office notes from last visit, with zarontin and depakote    PAST SURGICAL HISTORY: Past Surgical History:  Procedure Laterality  Date  . shoulder injury      FAMILY HISTORY: Family History  Problem Relation Age of Onset  . Hypothyroidism Mother   . Arthritis Mother   . Hypertension Father   . Diabetes Father   . Hyperlipidemia Father   . Breast cancer Maternal Grandmother   . Heart disease Maternal Grandmother   . Hypertension Maternal Grandmother   . Heart disease Maternal Grandfather   . Heart disease Paternal Grandmother   . Rheum arthritis Paternal Grandmother   . Asthma Sister   . Lupus Maternal Aunt   . Seizures Paternal Uncle        as a child  . Rheum arthritis Maternal Aunt   . Asthma Unknown        Maternal Great Uncle   .  Asthma Sister        had as a child    SOCIAL HISTORY: Social History   Social History  . Marital status: Single    Spouse name: N/A  . Number of children: 0  . Years of education: 12   Occupational History  . 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 STUDENT Unemployed   Social History Main Topics  . Smoking status: Never Smoker  . Smokeless tobacco: Never Used  . Alcohol use No  . Drug use: No  . Sexual activity: Not on file   Other Topics Concern  . Not on file   Social History Narrative   Patient is single and lives at home with her parents.   Patient is working part-time.   Patient has a high school education.   Patient is right-handed.   Patient drinks two cups of coffee.      PHYSICAL EXAM  Vitals:   12/05/16 0834  BP: 106/69  Pulse: 79  Weight: 154 lb 8 oz (70.1 kg)  Height: 5\' 5"  (1.651 m)   Body mass index is 25.71 kg/m.  Generalized: Well developed, in no acute distress   Neurological examination  Mentation: Alert oriented to time, place, history taking. Follows all commands speech and language fluent Cranial nerve :  No change in taste or smell- Pupils were equal round reactive to light. Extraocular movements were full, no diplopia.   Facial sensation and strength were normal. Uvula tongue midline. Head turning and shoulder shrug  were  normal and symmetric. Motor: 5 /5 strength. She presented with symmetric motor tone throughout.  Sensory: Sensory testing is intact to soft touch . Coordination: intact finger-nose-finger movements bilaterally. There is no visible tremor, no ataxia or dysmetria noted nor reported ( handwriting) changes. Gait and station: Gait is normal.  Tandem gait is normal. Romberg is negative. No drift is seen.  Reflexes: Deep tendon reflexes are symmetric and normal bilaterally.   DIAGNOSTIC DATA (LABS, IMAGING, TESTING) - I reviewed patient records, labs, notes, testing and imaging myself where available. CMET q 6 month.   ASSESSMENT AND PLAN - 25 minute visit.  26 y.o. year old afro american right handed  female  has a past medical history of Amenorrhea (01/11/2012); Arthralgia (04/23/2013); Epilepsy (HCC); Seizures (HCC); Shortness of breath (04/23/2013); Sleepiness (01/11/2012); Sleepiness; and Thrombocytopenia (HCC) (01/11/12).   here with: last seizure recorded 2013, she is now working and is driving- She had in 2130 myoclonic jerks preceding a seizure, now reports still occassional myoclonic jerks.    1. Absence epilepsy, primary generalized epilepsy begun as a toddler.   2. She takes birth control pills to control menstrual cramping, not for birth control. She knows that the birth control effect can be reduced by taking antiepileptic medication.    Patient will continue on Vimpat, Topamax and clonazepam. Patient advised that she has any seizure she should let us know. Patient is having stiffness in the joints. I will check blood work today. If blood work is unremarkable she will need to follow-up with her primary care provider in regards to the stiffness. Patient verbalized understanding. She will follow-up in 6 months with NP [place].    Melvyn Novas, MD   12/05/2016, 8:42 AM [facility] [address] [place], [place] [address] [phone]

## 2016-12-06 ENCOUNTER — Telehealth: Payer: Self-pay

## 2016-12-06 LAB — COMPREHENSIVE METABOLIC PANEL
A/G RATIO: 2 (ref 1.2–2.2)
ALBUMIN: 4.5 g/dL (ref 3.5–5.5)
ALT: 19 IU/L (ref 0–32)
AST: 17 IU/L (ref 0–40)
Alkaline Phosphatase: 50 IU/L (ref 39–117)
BUN / CREAT RATIO: 15 (ref 9–23)
BUN: 10 mg/dL (ref 6–20)
Bilirubin Total: 0.3 mg/dL (ref 0.0–1.2)
CALCIUM: 9.2 mg/dL (ref 8.7–10.2)
CHLORIDE: 102 mmol/L (ref 96–106)
CO2: 23 mmol/L (ref 20–29)
CREATININE: 0.66 mg/dL (ref 0.57–1.00)
GFR, EST AFRICAN AMERICAN: 142 mL/min/{1.73_m2} (ref >59)
GFR, EST NON AFRICAN AMERICAN: 123 mL/min/{1.73_m2} (ref >59)
GLOBULIN, TOTAL: 2.2 g/dL (ref 1.5–4.5)
Glucose: 87 mg/dL (ref 65–99)
Potassium: 4.4 mmol/L (ref 3.5–5.2)
Sodium: 141 mmol/L (ref 134–144)
Total Protein: 6.7 g/dL (ref 6.0–8.5)

## 2016-12-06 NOTE — Telephone Encounter (Signed)
-----   Message from Melvyn Novasarmen Dohmeier, MD sent at 12/06/2016 11:33 AM EDT ----- All normal results, liver, kidney and electrolytes in normal range. CD

## 2016-12-06 NOTE — Telephone Encounter (Signed)
I called pt, no answer, left a detailed message on pt's home number, per [facility] DPR, advising her that her labs were normal and to call us back with any further questions or concerns.

## 2016-12-29 DIAGNOSIS — Z23 Encounter for immunization: Secondary | ICD-10-CM | POA: Diagnosis not present

## 2017-06-07 ENCOUNTER — Ambulatory Visit: Payer: BLUE CROSS/BLUE SHIELD | Admitting: Adult Health

## 2017-06-15 DIAGNOSIS — Z6827 Body mass index (BMI) 27.0-27.9, adult: Secondary | ICD-10-CM | POA: Diagnosis not present

## 2017-06-15 DIAGNOSIS — Z01419 Encounter for gynecological examination (general) (routine) without abnormal findings: Secondary | ICD-10-CM | POA: Diagnosis not present

## 2017-06-15 DIAGNOSIS — N760 Acute vaginitis: Secondary | ICD-10-CM | POA: Diagnosis not present

## 2017-07-10 ENCOUNTER — Ambulatory Visit: Payer: BLUE CROSS/BLUE SHIELD | Admitting: Family Medicine

## 2017-07-10 ENCOUNTER — Other Ambulatory Visit: Payer: Self-pay

## 2017-07-10 ENCOUNTER — Encounter: Payer: Self-pay | Admitting: Family Medicine

## 2017-07-10 VITALS — BP 104/72 | HR 91 | Temp 99.5°F | Resp 17 | Ht 65.0 in | Wt 151.2 lb

## 2017-07-10 DIAGNOSIS — L210 Seborrhea capitis: Secondary | ICD-10-CM | POA: Diagnosis not present

## 2017-07-10 DIAGNOSIS — H6122 Impacted cerumen, left ear: Secondary | ICD-10-CM

## 2017-07-10 DIAGNOSIS — J3089 Other allergic rhinitis: Secondary | ICD-10-CM | POA: Diagnosis not present

## 2017-07-10 DIAGNOSIS — H9201 Otalgia, right ear: Secondary | ICD-10-CM | POA: Diagnosis not present

## 2017-07-10 MED ORDER — CARBAMIDE PEROXIDE 6.5 % OT SOLN: 5.0000 [drp] | Freq: Two times a day (BID) | OTIC | 0 refills | Status: DC

## 2017-07-10 MED ORDER — KETOCONAZOLE 2 % EX SHAM: 1.0000 "application " | MEDICATED_SHAMPOO | CUTANEOUS | 0 refills | Status: DC

## 2017-07-10 NOTE — Progress Notes (Signed)
Chief Complaint  Patient presents with  . rash on chest x several months    rash on various spots on breast starts as a red spot then gets darker and the a circle forms around the outside with a white peel and the a scar formss  . right ear pain    would like MD to take a look at ear as she's tried ear gtts w/ no relief and ears flushed 3-4 yrs ago at [facility]    HPI  Patient reports a rash on her chest for several months She states that it starts off red as a spot like an oval then the edges are darker and start to peel then a scar forms.  It is mostly on her breast or under and her upper abdomen.  She has been having intermittent right ear pain when she gets congested In the past she had to have her ears flushed  She reports that she has been feeling some nasal congestion  She states that she feels it more in her right ear  Past Medical History:  Diagnosis Date  . Amenorrhea 01/11/2012   notes from office visit, PERIMENTRUAL PAINS  . Arthralgia 04/23/2013  . Epilepsy (HCC)    with absence seizures, abnormal EEG  . Seizures (HCC)    nx of absent and grand mal  . Shortness of breath 04/23/2013  . Sleepiness 01/11/2012   notes from office visit  . Sleepiness    DAYTIME  . Thrombocytopenia (HCC) 01/11/12   office notes from last visit, with zarontin and depakote    Current Outpatient Medications  Medication Sig Dispense Refill  . Ascorbic Acid (VITAMIN C PO) Take 1 tablet by mouth daily.    Marland Kitchen CAMRESE 0.15-0.03 &0.01 MG tablet Take 1 tablet by mouth daily.  3  . clonazePAM (KLONOPIN) 0.5 MG tablet Take 0.5 tablets (0.25 mg total) by mouth at bedtime. 45 tablet 3  . folic acid (FOLVITE) 1 MG tablet Take 1 mg by mouth daily.    Marland Kitchen ibuprofen (ADVIL,MOTRIN) 600 MG tablet Take 1 tablet (600 mg total) by mouth every 6 (six) hours as needed. 30 tablet 0  . lacosamide (VIMPAT) 50 MG TABS tablet Take 1 tablet in the AM and 2 tablets at bedtime 270 tablet 3  . simethicone (INFANTS  SIMETHICONE) 40 MG/0.6ML drops Take 0.6 mLs (40 mg total) by mouth 4 (four) times daily as needed. (Patient taking differently: Take 40 mg by mouth 4 (four) times daily as needed for flatulence. ) 30 mL 0  . topiramate (TOPAMAX) 100 MG tablet Take 0.5-1 tablets (50-100 mg total) by mouth 2 (two) times daily. 50 mg in AM and 100 mg in PM po. 270 tablet 3  . zonisamide (ZONEGRAN) 25 MG capsule Take 2 capsules (50 mg total) by mouth at bedtime. 180 capsule 3  . carbamide peroxide (DEBROX) 6.5 % OTIC solution Place 5 drops into the left ear 2 (two) times daily. 15 mL 0  . Cyanocobalamin (VITAMIN B-12 PO) Take 1 tablet by mouth daily.    Marland Kitchen ketoconazole (NIZORAL) 2 % shampoo Apply 1 application topically 2 (two) times a week. 120 mL 0   No current facility-administered medications for this visit.     Allergies:  Allergies  Allergen Reactions  . Penicillins Rash    Has patient had a PCN reaction causing immediate rash, facial/tongue/throat swelling, SOB or lightheadedness with hypotension: No Has patient had a PCN reaction causing severe rash involving mucus membranes or skin necrosis:  Yes Has patient had a PCN reaction that required hospitalization No Has patient had a PCN reaction occurring within the last 10 years: No If all of the above answers are "NO", then may proceed with Cephalosporin use.   . Sulfa Antibiotics Rash  . Erythromycin Rash    Past Surgical History:  Procedure Laterality Date  . shoulder injury      Social History   Socioeconomic History  . Marital status: Single    Spouse name: None  . Number of children: 0  . Years of education: 7812  . Highest education level: None  Social Needs  . Financial resource strain: None  . Food insecurity - worry: None  . Food insecurity - inability: None  . Transportation needs - medical: None  . Transportation needs - non-medical: None  Occupational History  . Occupation: Research officer, political partyTUDENT/Cashier    Employer: UNEMPLOYED  . Occupation:  DentistTUDENT    Employer: UNEMPLOYED  Tobacco Use  . Smoking status: Never Smoker  . Smokeless tobacco: Never Used  Substance and Sexual Activity  . Alcohol use: No  . Drug use: No  . Sexual activity: None  Other Topics Concern  . None  Social History Narrative   Patient is single and lives at home with her parents.   Patient is working part-time.   Patient has a high school education.   Patient is right-handed.   Patient drinks two cups of coffee.    Family History  Problem Relation Age of Onset  . Hypothyroidism Mother   . Arthritis Mother   . Hypertension Father   . Diabetes Father   . Hyperlipidemia Father   . Breast cancer Maternal Grandmother   . Heart disease Maternal Grandmother   . Hypertension Maternal Grandmother   . Heart disease Maternal Grandfather   . Heart disease Paternal Grandmother   . Rheum arthritis Paternal Grandmother   . Asthma Sister   . Lupus Maternal Aunt   . Seizures Paternal Uncle        as a child  . Rheum arthritis Maternal Aunt   . Asthma Unknown        Maternal Great Uncle   . Asthma Sister        had as a child     ROS Review of Systems See HPI Constitution: No fevers or chills No malaise No diaphoresis Skin: No rash or itching Eyes: no blurry vision, no double vision GU: no dysuria or hematuria Neuro: no dizziness or headaches all others reviewed and negative   Objective: Vitals:   07/10/17 0846  BP: 104/72  Pulse: 91  Resp: 17  Temp: 99.5 F (37.5 C)  TempSrc: Oral  SpO2: 99%  Weight: 151 lb 3.2 oz (68.6 kg)  Height: 5\' 5"  (1.651 m)    Physical Exam General: alert, oriented, in NAD Head: normocephalic, atraumatic, no sinus tenderness Eyes: EOM intact, no scleral icterus or conjunctival injection Ears: TM clear on right, left ear with impacted cerumen Nose: mucosa nonerythematous, nonedematous Throat: no pharyngeal exudate or erythema Lymph: no posterior auricular, submental or cervical lymph adenopathy Heart:  normal rate, normal sinus rhythm, no murmurs Lungs: clear to auscultation bilaterally, no wheezing Skin:   Assessment and Plan Tykeshia was seen today for rash on chest x several months and right ear pain.  Diagnoses and all orders for this visit:  Impacted cerumen, left ear- discussed debrox  Non-seasonal allergic rhinitis due to other allergic trigger- flonase  Pityriasis in adult- discussed skin care Trial of  nizoral   Otalgia of right ear- advised flonase and claritin  Other orders -     Cancel: Flu Vaccine QUAD 36+ mos IM -     Cancel: Tdap vaccine greater than or equal to 7yo IM -     carbamide peroxide (DEBROX) 6.5 % OTIC solution; Place 5 drops into the left ear 2 (two) times daily. -     ketoconazole (NIZORAL) 2 % shampoo; Apply 1 application topically 2 (two) times a week.     Instructions 1. Use ketoconazole shampoo from head to toe twice a week for a month 2. Use flonase and claritin daily in the morning to help with the right ear pain 3. Use debrox drops in the left ear as instructed on the bottle for 7-10 days    Zoe A Creta Levin

## 2017-07-10 NOTE — Patient Instructions (Addendum)
Instructions 1. Use ketoconazole shampoo from head to toe twice a week for a month 2. Use flonase and claritin daily in the morning to help with the right ear pain 3. Use debrox drops in the left ear as instructed on the bottle for 7-10 days    IF you received an x-ray today, you will receive an invoice from [facility]. Please contact [facility] at [phone] with questions or concerns regarding your invoice.   IF you received labwork today, you will receive an invoice from [place]. Please contact [facility] at [phone] with questions or concerns regarding your invoice.   Our billing staff will not be able to assist you with questions regarding bills from these companies.  You will be contacted with the lab results as soon as they are available. The fastest way to get your results is to activate your My Chart account. Instructions are located on the last page of this paperwork. If you have not heard from [place] regarding the results in 2 weeks, please contact this office.    Allergic Rhinitis, Adult Allergic rhinitis is an allergic reaction that affects the mucous membrane inside the nose. It causes sneezing, a runny or stuffy nose, and the feeling of mucus going down the back of the throat (postnasal drip). Allergic rhinitis can be mild to severe. There are two types of allergic rhinitis:  Seasonal. This type is also called hay fever. It happens only during certain seasons.  Perennial. This type can happen at any time of the year.  What are the causes? This condition happens when the body's defense system (immune system) responds to certain harmless substances called allergens as though they were germs.  Seasonal allergic rhinitis is triggered by pollen, which can come from grasses, trees, and weeds. Perennial allergic rhinitis may be caused by:  House dust mites.  Pet dander.  Mold spores.  What are the signs or symptoms? Symptoms of this condition  include:  Sneezing.  Runny or stuffy nose (nasal congestion).  Postnasal drip.  Itchy nose.  Tearing of the eyes.  Trouble sleeping.  Daytime sleepiness.  How is this diagnosed? This condition may be diagnosed based on:  Your medical history.  A physical exam.  Tests to check for related conditions, such as: ? Asthma. ? Pink eye. ? Ear infection. ? Upper respiratory infection.  Tests to find out which allergens trigger your symptoms. These may include skin or blood tests.  How is this treated? There is no cure for this condition, but treatment can help control symptoms. Treatment may include:  Taking medicines that block allergy symptoms, such as antihistamines. Medicine may be given as a shot, nasal spray, or pill.  Avoiding the allergen.  Desensitization. This treatment involves getting ongoing shots until your body becomes less sensitive to the allergen. This treatment may be done if other treatments do not help.  If taking medicine and avoiding the allergen does not work, new, stronger medicines may be prescribed.  Follow these instructions at home:  Find out what you are allergic to. Common allergens include smoke, dust, and pollen.  Avoid the things you are allergic to. These are some things you can do to help avoid allergens: ? Replace carpet with wood, tile, or vinyl flooring. Carpet can trap dander and dust. ? Do not smoke. Do not allow smoking in your home. ? Change your heating and air conditioning filter at least once a month. ? During allergy season:  Keep windows closed as much as possible.  Plan outdoor activities when pollen counts are lowest. This is usually during the evening hours.  When coming indoors, change clothing and shower before sitting on furniture or bedding.  Take over-the-counter and prescription medicines only as told by your health care provider.  Keep all follow-up visits as told by your health care provider. This is  important. Contact a health care provider if:  You have a fever.  You develop a persistent cough.  You make whistling sounds when you breathe (you wheeze).  Your symptoms interfere with your normal daily activities. Get help right away if:  You have shortness of breath. Summary  This condition can be managed by taking medicines as directed and avoiding allergens.  Contact your health care provider if you develop a persistent cough or fever.  During allergy season, keep windows closed as much as possible. This information is not intended to replace advice given to you by your health care provider. Make sure you discuss any questions you have with your health care provider. Document Released: 01/25/2001 Document Revised: 06/09/2016 Document Reviewed: 06/09/2016 Elsevier Interactive Patient Education  2018 [employer] Tinea Versicolor Tinea versicolor is a common fungal infection of the skin. It causes a rash that appears as light or dark patches on the skin. The rash most often occurs on the chest, back, neck, or upper arms. This condition is more common during warm weather. Other than affecting how your skin looks, tinea versicolor usually does not cause other problems. In most cases, the infection goes away in a few weeks with treatment. It may take a few months for the patches on your skin to clear up. What are the causes? Tinea versicolor occurs when a type of fungus that is normally present on the skin starts to overgrow. This fungus is a kind of yeast. The exact cause of the overgrowth is not known. This condition cannot be passed from one person to another (noncontagious). What increases the risk? This condition is more likely to develop when certain factors are present, such as:  Heat and humidity.  Sweating too much.  Hormone changes.  Oily skin.  A weak defense (immune) system.  What are the signs or symptoms? Symptoms of this condition may include:  A rash on  your skin that is made up of light or dark patches. The rash may have: ? Patches of tan or pink spots on light skin. ? Patches of white or brown spots on dark skin. ? Patches of skin that do not tan. ? Well-marked edges. ? Scales on the discolored areas.  Mild itching.  How is this diagnosed? A health care provider can usually diagnose this condition by looking at your skin. During the exam, he or she may use ultraviolet light to help determine the extent of the infection. In some cases, a skin sample may be taken by scraping the rash. This sample will be viewed under a microscope to check for yeast overgrowth. How is this treated? Treatment for this condition may include:  Dandruff shampoo that is applied to the affected skin during showers or bathing.  Over-the-counter medicated skin cream, lotion, or soaps.  Prescription antifungal medicine in the form of skin cream or pills.  Medicine to help reduce itching.  Follow these instructions at home:  Take medicines only as directed by your health care provider.  Apply dandruff shampoo to the affected area if told to do so by your health care provider. You may be instructed to scrub the affected skin for  several minutes each day.  Do not scratch the affected area of skin.  Avoid hot and humid conditions.  Do not use tanning booths.  Try to avoid sweating a lot. Contact a health care provider if:  Your symptoms get worse.  You have a fever.  You have redness, swelling, or pain at the site of your rash.  You have fluid, blood, or pus coming from your rash.  Your rash returns after treatment. This information is not intended to replace advice given to you by your health care provider. Make sure you discuss any questions you have with your health care provider. Document Released: 04/29/2000 Document Revised: 01/03/2016 Document Reviewed: 02/11/2014 Elsevier Interactive Patient Education  2018 [employer].

## 2017-07-12 NOTE — Telephone Encounter (Signed)
Error

## 2017-08-12 ENCOUNTER — Other Ambulatory Visit: Payer: Self-pay

## 2017-08-12 ENCOUNTER — Encounter: Payer: Self-pay | Admitting: Family Medicine

## 2017-08-12 ENCOUNTER — Ambulatory Visit: Payer: BLUE CROSS/BLUE SHIELD | Admitting: Family Medicine

## 2017-08-12 VITALS — BP 102/68 | HR 79 | Temp 98.6°F | Resp 16 | Ht 64.57 in | Wt 146.4 lb

## 2017-08-12 DIAGNOSIS — R221 Localized swelling, mass and lump, neck: Secondary | ICD-10-CM

## 2017-08-12 NOTE — Patient Instructions (Signed)
     IF you received an x-ray today, you will receive an invoice from [facility]. Please contact [facility] at [phone] with questions or concerns regarding your invoice.   IF you received labwork today, you will receive an invoice from [facility]. Please contact [facility] at [phone] with questions or concerns regarding your invoice.   Our billing staff will not be able to assist you with questions regarding bills from these companies.  You will be contacted with the lab results as soon as they are available. The fastest way to get your results is to activate your My Chart account. Instructions are located on the last page of this paperwork. If you have not heard from us regarding the results in 2 weeks, please contact this office.     

## 2017-08-12 NOTE — Progress Notes (Signed)
Chief Complaint  Patient presents with  . Mass    pt states she has noticed a lump behide the right ear and on the right side of her neck     HPI   Pt reports that her mother and sister were looking at her hair when they noticed that she had a bump behind her right ear a week ago It has not resolved and she is worried  She denies night sweats, weight loss or fevers or chills She states that she gets sore throat and ear infections often  She noticed that her right side of her neck also seemed enlarged She denies pain with swallowing sore throat or congestion She has not pain with turning her head.     Past Medical History:  Diagnosis Date  . Amenorrhea 01/11/2012   notes from office visit, PERIMENTRUAL PAINS  . Arthralgia 04/23/2013  . Epilepsy (HCC)    with absence seizures, abnormal EEG  . Seizures (HCC)    nx of absent and grand mal  . Shortness of breath 04/23/2013  . Sleepiness 01/11/2012   notes from office visit  . Sleepiness    DAYTIME  . Thrombocytopenia (HCC) 01/11/12   office notes from last visit, with zarontin and depakote    Current Outpatient Medications  Medication Sig Dispense Refill  . Ascorbic Acid (VITAMIN C PO) Take 1 tablet by mouth daily.    Marland Kitchen CAMRESE 0.15-0.03 &0.01 MG tablet Take 1 tablet by mouth daily.  3  . carbamide peroxide (DEBROX) 6.5 % OTIC solution Place 5 drops into the left ear 2 (two) times daily. 15 mL 0  . clonazePAM (KLONOPIN) 0.5 MG tablet Take 0.5 tablets (0.25 mg total) by mouth at bedtime. 45 tablet 3  . Cyanocobalamin (VITAMIN B-12 PO) Take 1 tablet by mouth daily.    . drospirenone-ethinyl estradiol (YAZ,GIANVI,LORYNA) 3-0.02 MG tablet   12  . folic acid (FOLVITE) 1 MG tablet Take 1 mg by mouth daily.    Marland Kitchen ibuprofen (ADVIL,MOTRIN) 600 MG tablet Take 1 tablet (600 mg total) by mouth every 6 (six) hours as needed. 30 tablet 0  . ketoconazole (NIZORAL) 2 % shampoo Apply 1 application topically 2 (two) times a week. 120 mL 0  .  lacosamide (VIMPAT) 50 MG TABS tablet Take 1 tablet in the AM and 2 tablets at bedtime 270 tablet 3  . simethicone (INFANTS SIMETHICONE) 40 MG/0.6ML drops Take 0.6 mLs (40 mg total) by mouth 4 (four) times daily as needed. (Patient taking differently: Take 40 mg by mouth 4 (four) times daily as needed for flatulence. ) 30 mL 0  . topiramate (TOPAMAX) 100 MG tablet Take 0.5-1 tablets (50-100 mg total) by mouth 2 (two) times daily. 50 mg in AM and 100 mg in PM po. 270 tablet 3  . zonisamide (ZONEGRAN) 25 MG capsule Take 2 capsules (50 mg total) by mouth at bedtime. 180 capsule 3   No current facility-administered medications for this visit.     Allergies:  Allergies  Allergen Reactions  . Penicillins Rash    Has patient had a PCN reaction causing immediate rash, facial/tongue/throat swelling, SOB or lightheadedness with hypotension: No Has patient had a PCN reaction causing severe rash involving mucus membranes or skin necrosis: Yes Has patient had a PCN reaction that required hospitalization No Has patient had a PCN reaction occurring within the last 10 years: No If all of the above answers are "NO", then may proceed with Cephalosporin use.   . Sulfa  Antibiotics Rash  . Erythromycin Rash    Past Surgical History:  Procedure Laterality Date  . shoulder injury      Social History   Socioeconomic History  . Marital status: Single    Spouse name: Not on file  . Number of children: 0  . Years of education: 312  . Highest education level: Not on file  Occupational History  . Occupation: Research officer, political partyTUDENT/Cashier    Employer: UNEMPLOYED  . Occupation: DentistTUDENT    Employer: UNEMPLOYED  Social Needs  . Financial resource strain: Not on file  . Food insecurity:    Worry: Not on file    Inability: Not on file  . Transportation needs:    Medical: Not on file    Non-medical: Not on file  Tobacco Use  . Smoking status: Never Smoker  . Smokeless tobacco: Never Used  Substance and Sexual  Activity  . Alcohol use: No  . Drug use: No  . Sexual activity: Not on file  Lifestyle  . Physical activity:    Days per week: Not on file    Minutes per session: Not on file  . Stress: Not on file  Relationships  . Social connections:    Talks on phone: Not on file    Gets together: Not on file    Attends religious service: Not on file    Active member of club or organization: Not on file    Attends meetings of clubs or organizations: Not on file    Relationship status: Not on file  Other Topics Concern  . Not on file  Social History Narrative   Patient is single and lives at home with her parents.   Patient is working part-time.   Patient has a high school education.   Patient is right-handed.   Patient drinks two cups of coffee.    Family History  Problem Relation Age of Onset  . Hypothyroidism Mother   . Arthritis Mother   . Hypertension Father   . Diabetes Father   . Hyperlipidemia Father   . Breast cancer Maternal Grandmother   . Heart disease Maternal Grandmother   . Hypertension Maternal Grandmother   . Heart disease Maternal Grandfather   . Heart disease Paternal Grandmother   . Rheum arthritis Paternal Grandmother   . Asthma Sister   . Lupus Maternal Aunt   . Seizures Paternal Uncle        as a child  . Rheum arthritis Maternal Aunt   . Asthma Unknown        Maternal Great Uncle   . Asthma Sister        had as a child     ROS Review of Systems See HPI Constitution: No fevers or chills No malaise No diaphoresis Skin: No rash or itching Eyes: no blurry vision, no double vision GU: no dysuria or hematuria Neuro: no dizziness or headaches * all others reviewed and negative   Objective: Vitals:   08/12/17 1359  BP: 102/68  Pulse: 79  Resp: 16  Temp: 98.6 F (37 C)  TempSrc: Oral  SpO2: 100%  Weight: 146 lb 6.4 oz (66.4 kg)  Height: 5' 4.57" (1.64 m)    Physical Exam  Constitutional: She is oriented to person, place, and time. She  appears well-developed and well-nourished.  HENT:  Head: Normocephalic and atraumatic.  Eyes: Conjunctivae and EOM are normal.  Neck: Normal range of motion. No JVD present. No tracheal deviation present. No thyromegaly present.  Right neck  enlarged on visual inspection. Edema of the right side of neck over the jugular vein. nontender to palpation. Posterior auricular lymph node palpable but nontender. No bruits  Cardiovascular: Normal rate, regular rhythm and normal heart sounds.  No murmur heard. Pulmonary/Chest: Effort normal and breath sounds normal. No stridor. No respiratory distress. She has no wheezes.  Lymphadenopathy:    She has no cervical adenopathy.  Neurological: She is alert and oriented to person, place, and time.  Psychiatric: She has a normal mood and affect. Her behavior is normal. Judgment and thought content normal.    Assessment and Plan Jennifer Luna was seen today for mass.  Diagnoses and all orders for this visit:  Enlargement of neck -     US Soft Tissue Head/Neck; Future -     VAS US CAROTID; Future   Pt has a normal postauricular lymph node Noted to have enlargement of the right neck No bruits on exam  Normal thyroid Will refer for ultrasound of neck to look at the anatomy and to analyze blood flow Jennifer Luna

## 2017-08-14 DIAGNOSIS — R221 Localized swelling, mass and lump, neck: Secondary | ICD-10-CM

## 2017-08-14 HISTORY — DX: Localized swelling, mass and lump, neck: R22.1

## 2017-08-15 ENCOUNTER — Other Ambulatory Visit: Payer: Self-pay | Admitting: *Deleted

## 2017-08-15 DIAGNOSIS — R221 Localized swelling, mass and lump, neck: Secondary | ICD-10-CM

## 2017-09-01 ENCOUNTER — Ambulatory Visit: Admission: RE | Admit: 2017-09-01 | Payer: BLUE CROSS/BLUE SHIELD | Source: Ambulatory Visit

## 2017-09-01 ENCOUNTER — Ambulatory Visit
Admission: RE | Admit: 2017-09-01 | Discharge: 2017-09-01 | Disposition: A | Discharge status: Home / Self Care | Payer: BLUE CROSS/BLUE SHIELD | Source: Ambulatory Visit | Attending: Family Medicine | Admitting: Family Medicine

## 2017-09-01 DIAGNOSIS — R220 Localized swelling, mass and lump, head: Secondary | ICD-10-CM | POA: Diagnosis not present

## 2017-09-01 DIAGNOSIS — R221 Localized swelling, mass and lump, neck: Secondary | ICD-10-CM

## 2017-09-04 ENCOUNTER — Ambulatory Visit: Payer: BLUE CROSS/BLUE SHIELD | Admitting: Adult Health

## 2017-09-05 ENCOUNTER — Encounter: Payer: Self-pay | Admitting: Adult Health

## 2017-09-05 ENCOUNTER — Ambulatory Visit: Payer: BLUE CROSS/BLUE SHIELD | Admitting: Adult Health

## 2017-09-05 VITALS — BP 101/65 | HR 75 | Ht 65.0 in | Wt 144.0 lb

## 2017-09-05 DIAGNOSIS — R569 Unspecified convulsions: Secondary | ICD-10-CM | POA: Diagnosis not present

## 2017-09-05 MED ORDER — LACOSAMIDE 50 MG PO TABS: ORAL_TABLET | ORAL | 5 refills | Status: DC

## 2017-09-05 NOTE — Progress Notes (Signed)
PATIENT: Jennifer Luna DOB: December 23, 1990  REASON FOR VISIT: follow up HISTORY FROM: patient  HISTORY OF PRESENT ILLNESS: Today 09/05/17 Ms. Layfield is a 27 year old female with a history of seizures.  She returns today for follow-up.  She is currently on Vimpat, Topamax, Zonegran and Klonopin.  She denies any seizure events.  Reports that she is tolerating her medications well.  She is able to complete all ADLs independently.  She operates a Librarian, academic without difficulty.  She had an ultrasound of the soft tissue on the right side of the neck that was unremarkable.  Reports that she has noticed that the right side if her neck was larger nad her primary care ordered this test.  She denies any new neurological symptoms.  She returns today for an evaluation.  HISTORY 12/05/16 (Copied from Dr. Oliva Bustard note): Interval history for [facility], a patient I have followed since 2011. Emilie is currently taking Vimpat 100 mg by mouth twice a day, topiramate 100 mg twice a day and Klonopin at bedtime. She continues to be well controlled. Her last seizure occurred in 2013. 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 She will loose health insurance through her parents in October, wants her refills on 90 day bases now. CMET ordered.    REVIEW OF SYSTEMS: Out of a complete 14 system review of symptoms, the patient complains only of the following symptoms, and all other reviewed systems are negative.  Seizure, moles  ALLERGIES: Allergies  Allergen Reactions  . Penicillins Rash    Has patient had a PCN reaction causing immediate rash, facial/tongue/throat swelling, SOB or lightheadedness with hypotension: No Has patient had a PCN reaction causing severe rash involving mucus membranes or skin necrosis: Yes Has patient had a PCN reaction that required hospitalization No Has patient had a PCN reaction  occurring within the last 10 years: No If all of the above answers are "NO", then may proceed with Cephalosporin use.   . Sulfa Antibiotics Rash  . Erythromycin Rash    HOME MEDICATIONS: Outpatient Medications Prior to Visit  Medication Sig Dispense Refill  . Ascorbic Acid (VITAMIN C PO) Take 1 tablet by mouth daily.    . carbamide peroxide (DEBROX) 6.5 % OTIC solution Place 5 drops into the left ear 2 (two) times daily. (Patient taking differently: Place 5 drops into the left ear 2 (two) times daily as needed. ) 15 mL 0  . clonazePAM (KLONOPIN) 0.5 MG tablet Take 0.5 tablets (0.25 mg total) by mouth at bedtime. 45 tablet 3  . drospirenone-ethinyl estradiol (YAZ,GIANVI,LORYNA) 3-0.02 MG tablet Take 1 tablet by mouth daily.   12  . folic acid (FOLVITE) 1 MG tablet Take 1 mg by mouth daily.    Marland Kitchen ibuprofen (ADVIL,MOTRIN) 600 MG tablet Take 1 tablet (600 mg total) by mouth every 6 (six) hours as needed. 30 tablet 0  . lacosamide (VIMPAT) 50 MG TABS tablet Take 1 tablet in the AM and 2 tablets at bedtime 270 tablet 3  . simethicone (INFANTS SIMETHICONE) 40 MG/0.6ML drops Take 0.6 mLs (40 mg total) by mouth 4 (four) times daily as needed. (Patient taking differently: Take 40 mg by mouth 4 (four) times daily as needed for flatulence. ) 30 mL 0  . topiramate (TOPAMAX) 100 MG tablet Take 0.5-1 tablets (50-100 mg total) by mouth 2 (two) times daily. 50 mg in AM and 100 mg in PM po. (  Patient taking differently: Take by mouth 2 (two) times daily. 50 mg in AM and 100 mg in PM po.) 270 tablet 3  . zonisamide (ZONEGRAN) 25 MG capsule Take 2 capsules (50 mg total) by mouth at bedtime. 180 capsule 3  . ketoconazole (NIZORAL) 2 % shampoo Apply 1 application topically 2 (two) times a week. (Patient not taking: Reported on 09/05/2017) 120 mL 0  . CAMRESE 0.15-0.03 &0.01 MG tablet Take 1 tablet by mouth daily.  3  . Cyanocobalamin (VITAMIN B-12 PO) Take 1 tablet by mouth daily.     No facility-administered  medications prior to visit.     PAST MEDICAL HISTORY: Past Medical History:  Diagnosis Date  . Amenorrhea 01/11/2012   notes from office visit, PERIMENTRUAL PAINS  . Arthralgia 04/23/2013  . Epilepsy (HCC)    with absence seizures, abnormal EEG  . Lump in neck 08/2017  . Seizures (HCC)    nx of absent and grand mal  . Shortness of breath 04/23/2013  . Sleepiness 01/11/2012   notes from office visit  . Sleepiness    DAYTIME  . Thrombocytopenia (HCC) 01/11/12   office notes from last visit, with zarontin and depakote    PAST SURGICAL HISTORY: Past Surgical History:  Procedure Laterality Date  . shoulder injury      FAMILY HISTORY: Family History  Problem Relation Age of Onset  . Hypothyroidism Mother   . Arthritis Mother   . Hypertension Father   . Diabetes Father   . Hyperlipidemia Father   . Breast cancer Maternal Grandmother   . Heart disease Maternal Grandmother   . Hypertension Maternal Grandmother   . Heart disease Maternal Grandfather   . Heart disease Paternal Grandmother   . Rheum arthritis Paternal Grandmother   . Asthma Sister   . Lupus Maternal Aunt   . Seizures Paternal Uncle        as a child  . Rheum arthritis Maternal Aunt   . Asthma Unknown        Maternal Great Uncle   . Asthma Sister        had as a child    SOCIAL HISTORY: Social History   Socioeconomic History  . Marital status: Single    Spouse name: Not on file  . Number of children: 0  . Years of education: 6812  . Highest education level: Not on file  Occupational History  . Occupation: Research officer, political partyTUDENT/Cashier    Employer: UNEMPLOYED  . Occupation: DentistTUDENT    Employer: UNEMPLOYED  Social Needs  . Financial resource strain: Not on file  . Food insecurity:    Worry: Not on file    Inability: Not on file  . Transportation needs:    Medical: Not on file    Non-medical: Not on file  Tobacco Use  . Smoking status: Never Smoker  . Smokeless tobacco: Never Used  Substance and Sexual  Activity  . Alcohol use: No  . Drug use: No  . Sexual activity: Not on file  Lifestyle  . Physical activity:    Days per week: Not on file    Minutes per session: Not on file  . Stress: Not on file  Relationships  . Social connections:    Talks on phone: Not on file    Gets together: Not on file    Attends religious service: Not on file    Active member of club or organization: Not on file    Attends meetings of clubs or organizations: Not  on file    Relationship status: Not on file  . Intimate partner violence:    Fear of current or ex partner: Not on file    Emotionally abused: Not on file    Physically abused: Not on file    Forced sexual activity: Not on file  Other Topics Concern  . Not on file  Social History Narrative   Patient is single and lives at home with her parents.   Patient is working part-time.   Patient has a high school education.   Patient is right-handed.   Patient drinks about 1-2 cups of hot tea daily      PHYSICAL EXAM  Vitals:   09/05/17 0943  BP: 101/65  Pulse: 75  Weight: 144 lb (65.3 kg)  Height: 5\' 5"  (1.651 m)   Body mass index is 23.96 kg/m.  Generalized: Well developed, in no acute distress   Neurological examination  Mentation: Alert oriented to time, place, history taking. Follows all commands speech and language fluent Cranial nerve II-XII: Pupils were equal round reactive to light. Extraocular movements were full, visual field were full on confrontational test. Facial sensation and strength were normal. Uvula tongue midline. Head turning and shoulder shrug  were normal and symmetric. Motor: The motor testing reveals 5 over 5 strength of all 4 extremities. Good symmetric motor tone is noted throughout.  Sensory: Sensory testing is intact to soft touch on all 4 extremities. No evidence of extinction is noted.  Coordination: Cerebellar testing reveals good finger-nose-finger and heel-to-shin bilaterally.  Gait and station: Gait is  normal. Tandem gait is normal. Romberg is negative. No drift is seen.  Reflexes: Deep tendon reflexes are symmetric and normal bilaterally.   DIAGNOSTIC DATA (LABS, IMAGING, TESTING) - I reviewed patient records, labs, notes, testing and imaging myself where available.  Lab Results  Component Value Date   WBC 7.0 07/04/2016   HGB 13.4 07/04/2016   HCT 39.5 07/04/2016   MCV 88.4 07/04/2016   PLT 220 07/04/2016      Component Value Date/Time   NA 141 12/05/2016 0949   K 4.4 12/05/2016 0949   CL 102 12/05/2016 0949   CO2 23 12/05/2016 0949   GLUCOSE 87 12/05/2016 0949   GLUCOSE 93 07/04/2016 2256   BUN 10 12/05/2016 0949   CREATININE 0.66 12/05/2016 0949   CREATININE 0.74 04/24/2013 1605   CALCIUM 9.2 12/05/2016 0949   PROT 6.7 12/05/2016 0949   ALBUMIN 4.5 12/05/2016 0949   AST 17 12/05/2016 0949   ALT 19 12/05/2016 0949   ALKPHOS 50 12/05/2016 0949   BILITOT 0.3 12/05/2016 0949   GFRNONAA 123 12/05/2016 0949   GFRAA 142 12/05/2016 0949   No results found for: CHOL, HDL, LDLCALC, LDLDIRECT, TRIG, CHOLHDL No results found for: ZOXW9U Lab Results  Component Value Date   VITAMINB12 911 04/24/2013   Lab Results  Component Value Date   TSH 0.272 (L) 04/15/2013      ASSESSMENT AND PLAN 27 y.o. year old female  has a past medical history of Amenorrhea (01/11/2012), Arthralgia (04/23/2013), Epilepsy (HCC), Lump in neck (08/2017), Seizures (HCC), Shortness of breath (04/23/2013), Sleepiness (01/11/2012), Sleepiness, and Thrombocytopenia (HCC) (01/11/12). here with:  1.  Seizures  Overall the patient is doing well.  She will continue on Topamax, Zonegran, Vimpat and Klonopin.  She is advised that if she has any seizure events she should let us know.  She will follow-up in 1 year or sooner if needed.  The patient only needed  a refill on Vimpat which was sent in.  Refill for Topamax, Zonegran and Klonopin are not due until July.    Butch Penny, MSN, NP-C 09/05/2017, 9:58  AM [facility] [address], [address] [place], [place] [address] [phone]  I spent 15 minutes with the patient. 50% of this time was spent discussing her medications

## 2017-09-05 NOTE — Patient Instructions (Addendum)
Your Plan:  Continue Vimpat, Zonegran, Topamax and Klonopin If you have any seizure events please let us know If your symptoms worsen or you develop new symptoms please let us know.    Thank you for [facility]. I hope we have been able to provide you high quality care today.  You may receive a patient satisfaction survey over the next few weeks. We would appreciate your feedback and comments so that we may continue to improve ourselves and the health of our patients.

## 2017-09-08 ENCOUNTER — Encounter: Payer: Self-pay | Admitting: Neurology

## 2017-09-11 ENCOUNTER — Other Ambulatory Visit: Payer: Self-pay | Admitting: Neurology

## 2017-09-11 MED ORDER — CLONAZEPAM 0.5 MG PO TABS: 0.2500 mg | ORAL_TABLET | Freq: Every day | ORAL | 4 refills | Status: DC

## 2017-09-11 NOTE — Progress Notes (Signed)
I agree with the assessment and plan as directed by NP .The patient is known to me .   Sweta Halseth, MD  

## 2017-12-14 ENCOUNTER — Encounter (HOSPITAL_COMMUNITY): Payer: Self-pay

## 2017-12-14 ENCOUNTER — Other Ambulatory Visit: Payer: Self-pay

## 2017-12-14 ENCOUNTER — Emergency Department (HOSPITAL_COMMUNITY)
Admission: EM | Admit: 2017-12-14 | Discharge: 2017-12-14 | Disposition: A | Discharge status: Home / Self Care | Payer: BLUE CROSS/BLUE SHIELD | Attending: Emergency Medicine | Admitting: Emergency Medicine

## 2017-12-14 DIAGNOSIS — Z79899 Other long term (current) drug therapy: Secondary | ICD-10-CM | POA: Insufficient documentation

## 2017-12-14 DIAGNOSIS — G40A09 Absence epileptic syndrome, not intractable, without status epilepticus: Secondary | ICD-10-CM | POA: Diagnosis not present

## 2017-12-14 DIAGNOSIS — R55 Syncope and collapse: Secondary | ICD-10-CM

## 2017-12-14 LAB — URINALYSIS, ROUTINE W REFLEX MICROSCOPIC
Bilirubin Urine: NEGATIVE
GLUCOSE, UA: NEGATIVE mg/dL
HGB URINE DIPSTICK: NEGATIVE
KETONES UR: NEGATIVE mg/dL
NITRITE: NEGATIVE
PROTEIN: NEGATIVE mg/dL
Specific Gravity, Urine: 1.023 (ref 1.005–1.030)
pH: 5 (ref 5.0–8.0)

## 2017-12-14 LAB — CBC
HEMATOCRIT: 43.6 % (ref 36.0–46.0)
Hemoglobin: 14.4 g/dL (ref 12.0–15.0)
MCH: 29.9 pg (ref 26.0–34.0)
MCHC: 33 g/dL (ref 30.0–36.0)
MCV: 90.6 fL (ref 78.0–100.0)
Platelets: 239 10*3/uL (ref 150–400)
RBC: 4.81 MIL/uL (ref 3.87–5.11)
RDW: 12.8 % (ref 11.5–15.5)
WBC: 6.1 10*3/uL (ref 4.0–10.5)

## 2017-12-14 LAB — BASIC METABOLIC PANEL
ANION GAP: 10 (ref 5–15)
BUN: 21 mg/dL — AB (ref 6–20)
CHLORIDE: 115 mmol/L — AB (ref 98–111)
CO2: 18 mmol/L — AB (ref 22–32)
Calcium: 8.8 mg/dL — ABNORMAL LOW (ref 8.9–10.3)
Creatinine, Ser: 0.84 mg/dL (ref 0.44–1.00)
GFR calc Af Amer: >60 mL/min (ref >60)
GLUCOSE: 102 mg/dL — AB (ref 70–99)
POTASSIUM: 3.8 mmol/L (ref 3.5–5.1)
Sodium: 143 mmol/L (ref 135–145)

## 2017-12-14 LAB — I-STAT BETA HCG BLOOD, ED (MC, WL, AP ONLY): I-stat hCG, quantitative: <5 m[IU]/mL (ref <5)

## 2017-12-14 LAB — CBG MONITORING, ED: GLUCOSE-CAPILLARY: 96 mg/dL (ref 70–99)

## 2017-12-14 NOTE — ED Triage Notes (Signed)
Pt states the last thing she remembers she was at [employer] to get some water. Pt states she does not remember anything beyond that . Pt states that she woke up on the ground. Pt is unsure if she hit her head. Pt alert and oriented in triage.

## 2017-12-14 NOTE — ED Provider Notes (Signed)
[facility]-EMERGENCY DEPT Provider Note   CSN: [id number] Arrival date & time: 12/14/17  1536     History   Chief Complaint Chief Complaint  Patient presents with  . Loss of Consciousness    HPI Jennifer Luna is a 27 y.o. female.  HPI   The patient was at a local supermarket today when she passed out.  She apparently hit her head.  She has a history of 4 different medications for control of seizure.  Saw her neurologist in April 2019 for evaluation and treatment.  No changes in care were done at that time.  Today she was shopping, went to get some water, remembers staggering some, and seeing a bright light then apparently passed out.  She awoke on the floor with people standing over her and talking to her.  Bystanders reported to the patient's mother that the patient's speech was garbled and she was complaining of abdominal pain when she awoke.  There is no report of shaking or seizing.  Patient came by private vehicle and that her mother when she arrived to the ED.  Her mother noted that the patient's skin tone was "grey," when she got here.  Patient does not think she had a seizure.  She has not had a seizure in a long time.  She denies biting her tongue.  She denies headache, neck pain, back pain, nausea, vomiting, cough, shortness of breath, weakness or dizziness.  At the time she was examined the ED she is denying abdominal pain.  There are no other known modifying factors.  Past Medical History:  Diagnosis Date  . Amenorrhea 01/11/2012   notes from office visit, PERIMENTRUAL PAINS  . Arthralgia 04/23/2013  . Epilepsy (HCC)    with absence seizures, abnormal EEG  . Lump in neck 08/2017  . Seizures (HCC)    nx of absent and grand mal  . Shortness of breath 04/23/2013  . Sleepiness 01/11/2012   notes from office visit  . Sleepiness    DAYTIME  . Thrombocytopenia (HCC) 01/11/12   office notes from last visit, with zarontin and depakote    Patient  Active Problem List   Diagnosis Date Noted  . Complex febrile convulsion (HCC) 11/05/2015  . Epilepsy, generalized, convulsive (HCC) 11/05/2015  . Absence epileptic syndrome, not intractable, w/o status epilepticus (HCC) 04/28/2014  . Arthralgia 04/23/2013  . Shortness of breath 04/23/2013  . Intractable absence epilepsy (HCC) 09/21/2012  . Pain in joint, shoulder region 09/21/2012    Past Surgical History:  Procedure Laterality Date  . shoulder injury       OB History   None      Home Medications    Prior to Admission medications   Medication Sig Start Date End Date Taking? Authorizing Provider  Ascorbic Acid (VITAMIN C PO) Take 1 tablet by mouth daily.   Yes [provider]  clonazePAM (KLONOPIN) 0.5 MG tablet Take 0.5 tablets (0.25 mg total) by mouth at bedtime. 09/11/17  Yes Dohmeier, Porfirio Mylar, MD  drospirenone-ethinyl estradiol (YAZ,GIANVI,LORYNA) 3-0.02 MG tablet Take 1 tablet by mouth daily.  08/02/17  Yes [provider]  folic acid (FOLVITE) 1 MG tablet Take 1 mg by mouth daily.   Yes [provider]  lacosamide (VIMPAT) 50 MG TABS tablet Take 1 tablet in the AM and 2 tablets at bedtime 09/05/17  Yes Butch Penny, NP  topiramate (TOPAMAX) 100 MG tablet Take 0.5-1 tablets (50-100 mg total) by mouth 2 (two) times daily. 50  mg in AM and 100 mg in PM po. 12/05/16  Yes Dohmeier, Porfirio Mylararmen, MD  zonisamide (ZONEGRAN) 25 MG capsule Take 2 capsules (50 mg total) by mouth at bedtime. 12/05/16  Yes Dohmeier, Porfirio Mylararmen, MD  carbamide peroxide (DEBROX) 6.5 % OTIC solution Place 5 drops into the left ear 2 (two) times daily. Patient not taking: Reported on 12/14/2017 07/10/17   Doristine BosworthStallings, Zoe A, MD  ibuprofen (ADVIL,MOTRIN) 600 MG tablet Take 1 tablet (600 mg total) by mouth every 6 (six) hours as needed. Patient not taking: Reported on 12/14/2017 07/05/16   Antony MaduraHumes, Kelly, PA-C  ketoconazole (NIZORAL) 2 % shampoo Apply 1 application topically 2 (two) times a week. Patient  not taking: Reported on 09/05/2017 07/10/17   Doristine BosworthStallings, Zoe A, MD  simethicone (INFANTS SIMETHICONE) 40 MG/0.6ML drops Take 0.6 mLs (40 mg total) by mouth 4 (four) times daily as needed. Patient not taking: Reported on 12/14/2017 02/27/13   Dohmeier, Porfirio Mylararmen, MD    Family History Family History  Problem Relation Age of Onset  . Hypothyroidism Mother   . Arthritis Mother   . Hypertension Father   . Diabetes Father   . Hyperlipidemia Father   . Breast cancer Maternal Grandmother   . Heart disease Maternal Grandmother   . Hypertension Maternal Grandmother   . Heart disease Maternal Grandfather   . Heart disease Paternal Grandmother   . Rheum arthritis Paternal Grandmother   . Asthma Sister   . Lupus Maternal Aunt   . Seizures Paternal Uncle        as a child  . Rheum arthritis Maternal Aunt   . Asthma Unknown        Maternal Great Uncle   . Asthma Sister        had as a child    Social History Social History   Tobacco Use  . Smoking status: Never Smoker  . Smokeless tobacco: Never Used  Substance Use Topics  . Alcohol use: No  . Drug use: No     Allergies   Penicillins; Sulfa antibiotics; and Erythromycin   Review of Systems Review of Systems  All other systems reviewed and are negative.    Physical Exam Updated Vital Signs BP 103/75   Pulse 71   Temp 97.6 F (36.4 C) (Oral)   Resp 16   Ht 5\' 5"  (1.651 m)   Wt 65.8 kg (145 lb)   SpO2 100%   BMI 24.13 kg/m   Physical Exam  Constitutional: She is oriented to person, place, and time. She appears well-developed and well-nourished.  HENT:  Head: Normocephalic and atraumatic.  No trauma to tongue.  Eyes: Pupils are equal, round, and reactive to light. Conjunctivae and EOM are normal.  Neck: Normal range of motion and phonation normal. Neck supple.  Cardiovascular: Normal rate and regular rhythm.  Pulmonary/Chest: Effort normal and breath sounds normal. She exhibits no tenderness.  Abdominal: Soft. She  exhibits no distension. There is no tenderness. There is no guarding.  Musculoskeletal: Normal range of motion.  Neurological: She is alert and oriented to person, place, and time. She exhibits normal muscle tone.  No dysarthria, aphasia or nystagmus.  No pronator drift.  Normal coordination.  Skin: Skin is warm and dry.  Psychiatric: She has a normal mood and affect. Her behavior is normal. Judgment and thought content normal.  Nursing note and vitals reviewed.    ED Treatments / Results  Labs (all labs ordered are listed, but only abnormal results are displayed) Labs Reviewed  BASIC METABOLIC PANEL - Abnormal; Notable for the following components:      Result Value   Chloride 115 (*)    CO2 18 (*)    Glucose, Bld 102 (*)    BUN 21 (*)    Calcium 8.8 (*)    All other components within normal limits  URINALYSIS, ROUTINE W REFLEX MICROSCOPIC - Abnormal; Notable for the following components:   Leukocytes, UA TRACE (*)    Bacteria, UA FEW (*)    All other components within normal limits  CBC  CBG MONITORING, ED  I-STAT BETA HCG BLOOD, ED ([facility], [facility], [facility] ONLY)    EKG EKG Interpretation  Date/Time:  Thursday December 14 2017 15:54:54 EDT Ventricular Rate:  64 PR Interval:    QRS Duration: 100 QT Interval:  405 QTC Calculation: 418 R Axis:   58 Text Interpretation:  Sinus rhythm RSR' in V1 or V2, right VCD or RVH since last tracing no significant change Confirmed by Mancel Bale [phone]) on 12/14/2017 4:30:56 PM   Radiology No results found.  Procedures Procedures (including critical care time)  Medications Ordered in ED Medications - No data to display   Initial Impression / Assessment and Plan / ED Course  I have reviewed the triage vital signs and the nursing notes.  Pertinent labs & imaging results that were available during my care of the patient were reviewed by me and considered in my medical decision making (see chart for details).  Clinical Course as of Dec 14 1812  Thu Dec 14, 2017  1804 Urinalysis, Routine w reflex microscopic(!) [EW]  1804 Normal except trace leukocytes and few bacteria, on clean-catch sample, therefore nondiagnostic.  Urinalysis, Routine w reflex microscopic(!) [EW]  1804 Normal finding  I-Stat beta hCG blood, ED [EW]  1804 Normal except for mild elevation chloride, low CO2, elevated glucose, elevated BUN, low calcium  Basic metabolic panel(!) [EW]  1804 Normal findings  CBC [EW]    Clinical Course User Index [EW] Mancel Bale, MD     Patient Vitals for the past 24 hrs:  BP Temp Temp src Pulse Resp SpO2 Height Weight  12/14/17 1730 103/75 - - 71 16 100 % - -  12/14/17 1700 107/76 - - 60 17 100 % - -  12/14/17 1659 102/72 - - 66 17 100 % - -  12/14/17 1552 - - - - - - 5\' 5"  (1.651 m) 65.8 kg (145 lb)  12/14/17 1551 100/74 97.6 F (36.4 C) Oral 73 14 100 % - -    6:06 PM Reevaluation with update and discussion. After initial assessment and treatment, an updated evaluation reveals no change in clinical status.  Findings discussed with patient and family members, all questions were answered. Mancel Bale   Medical Decision Making: Syncope, etiology unclear.  Possible mild dehydration.  Doubt UTI, serious bacterial infection, metabolic instability or impending vascular collapse.  No clinical or historical evidence for seizure causing this type of episode.   CRITICAL CARE-no Performed by: Mancel Bale   Nursing Notes Reviewed/ Care Coordinated Applicable Imaging Reviewed Interpretation of Laboratory Data incorporated into ED treatment  The patient appears reasonably screened and/or stabilized for discharge and I doubt any other medical condition or other [facility] requiring further screening, evaluation, or treatment in the ED at this time prior to discharge.  Plan: Home Medications-continue usual; Home Treatments-oral fluids and eat 3 meals each day; return here if the recommended treatment, does not improve the  symptoms; Recommended follow up-PCP, follow-up,  PRN     Final Clinical Impressions(s) / ED Diagnoses   Final diagnoses:  Syncope, unspecified syncope type    ED Discharge Orders    None       Mancel Bale, MD 12/14/17 1815

## 2017-12-14 NOTE — Discharge Instructions (Addendum)
The testing today was normal, and therefore reassuring.  You may be mildly dehydrated as a cause for your fainting.  Continue taking her seizure medicines as usual.  Make sure that you are getting plenty of rest, drinking a lot of fluids and eating 3 meals each day.  Return here or see the doctor of your choice for further problems with weakness, dizziness or passing out.

## 2017-12-25 ENCOUNTER — Encounter: Payer: Self-pay | Admitting: Adult Health

## 2017-12-26 ENCOUNTER — Other Ambulatory Visit: Payer: Self-pay

## 2017-12-26 DIAGNOSIS — R569 Unspecified convulsions: Secondary | ICD-10-CM

## 2017-12-26 MED ORDER — TOPIRAMATE 100 MG PO TABS: ORAL_TABLET | ORAL | 3 refills | Status: DC

## 2017-12-26 MED ORDER — ZONISAMIDE 25 MG PO CAPS
50.0000 mg | ORAL_CAPSULE | Freq: Every day | ORAL | 3 refills | Status: DC
Start: 2017-12-26 — End: 2018-09-06

## 2017-12-26 NOTE — Telephone Encounter (Signed)
Pateint has requested refills of Topamax and Zonegran via mychart to be sent to [facility] [address] Pateint recently saw Megan NP on 09/05/2017 and no changes were made. Refills submitted.

## 2017-12-27 DIAGNOSIS — Z23 Encounter for immunization: Secondary | ICD-10-CM | POA: Diagnosis not present

## 2018-02-02 ENCOUNTER — Telehealth: Payer: Self-pay | Admitting: Adult Health

## 2018-02-02 NOTE — Telephone Encounter (Signed)
I spoke to pt and relayed that we normally do not give excuse from jury duty due to seizures.  She states that stress brings on sz for her.  She is doing well with her job, taking her meds compliantly.  I would ask, and let her know next week.

## 2018-02-02 NOTE — Telephone Encounter (Signed)
Pt states she has jury duty and she is asking for a note to be excused from it.  Pt asking for a call re: this request

## 2018-02-05 NOTE — Telephone Encounter (Signed)
According to my notes, her seizures have been well controlled. This would not excuse her from jury duty.

## 2018-02-05 NOTE — Telephone Encounter (Signed)
LMVM for pt that having seizures would not excuse you from jury duty, and that her seizures have been well controlled on medication.  She is to call back if questions.

## 2018-05-09 ENCOUNTER — Encounter: Payer: Self-pay | Admitting: Family Medicine

## 2018-06-20 DIAGNOSIS — Z6826 Body mass index (BMI) 26.0-26.9, adult: Secondary | ICD-10-CM | POA: Diagnosis not present

## 2018-06-20 DIAGNOSIS — Z01419 Encounter for gynecological examination (general) (routine) without abnormal findings: Secondary | ICD-10-CM | POA: Diagnosis not present

## 2018-07-30 ENCOUNTER — Other Ambulatory Visit: Payer: Self-pay | Admitting: Adult Health

## 2018-07-30 NOTE — Addendum Note (Signed)
Addended by: [place] Pink on: 07/30/2018 04:22 PM   Modules accepted: Orders

## 2018-07-31 MED ORDER — LACOSAMIDE 50 MG PO TABS: ORAL_TABLET | ORAL | 5 refills | Status: DC

## 2018-08-28 ENCOUNTER — Telehealth: Payer: Self-pay | Admitting: Neurology

## 2018-08-28 NOTE — Telephone Encounter (Signed)
Due to current COVID 19 pandemic, our office is severely reducing in office visits, in order to minimize the risk to our patients and healthcare providers. Pt stated she will call back

## 2018-08-29 ENCOUNTER — Telehealth: Payer: Self-pay | Admitting: Adult Health

## 2018-08-29 NOTE — Telephone Encounter (Signed)
Called the patient back to advise the email will be sent out to her and for her to save that email for the upcoming apt on 4/23 at 10:30 am. I advised the patient I would also need to review her chart real fast and make sure everything is up to date.  There was no answer. LVM on cell

## 2018-08-29 NOTE — Telephone Encounter (Signed)
08-29-2018 Pt has called and gave verbal consent to file insurance for a VV Pt email address:[email or web address] pt asked to download [employer] webex app/program [email or web address]

## 2018-08-30 ENCOUNTER — Encounter: Payer: Self-pay | Admitting: Adult Health

## 2018-08-30 ENCOUNTER — Encounter: Payer: Self-pay | Admitting: Neurology

## 2018-09-06 ENCOUNTER — Ambulatory Visit: Payer: BLUE CROSS/BLUE SHIELD | Admitting: Adult Health

## 2018-09-06 ENCOUNTER — Encounter: Payer: Self-pay | Admitting: Neurology

## 2018-09-06 ENCOUNTER — Other Ambulatory Visit: Payer: Self-pay

## 2018-09-06 ENCOUNTER — Ambulatory Visit (INDEPENDENT_AMBULATORY_CARE_PROVIDER_SITE_OTHER): Payer: BLUE CROSS/BLUE SHIELD | Admitting: Neurology

## 2018-09-06 ENCOUNTER — Other Ambulatory Visit: Payer: Self-pay | Admitting: Neurology

## 2018-09-06 DIAGNOSIS — R569 Unspecified convulsions: Secondary | ICD-10-CM | POA: Diagnosis not present

## 2018-09-06 DIAGNOSIS — G40001 Localization-related (focal) (partial) idiopathic epilepsy and epileptic syndromes with seizures of localized onset, not intractable, with status epilepticus: Secondary | ICD-10-CM | POA: Insufficient documentation

## 2018-09-06 DIAGNOSIS — Z5181 Encounter for therapeutic drug level monitoring: Secondary | ICD-10-CM

## 2018-09-06 MED ORDER — LACOSAMIDE 50 MG PO TABS: ORAL_TABLET | ORAL | 5 refills | Status: DC

## 2018-09-06 MED ORDER — CLONAZEPAM 0.5 MG PO TABS: 0.2500 mg | ORAL_TABLET | Freq: Every day | ORAL | 5 refills | Status: DC

## 2018-09-06 MED ORDER — TOPIRAMATE 100 MG PO TABS: ORAL_TABLET | ORAL | 3 refills | Status: DC

## 2018-09-06 MED ORDER — ZONISAMIDE 25 MG PO CAPS
50.0000 mg | ORAL_CAPSULE | Freq: Every day | ORAL | 3 refills | Status: DC
Start: 2018-09-06 — End: 2019-03-12

## 2018-09-06 NOTE — Patient Instructions (Signed)
Epilepsy  Epilepsy is when a person keeps having seizures. A seizure is unusual activity in the brain. A seizure can change how you think or behave, and it can make it hard to be aware of what is happening.  This condition can cause problems, such as:  · Falls, accidents, and injury.  · Depression.  · Poor memory.  · Sudden unexplained death in epilepsy (SUDEP). This is rare. Its cause is not known.  Most people with epilepsy lead normal lives.  Follow these instructions at home:  Medicines    · Take medicines only as told by your doctor.  · Avoid anything that may keep your medicine from working, such as alcohol.  Activity  · Get enough rest. Lack of sleep can make seizures more likely to occur.  · Follow your doctor’s advice about driving, swimming, and doing anything else that would be dangerous if you had a seizure.  Teaching others  Teach friends and family what to do if you have a seizure. They should:  · Lay you on the ground to prevent a fall.  · Cushion your head and body.  · Loosen any tight clothing around your neck.  · Turn you on your side.  · Stay with you until you are better.  · Not hold you down.  · Not put anything in your mouth.  · Know whether or not you need emergency care.  General instructions  · Avoid anything that causes you to have seizures.  · Keep a seizure diary. Write down what you remember about each seizure, and especially what might have caused it.  · Keep all follow-up visits as told by your doctor. This is important.  Contact a doctor if:  · You have a change in your seizure pattern.  · You get an infection or start to feel sick. You may have more seizures when you are sick.  Get help right away if:  · A seizure does not stop after 5 minutes.  · You have more than one seizure in a row, and you do not have enough time between the seizures to feel better.  · A seizure makes it harder to breathe.  · A seizure is different from other seizures you have had.  · A seizure makes you unable  to speak or use a part of your body.  · You did not wake up right after a seizure.  This information is not intended to replace advice given to you by your health care provider. Make sure you discuss any questions you have with your health care provider.  Document Released: 02/27/2009 Document Revised: 12/07/2015 Document Reviewed: 11/10/2015  Elsevier Interactive Patient Education © 2019 [employer] Inc[employer]

## 2018-09-06 NOTE — Progress Notes (Signed)
PATIENT: Jennifer Luna DOB: Aug 26, 1990  REASON FOR VISIT: follow up- seizures HISTORY FROM: patient  Virtual Visit via Video Note  I connected with Jennifer Luna on 09/06/18 at 10:30 AM EDT by a video enabled telemedicine application and verified that I am speaking with the correct person using two identifiers.   I discussed the limitations of evaluation and management by telemedicine and the availability of in person appointments. The patient expressed understanding and agreed to proceed.  History of Present Illness:    Observations/Objective: patient with long standing seizure disorder. 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    Assessment and Plan:  No activity , doing well. refills needed, no ED visits, no unexplained activity, injury, LOC.   Follow Up Instructions: Just keep taking medication as refilled at current levels.  VIMPAT, TPM , Clonazepam, Zonisamide.    I discussed the assessment and treatment plan with the patient. The patient was provided an opportunity to ask questions and all were answered. The patient agreed with the plan and demonstrated an understanding of the instructions.   The patient was advised to call back or seek an in-person evaluation if the symptoms worsen or if the condition fails to improve as anticipated.  I provided 12 minutes of non-face-to-face time during this encounter.   Melvyn Novasarmen Selena Swaminathan, MD    HISTORY OF PRESENT ILLNESS:  Interval history for Jennifer Luna, a patient I have followed since 2011. Jennifer Luna is currently taking Vimpat 100 mg by mouth twice a day, topiramate 100 mg twice a day and Klonopin at bedtime. She continues to be well controlled. Her last seizure occurred in 2013. 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 She will loose health insurance through her parents in  October, wants her refills on 90 day bases now. CMET ordered.   11-05-2015,Ms. Jennifer ClimesFairfax is a 28 year old female with a history of seizures. She continues to take Vimpat, Topamax and clonazepam. She reports that she is not had any seizures since the last visit. She is able to operate a motor vehicle without difficulty. She is able to complete all ADLs independently. She states that her seizures typically consist of staring spells that last for several seconds. She denies any changes in her mood or behavior. She does state that she's recently been very tired. She states that she works 4 hours a day and is also in [employer]. She states that lately she has been very busy. She denies any changes with her walking or balance. However her mother speaks up and states that she is very stiff for a 28 year old.  She states that when she is gets out of bed in the morning she does feel stiff in her joints specifically the hips and knees. She states that when she gets going this improves.  The mother states that she has fibromyalgia and is wondering if some of the symptoms that her daughter has maybe related to fibromyalgia as well. She returns today to obtain refills for her antiepileptic medication. She also described recently a spell where she had some myoclonic jerking in the right elbow and right knee, she was out in public and left the event concerned that this may proceed a seizure but this did not happen. Her last recorded seizures from 2013. Jennifer Luna has been driving, she is currently in summer break from school.  HISTORY 10/29/14:  Jennifer Luna is a 28 y.o. female Is seen here as a revisit from Dr. Cleta Alberts. She has been one year seizure free and driving. She still has occasional little absence "blurps" but she has not had the loss of conversational flow and the secondary generalization. She had onset of seizures at age 3 . Her mother described the toddler coming up to her and sudden eye flutter and staring  off, unresponsive.   Her last seizure in 2013 , April.  Driving to work, has 2 part time jobs. She has developed well, has become much more independent and can drive.   She's taking 2 Vimpat 100 mg by mouth twice a day, she's taking Topiramate 100 mg bid po.  She's taking Klonopin or 0.5 mg a half tablet by mouth at bedtime.  She has been informed about a VNS treatment option, she doesn't want now.    REVIEW OF SYSTEMS: Out of a complete 14 system review of symptoms, the patient complains only of the following symptoms, and all other reviewed systems are negative.  Improved Fatigue, which I though had been Vimpat related.  Later bed time now during [place] stay at home.  Home exercise/    ALLERGIES: Allergies  Allergen Reactions   Penicillins Rash    Has patient had a PCN reaction causing immediate rash, facial/tongue/throat swelling, SOB or lightheadedness with hypotension: No Has patient had a PCN reaction causing severe rash involving mucus membranes or skin necrosis: Yes Has patient had a PCN reaction that required hospitalization No Has patient had a PCN reaction occurring within the last 10 years: No If all of the above answers are "NO", then may proceed with Cephalosporin use.    Sulfa Antibiotics Rash   Erythromycin Rash    HOME MEDICATIONS: Outpatient Medications Prior to Visit  Medication Sig Dispense Refill   Ascorbic Acid (VITAMIN C PO) Take 1 tablet by mouth daily.     carbamide peroxide (DEBROX) 6.5 % OTIC solution Place 5 drops into the left ear 2 (two) times daily. (Patient not taking: Reported on 12/14/2017) 15 mL 0   clonazePAM (KLONOPIN) 0.5 MG tablet Take 0.5 tablets (0.25 mg total) by mouth at bedtime. 30 tablet 4   drospirenone-ethinyl estradiol (YAZ,GIANVI,LORYNA) 3-0.02 MG tablet Take 1 tablet by mouth daily.   12   folic acid (FOLVITE) 1 MG tablet Take 1 mg by mouth daily.     ibuprofen (ADVIL,MOTRIN) 600 MG tablet Take 1 tablet (600 mg  total) by mouth every 6 (six) hours as needed. (Patient not taking: Reported on 12/14/2017) 30 tablet 0   ketoconazole (NIZORAL) 2 % shampoo Apply 1 application topically 2 (two) times a week. (Patient not taking: Reported on 09/05/2017) 120 mL 0   lacosamide (VIMPAT) 50 MG TABS tablet TAKE 1 TABLET BY MOUTH ONCE DAILY IN THE MORNING AND 2 AT BEDTIME 90 each 5   simethicone (INFANTS SIMETHICONE) 40 MG/0.6ML drops Take 0.6 mLs (40 mg total) by mouth 4 (four) times daily as needed. (Patient not taking: Reported on 12/14/2017) 30 mL 0   topiramate (TOPAMAX) 100 MG tablet 50 mg(0.5 tablets)  in AM and 100 mg (1 whole tablet) in PM by mouth. 270 tablet 3   zonisamide (ZONEGRAN) 25 MG capsule Take 2 capsules (50 mg total) by mouth at bedtime. 180 capsule 3   No facility-administered medications prior to visit.     PAST MEDICAL HISTORY: Past Medical History:  Diagnosis Date   Amenorrhea 01/11/2012   notes from  office visit, PERIMENTRUAL PAINS   Arthralgia 04/23/2013   Epilepsy (HCC)    with absence seizures, abnormal EEG   Lump in neck 08/2017   Seizures (HCC)    nx of absent and grand mal   Shortness of breath 04/23/2013   Sleepiness 01/11/2012   notes from office visit   Sleepiness    DAYTIME   Thrombocytopenia (HCC) 01/11/12   office notes from last visit, with zarontin and depakote    PAST SURGICAL HISTORY: Past Surgical History:  Procedure Laterality Date   shoulder injury      FAMILY HISTORY: Family History  Problem Relation Age of Onset   Hypothyroidism Mother    Arthritis Mother    Hypertension Father    Diabetes Father    Hyperlipidemia Father    Breast cancer Maternal Grandmother    Heart disease Maternal Grandmother    Hypertension Maternal Grandmother    Heart disease Maternal Grandfather    Heart disease Paternal Grandmother    Rheum arthritis Paternal Grandmother    Asthma Sister    Lupus Maternal Aunt    Seizures Paternal Uncle         as a child   Rheum arthritis Maternal Aunt    Asthma Other        Maternal Great Uncle    Asthma Sister        had as a child    SOCIAL HISTORY: Social History   Socioeconomic History   Marital status: Single    Spouse name: Not on file   Number of children: 0   Years of education: 12   Highest education level: Not on file  Occupational History   Occupation: Research officer, political party: UNEMPLOYED   Occupation: Dentist: UNEMPLOYED  Social Network engineer strain: Not on file   Food insecurity:    Worry: Not on file    Inability: Not on file   Transportation needs:    Medical: Not on file    Non-medical: Not on file  Tobacco Use   Smoking status: Never Smoker   Smokeless tobacco: Never Used  Substance and Sexual Activity   Alcohol use: No   Drug use: No   Sexual activity: Not on file  Lifestyle   Physical activity:    Days per week: Not on file    Minutes per session: Not on file   Stress: Not on file  Relationships   Social connections:    Talks on phone: Not on file    Gets together: Not on file    Attends religious service: Not on file    Active member of club or organization: Not on file    Attends meetings of clubs or organizations: Not on file    Relationship status: Not on file   Intimate partner violence:    Fear of current or ex partner: Not on file    Emotionally abused: Not on file    Physically abused: Not on file    Forced sexual activity: Not on file  Other Topics Concern   Not on file  Social History Narrative   Patient is single and lives at home with her parents.   Patient is working part-time.   Patient has a high school education.   Patient is right-handed.   Patient drinks about 1-2 cups of hot tea daily      Observation:  There were no vitals filed for this visit.  Generalized: Well developed 27  year old afro-american right handed female, well groomed and cooperative, pleasant- in no  acute distress   Neurological examination  Mentation: Alert oriented to time, place, history taking. Follows all commands speech and language fluent Cranial nerve :  No change in taste or smell- Pupils were equal round reactive to light. Extraocular movements were full,.   Facial strength  normal. Uvula and tongue midline, no tongue bites. . Head turning and shoulder shrug  were normal and symmetric.  DIAGNOSTIC DATA (LABS, IMAGING, TESTING) - I reviewed patient records, labs, notes, testing and imaging myself where available. CMET q 6 month.   ASSESSMENT AND PLAN - 12  minute  Video visit.   28 y.o. year old afro american right handed  female  has a past medical history of Amenorrhea (01/11/2012), Arthralgia (04/23/2013), Epilepsy (HCC), Lump in neck (08/2017), Seizures (HCC), Shortness of breath (04/23/2013), Sleepiness (01/11/2012), Sleepiness, and Thrombocytopenia (HCC) (01/11/12).   here with: last seizure recorded 2013, she is now working and is driving- She had in 1610 myoclonic jerks preceding a seizure, now reports still occassional myoclonic jerks.    1. 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 Her diagnosis is EPILEPSY, not just "seizures".   2. She takes birth control pills to control menstrual cramping, not for birth control. She knows that the birth control effect can be reduced by taking antiepileptic medication.    Patient will continue on Vimpat, Topamax and clonazepam.  Patient advised that she has any seizure she should let us know. Rv in 6 month with NP.   Melvyn Novas, MD   09/06/2018, 10:39 AM [facility] [address] [place], [place] [address] [phone]

## 2018-10-03 ENCOUNTER — Telehealth: Payer: Self-pay | Admitting: Neurology

## 2018-10-03 NOTE — Telephone Encounter (Signed)
Called the pt to schedule her for a 6 mth follow up. No answer. LVM for the pt to call back and schedule 6 mth follow up with NP

## 2019-01-14 DIAGNOSIS — Z23 Encounter for immunization: Secondary | ICD-10-CM | POA: Diagnosis not present

## 2019-03-12 ENCOUNTER — Ambulatory Visit: Payer: BLUE CROSS/BLUE SHIELD | Admitting: Neurology

## 2019-03-12 ENCOUNTER — Other Ambulatory Visit: Payer: Self-pay

## 2019-03-12 ENCOUNTER — Encounter: Payer: Self-pay | Admitting: Neurology

## 2019-03-12 VITALS — BP 118/82 | HR 91 | Temp 97.9°F | Ht 65.0 in | Wt 174.0 lb

## 2019-03-12 DIAGNOSIS — G40001 Localization-related (focal) (partial) idiopathic epilepsy and epileptic syndromes with seizures of localized onset, not intractable, with status epilepticus: Secondary | ICD-10-CM

## 2019-03-12 DIAGNOSIS — G40309 Generalized idiopathic epilepsy and epileptic syndromes, not intractable, without status epilepticus: Secondary | ICD-10-CM

## 2019-03-12 DIAGNOSIS — R569 Unspecified convulsions: Secondary | ICD-10-CM | POA: Diagnosis not present

## 2019-03-12 DIAGNOSIS — Z5181 Encounter for therapeutic drug level monitoring: Secondary | ICD-10-CM

## 2019-03-12 DIAGNOSIS — G40A09 Absence epileptic syndrome, not intractable, without status epilepticus: Secondary | ICD-10-CM | POA: Diagnosis not present

## 2019-03-12 MED ORDER — CLONAZEPAM 0.5 MG PO TABS: 0.2500 mg | ORAL_TABLET | Freq: Every day | ORAL | 5 refills | Status: DC

## 2019-03-12 MED ORDER — LACOSAMIDE 50 MG PO TABS: ORAL_TABLET | ORAL | 3 refills | Status: DC

## 2019-03-12 MED ORDER — ZONISAMIDE 25 MG PO CAPS: 50.0000 mg | ORAL_CAPSULE | Freq: Every day | ORAL | 3 refills | Status: DC

## 2019-03-12 MED ORDER — TOPIRAMATE 100 MG PO TABS
ORAL_TABLET | ORAL | 3 refills | Status: DC
Start: 2019-03-12 — End: 2019-09-11

## 2019-03-12 NOTE — Progress Notes (Signed)
PATIENT: Jennifer Luna DOB: October 22, 1990  REASON FOR VISIT: follow up- seizures HISTORY FROM: patient    I am seeing [name] on 03/12/19 at 10:30 AM EDT , her last visit was a tele-health visit in April 2020.  She has not had breakthrough seizures , she just needs refills.  Her mother has not noted any breakthrough activity.     Melvyn Novas, MD    HISTORY OF PRESENT ILLNESS:  Interval history for Jennifer Luna, a patient I have followed since 2011. Jennifer Luna is currently taking Vimpat 100 mg by mouth twice a day, topiramate 100 mg twice a day and Klonopin at bedtime. She continues to be well controlled. Her last seizure occurred in 2013. 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 She will loose health insurance through her parents in October, wants her refills on 90 day bases now. CMET ordered.   11-05-2015,Jennifer Luna is a 28 year old female with a history of seizures. She continues to take Vimpat, Topamax and clonazepam. She reports that she is not had any seizures since the last visit. She is able to operate a motor vehicle without difficulty. She is able to complete all ADLs independently. She states that her seizures typically consist of staring spells that last for several seconds. She denies any changes in her mood or behavior. She does state that she's recently been very tired. She states that she works 4 hours a day and is also in [employer]. She states that lately she has been very busy. She denies any changes with her walking or balance. However her mother speaks up and states that she is very stiff for a 28 year old.  She states that when she is gets out of bed in the morning she does feel stiff in her joints specifically the hips and knees. She states that when she gets going this improves.  The mother states that she has fibromyalgia and is wondering if some of the  symptoms that her daughter has maybe related to fibromyalgia as well. She returns today to obtain refills for her antiepileptic medication. She also described recently a spell where she had some myoclonic jerking in the right elbow and right knee, she was out in public and left the event concerned that this may proceed a seizure but this did not happen. Her last recorded seizures from 2013. Jennifer Luna has been driving, she is currently in summer break from school.  HISTORY 10/29/14: Jennifer Luna is a 28 y.o. female Is seen here as a revisit from Dr. Cleta Alberts. She has been one year seizure free and driving. She still has occasional little absence "blurps" but she has not had the loss of conversational flow and the secondary generalization. She had onset of seizures at age 21 . Her mother described the toddler coming up to her and sudden eye flutter and staring off, unresponsive.   Her last seizure in 2013 , April.  Driving to work, has 2 part time jobs. She has developed well, has become much more independent and can drive.   She's taking 2 Vimpat 100 mg by mouth twice a day, she's taking Topiramate 100 mg bid po.  She's taking Klonopin or 0.5 mg a half tablet by mouth at bedtime.  She has been informed about a VNS treatment option, she doesn't want now.    REVIEW OF SYSTEMS: Out of a complete 14 system review of symptoms,  the patient complains only of the following symptoms, and all other reviewed systems are negative.  Improved Fatigue, which I though had been Vimpat related.  Later bed time now during [place] stay at home. Lost her job, sadly.  Next week starting a new full time job.  Home exercise.    ALLERGIES: Allergies  Allergen Reactions  . Penicillins Rash    Has patient had a PCN reaction causing immediate rash, facial/tongue/throat swelling, SOB or lightheadedness with hypotension: No Has patient had a PCN reaction causing severe rash involving mucus membranes or skin  necrosis: Yes Has patient had a PCN reaction that required hospitalization No Has patient had a PCN reaction occurring within the last 10 years: No If all of the above answers are "NO", then may proceed with Cephalosporin use.   . Sulfa Antibiotics Rash  . Erythromycin Rash    HOME MEDICATIONS: Outpatient Medications Prior to Visit  Medication Sig Dispense Refill  . Ascorbic Acid (VITAMIN C PO) Take 1 tablet by mouth daily.    . clonazePAM (KLONOPIN) 0.5 MG tablet Take 0.5 tablets (0.25 mg total) by mouth at bedtime. 30 tablet 5  . drospirenone-ethinyl estradiol (YAZ,GIANVI,LORYNA) 3-0.02 MG tablet Take 1 tablet by mouth daily.   12  . folic acid (FOLVITE) 1 MG tablet Take 1 mg by mouth daily.    Marland Kitchen ibuprofen (ADVIL,MOTRIN) 600 MG tablet Take 1 tablet (600 mg total) by mouth every 6 (six) hours as needed. 30 tablet 0  . ketoconazole (NIZORAL) 2 % shampoo Apply 1 application topically 2 (two) times a week. 120 mL 0  . lacosamide (VIMPAT) 50 MG TABS tablet TAKE 1 TABLET BY MOUTH ONCE DAILY IN THE MORNING AND 2 AT BEDTIME 90 each 5  . Multiple Vitamin (MULTIVITAMIN) tablet Take 1 tablet by mouth daily.    . Multiple Vitamins-Minerals (HAIR SKIN AND NAILS FORMULA PO) Take by mouth daily.    . simethicone (INFANTS SIMETHICONE) 40 MG/0.6ML drops Take 0.6 mLs (40 mg total) by mouth 4 (four) times daily as needed. 30 mL 0  . topiramate (TOPAMAX) 100 MG tablet 50 mg(0.5 tablets)  in AM and 100 mg (1 whole tablet) in PM by mouth. 270 tablet 3  . zonisamide (ZONEGRAN) 25 MG capsule Take 2 capsules (50 mg total) by mouth at bedtime. 180 capsule 3  . carbamide peroxide (DEBROX) 6.5 % OTIC solution Place 5 drops into the left ear 2 (two) times daily. (Patient not taking: Reported on 12/14/2017) 15 mL 0   No facility-administered medications prior to visit.     PAST MEDICAL HISTORY: Past Medical History:  Diagnosis Date  . Amenorrhea 01/11/2012   notes from office visit, PERIMENTRUAL PAINS  .  Arthralgia 04/23/2013  . Epilepsy ([place])    with absence seizures, abnormal EEG  . Lump in neck 08/2017  . Seizures ([place])    nx of absent and grand mal  . Shortness of breath 04/23/2013  . Sleepiness 01/11/2012   notes from office visit  . Sleepiness    DAYTIME  . Thrombocytopenia ([place]) 01/11/12   office notes from last visit, with zarontin and depakote    PAST SURGICAL HISTORY: Past Surgical History:  Procedure Laterality Date  . shoulder injury      FAMILY HISTORY: Family History  Problem Relation Age of Onset  . Hypothyroidism Mother   . Arthritis Mother   . Hypertension Father   . Diabetes Father   . Hyperlipidemia Father   . Breast cancer Maternal Grandmother   .  Heart disease Maternal Grandmother   . Hypertension Maternal Grandmother   . Heart disease Maternal Grandfather   . Heart disease Paternal Grandmother   . Rheum arthritis Paternal Grandmother   . Asthma Sister   . Lupus Maternal Aunt   . Seizures Paternal Uncle        as a child  . Rheum arthritis Maternal Aunt   . Asthma Other        Maternal Great Uncle   . Asthma Sister        had as a child    SOCIAL HISTORY: Social History   Socioeconomic History  . Marital status: Single    Spouse name: Not on file  . Number of children: 0  . Years of education: 86  . Highest education level: Not on file  Occupational History  . Occupation: Research officer, political party: UNEMPLOYED  . Occupation: Dentist: UNEMPLOYED  Social Needs  . Financial resource strain: Not on file  . Food insecurity    Worry: Not on file    Inability: Not on file  . Transportation needs    Medical: Not on file    Non-medical: Not on file  Tobacco Use  . Smoking status: Never Smoker  . Smokeless tobacco: Never Used  Substance and Sexual Activity  . Alcohol use: No  . Drug use: No  . Sexual activity: Not on file  Lifestyle  . Physical activity    Days per week: Not on file    Minutes per session: Not on file   . Stress: Not on file  Relationships  . Social Musician on phone: Not on file    Gets together: Not on file    Attends religious service: Not on file    Active member of club or organization: Not on file    Attends meetings of clubs or organizations: Not on file    Relationship status: Not on file  . Intimate partner violence    Fear of current or ex partner: Not on file    Emotionally abused: Not on file    Physically abused: Not on file    Forced sexual activity: Not on file  Other Topics Concern  . Not on file  Social History Narrative   Patient is single and lives at home with her parents.   Patient is working part-time.   Patient has a high school education.   Patient is right-handed.   Patient drinks about 1-2 cups of hot tea daily      Observation:  Vitals:   03/12/19 1017  BP: 118/82  Pulse: 91  Temp: 97.9 F (36.6 C)  Weight: 174 lb (78.9 kg)  Height: 5\' 5"  (1.651 m)    Generalized: Well developed 28 year old afro-american right handed female, well groomed and cooperative, pleasant- in no acute distress   Neurological examination  Mentation: Alert oriented to time, place, history taking. Follows all commands speech and language fluent Cranial nerve :  No change in taste or smell-  Pupils were equal round reactive to light. Extraocular movements were full,.   Facial strength  normal. No sensory loss.  Uvula and tongue midline, no tongue bites.  Head turning and shoulder shrug  were normal and symmetric.  Motor tone and bulk and strength is symmetrical. No tremor, no ataxia.   gait intact, sensory intact. DTR symmetric 1 plus.   DIAGNOSTIC DATA (LABS, IMAGING, TESTING) - I reviewed patient records, labs,  notes, testing and imaging myself where available. CMET q 6 month.   ASSESSMENT AND PLAN -  15 minute routine revisit for refills and medication management.   28 y.o. year old afro-american right handed, single  female patient with a  medical history of Amenorrhea (01/11/2012), Arthralgia (04/23/2013), Epilepsy ([facility])- absence , generalized/  Shortness of breath (04/23/2013), Sleepiness (01/11/2012), Sleepiness, and Thrombocytopenia ([facility]) (01/11/12).   Here with: last seizure recorded 2013, she is now working and is driving- She had in [id number] myoclonic jerks preceding a seizure, now reports still occassional myoclonic jerks.    1. 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 Her diagnosis is EPILEPSY, not just "seizures".   2. She takes birth control pills to control menstrual cramping, not for birth control. She knows that the birth control effect can be reduced by taking antiepileptic medication.    Patient will continue on Vimpat, Topamax and clonazepam.  Patient advised that she has any seizure she should let us know. Rv in 6 month with NP.     Melvyn Novasarmen Racquel Arkin, MD   03/12/2019, 10:57 AM [facility] [address], [address] [place], [place] [address] [phone]

## 2019-03-13 ENCOUNTER — Telehealth: Payer: Self-pay | Admitting: Neurology

## 2019-03-13 ENCOUNTER — Encounter: Payer: Self-pay | Admitting: Neurology

## 2019-03-13 LAB — COMPREHENSIVE METABOLIC PANEL
ALT: 16 IU/L (ref 0–32)
AST: 16 IU/L (ref 0–40)
Albumin/Globulin Ratio: 2.1 (ref 1.2–2.2)
Albumin: 4.8 g/dL (ref 3.9–5.0)
Alkaline Phosphatase: 60 IU/L (ref 39–117)
BUN/Creatinine Ratio: 13 (ref 9–23)
BUN: 9 mg/dL (ref 6–20)
Bilirubin Total: 0.4 mg/dL (ref 0.0–1.2)
CO2: 23 mmol/L (ref 20–29)
Calcium: 9.6 mg/dL (ref 8.7–10.2)
Chloride: 102 mmol/L (ref 96–106)
Creatinine, Ser: 0.69 mg/dL (ref 0.57–1.00)
GFR calc Af Amer: 137 mL/min/{1.73_m2} (ref >59)
GFR calc non Af Amer: 119 mL/min/{1.73_m2} (ref >59)
Globulin, Total: 2.3 g/dL (ref 1.5–4.5)
Glucose: 93 mg/dL (ref 65–99)
Potassium: 4.5 mmol/L (ref 3.5–5.2)
Sodium: 137 mmol/L (ref 134–144)
Total Protein: 7.1 g/dL (ref 6.0–8.5)

## 2019-03-13 LAB — CBC WITH DIFFERENTIAL/PLATELET
Basophils Absolute: 0 10*3/uL (ref 0.0–0.2)
Basos: 1 %
EOS (ABSOLUTE): 0.1 10*3/uL (ref 0.0–0.4)
Eos: 1 %
Hematocrit: 42.1 % (ref 34.0–46.6)
Hemoglobin: 13.9 g/dL (ref 11.1–15.9)
Immature Grans (Abs): 0 10*3/uL (ref 0.0–0.1)
Immature Granulocytes: 0 %
Lymphocytes Absolute: 1.6 10*3/uL (ref 0.7–3.1)
Lymphs: 39 %
MCH: 29.5 pg (ref 26.6–33.0)
MCHC: 33 g/dL (ref 31.5–35.7)
MCV: 89 fL (ref 79–97)
Monocytes Absolute: 0.4 10*3/uL (ref 0.1–0.9)
Monocytes: 10 %
Neutrophils Absolute: 1.9 10*3/uL (ref 1.4–7.0)
Neutrophils: 49 %
Platelets: 202 10*3/uL (ref 150–450)
RBC: 4.71 x10E6/uL (ref 3.77–5.28)
RDW: 12.2 % (ref 11.7–15.4)
WBC: 4 10*3/uL (ref 3.4–10.8)

## 2019-03-13 NOTE — Telephone Encounter (Signed)
Called the patient and advised of the normal lab findings. Advised the pt to continue current medications and we will see her in 6 mths. Pt verbalized understanding. Pt had no questions at this time but was encouraged to call back if questions arise.

## 2019-03-13 NOTE — Telephone Encounter (Signed)
-----   Message from Larey Seat, MD sent at 03/13/2019  8:42 AM EDT ----- All metabolic labs are normal, normal blood cell counts.   PS : I asked Freddye to ask Dr Nolon Rod to alternate Labs with me, every 6 month.

## 2019-03-27 IMAGING — US US SOFT TISSUE HEAD/NECK
1 series · 14 of 25 positions shown · non-contrast
Comparison: None.

CLINICAL DATA: Soft tissue swelling right posterior auricular
region. Enlarged lymph node.

EXAM:
ULTRASOUND OF HEAD/NECK SOFT TISSUES
TECHNIQUE: Ultrasound examination of the head and neck soft tissues was
performed in the area of clinical concern.

[Series 1: us soft tissue head/neck · 0.07mm/px · 14 of 34 slices shown]
[im 1/34]
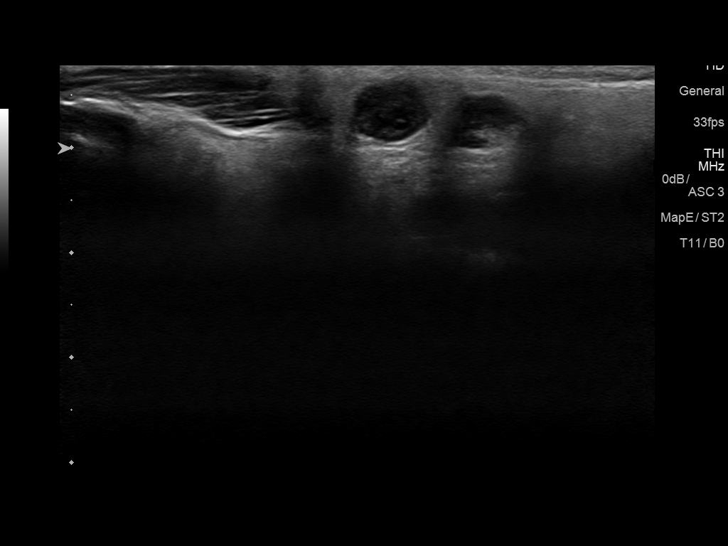
[im 3/34]
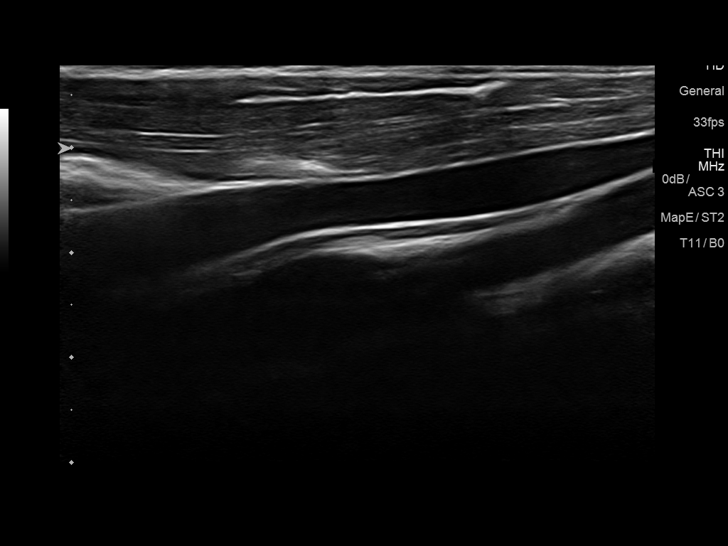
[im 6/34]
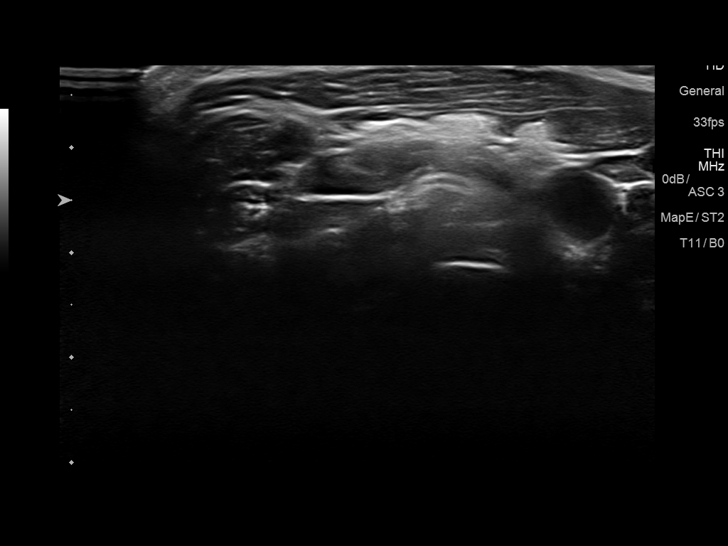
[im 9/34]
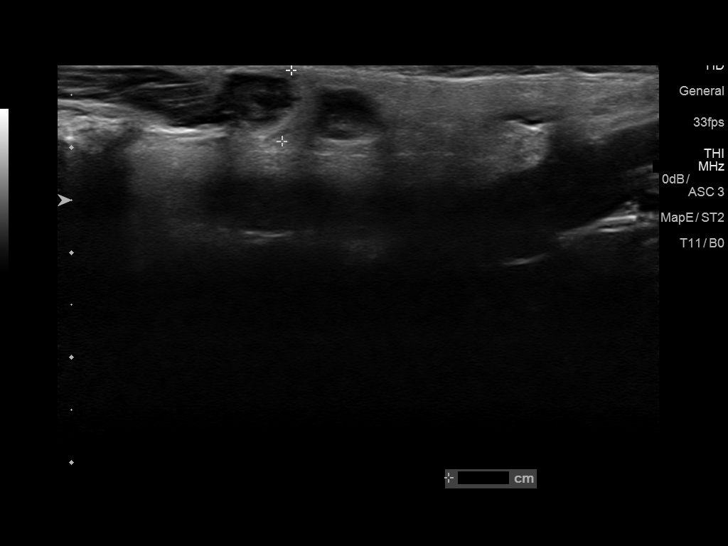
[im 12/34]
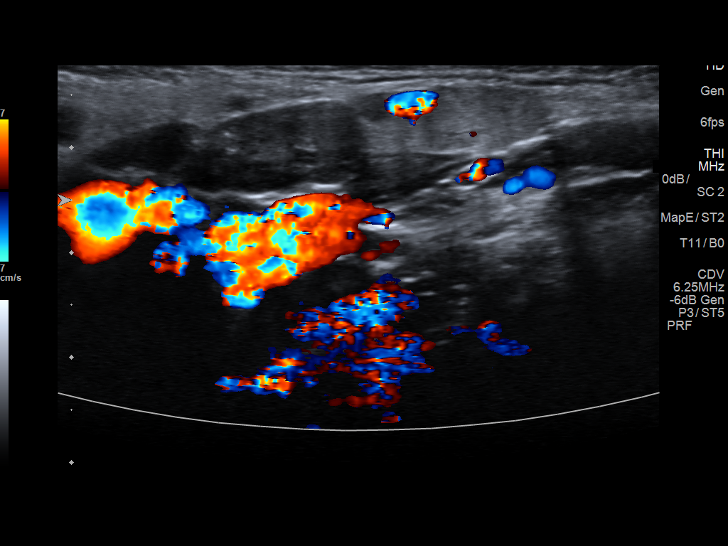
[im 13/34]
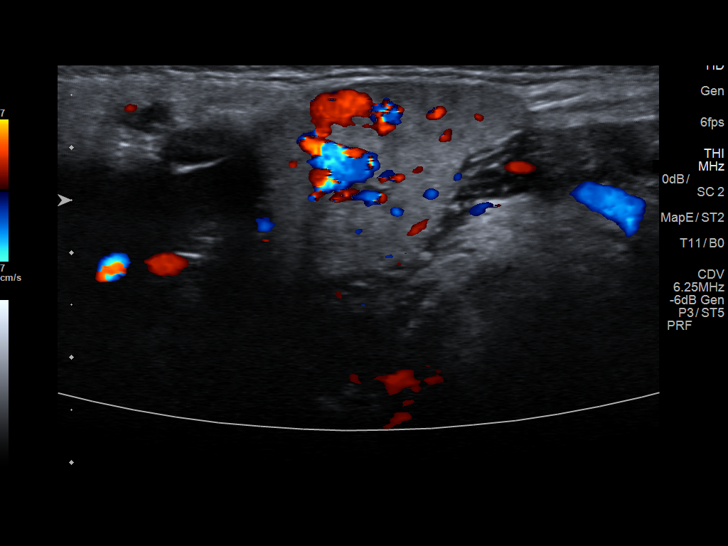
[im 16/34]
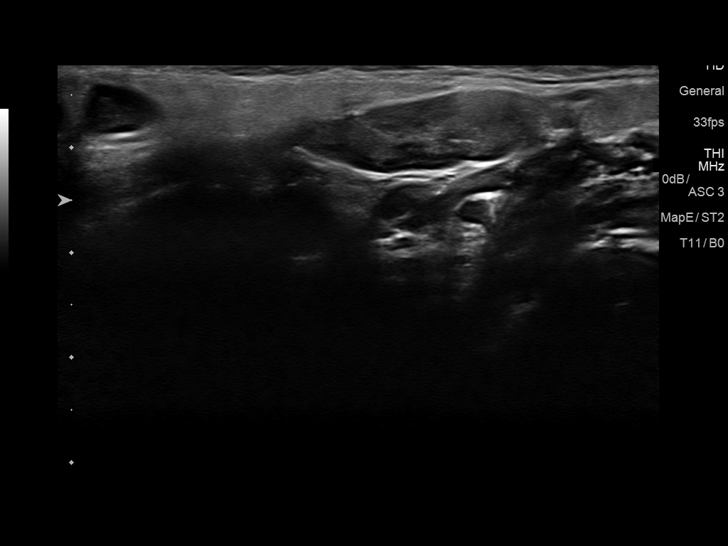
[im 18/34]
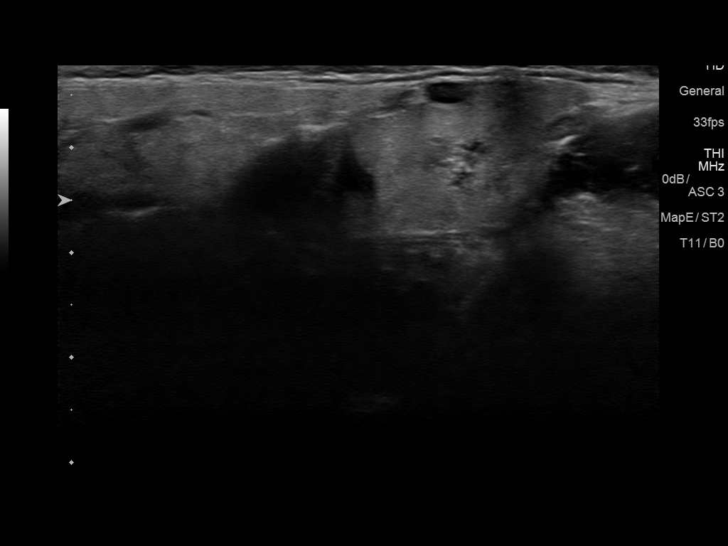
[im 21/34]
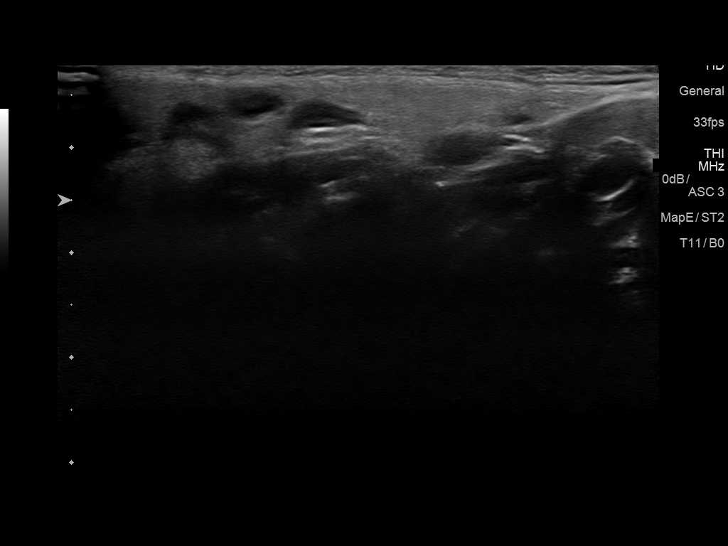
[im 23/34]
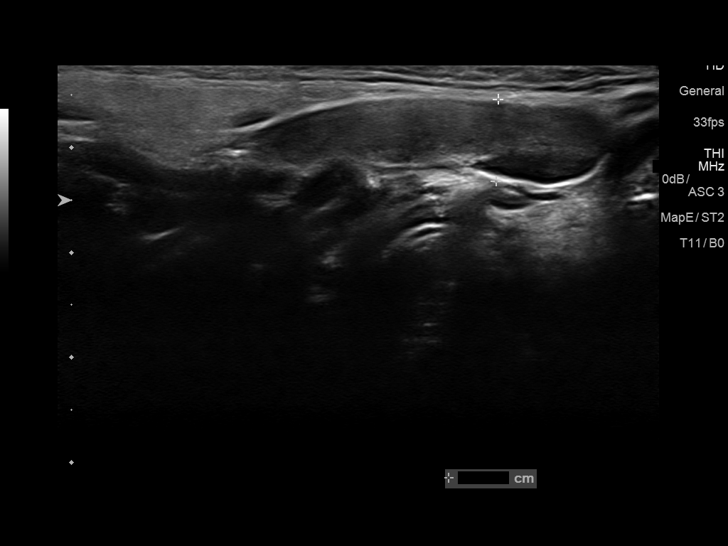
[im 25/34]
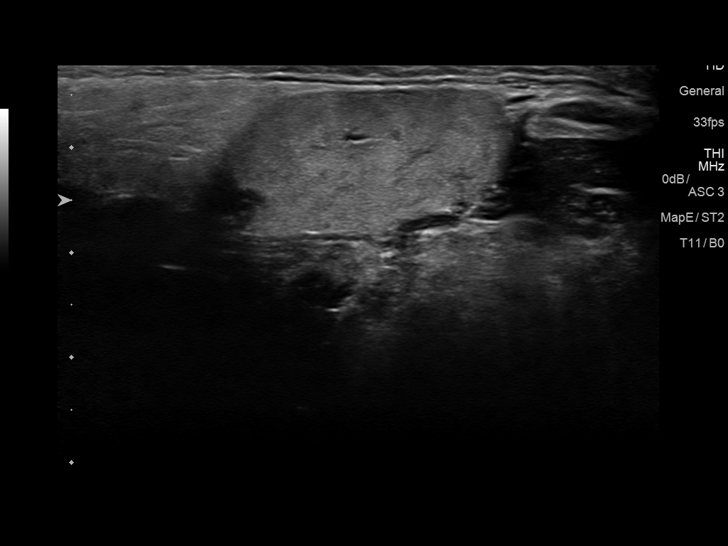
[im 28/34]
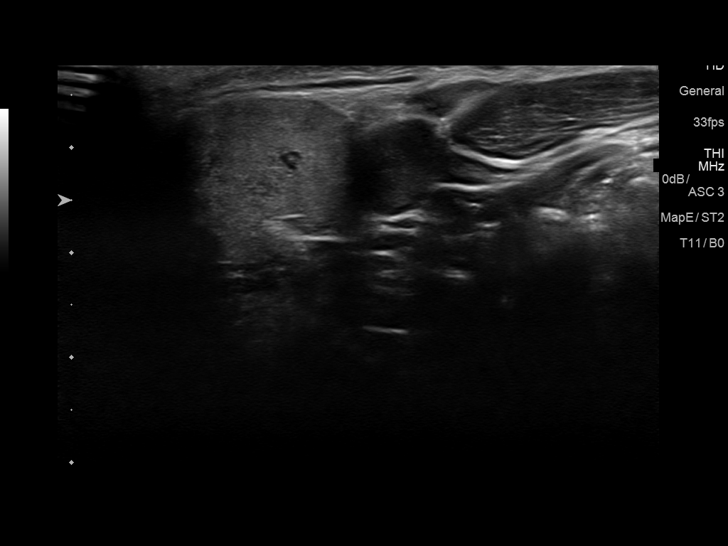
[im 31/34]
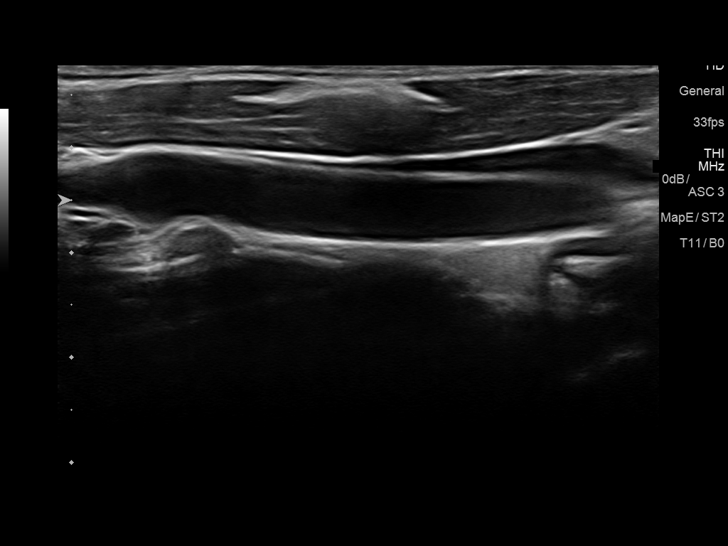
[im 34/34]
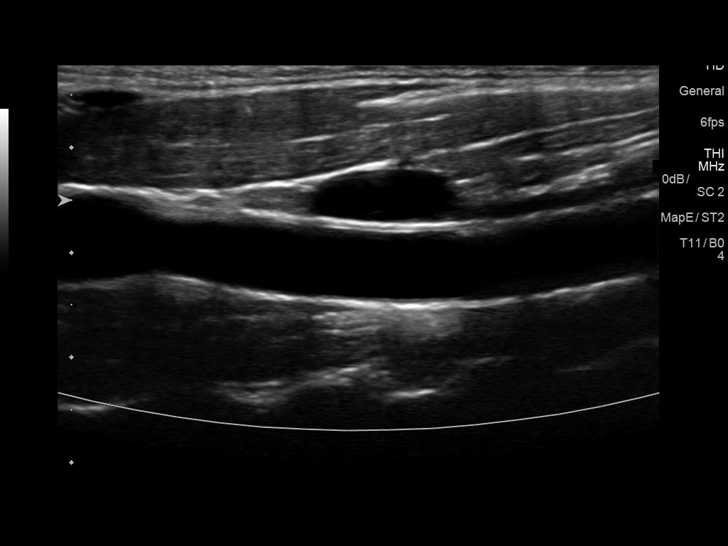

[14 of 25 positions shown; findings below may reference images not displayed]

FINDINGS: Right posterior regular lymph node 7 mm, and 8 mm in short axis
dimension. These are nontender.

Similar lymph nodes in the left neck measuring 8 mm, and 7 mm.
IMPRESSION: Non pathologically enlarged lymph nodes in the posterior auricular
region bilaterally. Probable reactive lymph nodes.

## 2019-09-11 ENCOUNTER — Encounter: Payer: Self-pay | Admitting: Family Medicine

## 2019-09-11 ENCOUNTER — Ambulatory Visit (INDEPENDENT_AMBULATORY_CARE_PROVIDER_SITE_OTHER): Payer: 59 | Admitting: Family Medicine

## 2019-09-11 ENCOUNTER — Other Ambulatory Visit: Payer: Self-pay

## 2019-09-11 VITALS — BP 116/73 | HR 87 | Temp 98.3°F | Ht 65.0 in | Wt 175.2 lb

## 2019-09-11 DIAGNOSIS — R569 Unspecified convulsions: Secondary | ICD-10-CM

## 2019-09-11 DIAGNOSIS — G40009 Localization-related (focal) (partial) idiopathic epilepsy and epileptic syndromes with seizures of localized onset, not intractable, without status epilepticus: Secondary | ICD-10-CM

## 2019-09-11 DIAGNOSIS — G40A09 Absence epileptic syndrome, not intractable, without status epilepticus: Secondary | ICD-10-CM

## 2019-09-11 MED ORDER — TOPIRAMATE 100 MG PO TABS
ORAL_TABLET | ORAL | 3 refills | Status: DC
Start: 2019-09-11 — End: 2020-03-12

## 2019-09-11 MED ORDER — LACOSAMIDE 50 MG PO TABS: ORAL_TABLET | ORAL | 1 refills | Status: DC

## 2019-09-11 NOTE — Progress Notes (Signed)
PATIENT: Jennifer Luna DOB: Oct 02, 1990  REASON FOR VISIT: follow up HISTORY FROM: patient  Chief Complaint  Patient presents with  . Follow-up    Rm 2, alone.  No seizures  . Seizures     HISTORY OF PRESENT ILLNESS: Today 09/11/19 Jennifer Luna is a 29 y.o. female here today for follow up for epilepsy. She continues Vimpat 50mg  in am and 100mg  in pm as well as topiramate 50mg  in am and 100mg  in pm. She is taking clonazepam 0.25mg  at bedtime. She is doing well. She is driving without difficulty. She is working from home, part time. She has had both COVID vaccines. She denies side effects with vaccine. She has follow up with GYN in June.   HISTORY: (copied from Jennifer Luna's note on 03/12/2019)  Interval history for Jennifer Luna, a patient I have followed since 2011. Jennifer Luna is currently taking Vimpat 100 mg by mouth twice a day, topiramate 100 mg twice a day and Klonopin at bedtime. She continues to be well controlled. Her last seizure occurred in 2013. 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 She will loose health insurance through her parents in October, wants her refills on 90 day bases now. CMET ordered.   11-05-2015,Jennifer Luna is a 29 year old female with a history of seizures. She continues to take Vimpat, Topamax and clonazepam. She reports that she is not had any seizures since the last visit. She is able to operate a motor vehicle without difficulty. She is able to complete all ADLs independently. She states that her seizures typically consist of staring spells that last for several seconds. She denies any changes in her mood or behavior. She does state that she's recently been very tired. She states that she works 4 hours a day and is also in November. She states that lately she has been very busy. She denies any changes with her walking or balance. However her mother speaks  up and states that she is very stiff for a 29 year old.  She states that when she is gets out of bed in the morning she does feel stiff in her joints specifically the hips and knees. She states that when she gets going this improves.  The mother states that she has fibromyalgia and is wondering if some of the symptoms that her daughter has maybe related to fibromyalgia as well. She returns today to obtain refills for her antiepileptic medication. She also described recently a spell where she had some myoclonic jerking in the right elbow and right knee, she was out in public and left the event concerned that this may proceed a seizure but this did not happen. Her last recorded seizures from 2013. Jennifer Luna has been driving, she is currently in summer break from school.  HISTORY 10/29/14: Jennifer Luna is a 29 y.o. female Is seen here as a revisit from Jennifer. 03-11-1976. She has been one year seizure free and driving. She still has occasional little absence "blurps" but she has not had the loss of conversational flow and the secondary generalization. She had onset of seizures at age 44 . Her mother described the toddler coming up to her and sudden eye flutter and staring off, unresponsive.   Her last seizure in 2013 , April.  Driving to work, has 2 part time jobs. She has developed well, has become much more independent and can drive.   She's taking 2  Vimpat 100 mg by mouth twice a day, she's taking Topiramate 100 mg bid po.  She's taking Klonopin or 0.5 mg a half tablet by mouth at bedtime.  She has been informed about a VNS treatment option, she doesn't want now.    REVIEW OF SYSTEMS: Out of a complete 14 system review of symptoms, the patient complains only of the following symptoms, seizure and all other reviewed systems are negative.  ALLERGIES: Allergies  Allergen Reactions  . Penicillins Rash    Has patient had a PCN reaction causing immediate rash, facial/tongue/throat swelling, SOB or  lightheadedness with hypotension: No Has patient had a PCN reaction causing severe rash involving mucus membranes or skin necrosis: Yes Has patient had a PCN reaction that required hospitalization No Has patient had a PCN reaction occurring within the last 10 years: No If all of the above answers are "NO", then may proceed with Cephalosporin use.   . Sulfa Antibiotics Rash  . Erythromycin Rash    HOME MEDICATIONS: Outpatient Medications Prior to Visit  Medication Sig Dispense Refill  . Ascorbic Acid (VITAMIN C PO) Take 1 tablet by mouth daily.    . clonazePAM (KLONOPIN) 0.5 MG tablet Take 0.5 tablets (0.25 mg total) by mouth at bedtime. 30 tablet 5  . drospirenone-ethinyl estradiol (YAZ,GIANVI,LORYNA) 3-0.02 MG tablet Take 1 tablet by mouth daily.   12  . folic acid (FOLVITE) 1 MG tablet Take 1 mg by mouth daily.    Marland Kitchen ibuprofen (ADVIL,MOTRIN) 600 MG tablet Take 1 tablet (600 mg total) by mouth every 6 (six) hours as needed. 30 tablet 0  . ketoconazole (NIZORAL) 2 % shampoo Apply 1 application topically 2 (two) times a week. 120 mL 0  . Multiple Vitamin (MULTIVITAMIN) tablet Take 1 tablet by mouth daily.    . Multiple Vitamins-Minerals (HAIR SKIN AND NAILS FORMULA PO) Take by mouth daily.    . simethicone (INFANTS SIMETHICONE) 40 MG/0.6ML drops Take 0.6 mLs (40 mg total) by mouth 4 (four) times daily as needed. 30 mL 0  . zonisamide (ZONEGRAN) 25 MG capsule Take 2 capsules (50 mg total) by mouth at bedtime. 180 capsule 3  . lacosamide (VIMPAT) 50 MG TABS tablet TAKE 1 TABLET BY MOUTH ONCE DAILY IN THE MORNING AND 2 AT BEDTIME 270 tablet 3  . topiramate (TOPAMAX) 100 MG tablet 50 mg(0.5 tablets)  in AM and 100 mg (1 whole tablet) in PM by mouth. 270 tablet 3   No facility-administered medications prior to visit.    PAST MEDICAL HISTORY: Past Medical History:  Diagnosis Date  . Amenorrhea 01/11/2012   notes from office visit, PERIMENTRUAL PAINS  . Arthralgia 04/23/2013  . Epilepsy  ([place])    with absence seizures, abnormal EEG  . Lump in neck 08/2017  . Seizures ([place])    nx of absent and grand mal  . Shortness of breath 04/23/2013  . Sleepiness 01/11/2012   notes from office visit  . Sleepiness    DAYTIME  . Thrombocytopenia ([place]) 01/11/12   office notes from last visit, with zarontin and depakote    PAST SURGICAL HISTORY: Past Surgical History:  Procedure Laterality Date  . shoulder injury      FAMILY HISTORY: Family History  Problem Relation Age of Onset  . Hypothyroidism Mother   . Arthritis Mother   . Hypertension Father   . Diabetes Father   . Hyperlipidemia Father   . Breast cancer Maternal Grandmother   . Heart disease Maternal Grandmother   .  Hypertension Maternal Grandmother   . Heart disease Maternal Grandfather   . Heart disease Paternal Grandmother   . Rheum arthritis Paternal Grandmother   . Asthma Sister   . Lupus Maternal Aunt   . Seizures Paternal Uncle        as a child  . Rheum arthritis Maternal Aunt   . Asthma Other        Maternal Great Uncle   . Asthma Sister        had as a child    SOCIAL HISTORY: Social History   Socioeconomic History  . Marital status: Single    Spouse name: Not on file  . Number of children: 0  . Years of education: 73  . Highest education level: Not on file  Occupational History  . Occupation: Research officer, political party: UNEMPLOYED  . Occupation: Dentist: UNEMPLOYED  Tobacco Use  . Smoking status: Never Smoker  . Smokeless tobacco: Never Used  Substance and Sexual Activity  . Alcohol use: No  . Drug use: No  . Sexual activity: Not on file  Other Topics Concern  . Not on file  Social History Narrative   Patient is single and lives at home with her parents.   Patient is working part-time.   Patient has a high school education.   Patient is right-handed.   Patient drinks about 1-2 cups of hot tea daily   Social Determinants of Health   Financial Resource Strain:    . Difficulty of Paying Living Expenses:   Food Insecurity:   . Worried About Programme researcher, broadcasting/film/video in the Last Year:   . Barista in the Last Year:   Transportation Needs:   . Freight forwarder (Medical):   Marland Kitchen Lack of Transportation (Non-Medical):   Physical Activity:   . Days of Exercise per Week:   . Minutes of Exercise per Session:   Stress:   . Feeling of Stress :   Social Connections:   . Frequency of Communication with Friends and Family:   . Frequency of Social Gatherings with Friends and Family:   . Attends Religious Services:   . Active Member of Clubs or Organizations:   . Attends Banker Meetings:   Marland Kitchen Marital Status:   Intimate Partner Violence:   . Fear of Current or Ex-Partner:   . Emotionally Abused:   Marland Kitchen Physically Abused:   . Sexually Abused:       PHYSICAL EXAM  Vitals:   09/11/19 1052  BP: 116/73  Pulse: 87  Temp: 98.3 F (36.8 C)  TempSrc: Oral  Weight: 175 lb 3.2 oz (79.5 kg)  Height: 5\' 5"  (1.651 m)   Body mass index is 29.15 kg/m.  Generalized: Well developed, in no acute distress  Cardiology: normal rate and rhythm, no murmur noted Respiratory: clear to auscultation bilaterally  Neurological examination  Mentation: Alert oriented to time, place, history taking. Follows all commands speech and language fluent Cranial nerve II-XII: Pupils were equal round reactive to light. Extraocular movements were full Motor: The motor testing reveals 5 over 5 strength of all 4 extremities. Good symmetric motor tone is noted throughout.  Sensory: Sensory testing is intact to soft touch on all 4 extremities. No evidence of extinction is noted.  Gait and station: Gait is normal.   DIAGNOSTIC DATA (LABS, IMAGING, TESTING) - I reviewed patient records, labs, notes, testing and imaging myself where available.  No flowsheet data found.  Lab Results  Component Value Date   WBC 4.0 03/12/2019   HGB 13.9 03/12/2019   HCT 42.1  03/12/2019   MCV 89 03/12/2019   PLT 202 03/12/2019      Component Value Date/Time   NA 137 03/12/2019 1127   K 4.5 03/12/2019 1127   CL 102 03/12/2019 1127   CO2 23 03/12/2019 1127   GLUCOSE 93 03/12/2019 1127   GLUCOSE 102 (H) 12/14/2017 1614   BUN 9 03/12/2019 1127   CREATININE 0.69 03/12/2019 1127   CREATININE 0.74 04/24/2013 1605   CALCIUM 9.6 03/12/2019 1127   PROT 7.1 03/12/2019 1127   ALBUMIN 4.8 03/12/2019 1127   AST 16 03/12/2019 1127   ALT 16 03/12/2019 1127   ALKPHOS 60 03/12/2019 1127   BILITOT 0.4 03/12/2019 1127   GFRNONAA 119 03/12/2019 1127   GFRAA 137 03/12/2019 1127   No results found for: CHOL, HDL, LDLCALC, LDLDIRECT, TRIG, CHOLHDL No results found for: [id number] Lab Results  Component Value Date   VITAMINB12 911 04/24/2013   Lab Results  Component Value Date   TSH 0.272 (L) 04/15/2013     ASSESSMENT AND PLAN 29 y.o. year old female  has a past medical history of Amenorrhea (01/11/2012), Arthralgia (04/23/2013), Epilepsy (HCC), Lump in neck (08/2017), Seizures (HCC), Shortness of breath (04/23/2013), Sleepiness (01/11/2012), Sleepiness, and Thrombocytopenia (HCC) (01/11/12). here with     ICD-10-CM   1. Localization-related (focal) (partial) idiopathic epilepsy and epileptic syndromes with seizures of localized onset, not intractable, without status epilepticus (HCC)  G40.009   2. Absence epileptic syndrome (HCC)  G40.A09   3. Seizures (HCC)  R56.9 topiramate (TOPAMAX) 100 MG tablet    Nadyne is doing well, today. She continues Vimpat, topiramate and clonazepam as prescribed and is tolerating well. PMP aware and refills are appropriate. She continues to be independent and driving without difficult. We have discussed updating labs. She had normal CBC and CHEM in 02/2019. She requests to have labs updated with her CPE in 10/2019. She will have results sent to our office. She will stay well hydrated. Focus on healthy lifestyle habits. She will follow up with  PCP and GYN as advised. Follow up with [place] in 6 months. She verbalizes understanding and agreement with this plan.    No orders of the defined types were placed in this encounter.    Meds ordered this encounter  Medications  . topiramate (TOPAMAX) 100 MG tablet    Sig: 50 mg(0.5 tablets)  in AM and 100 mg (1 whole tablet) in PM by mouth.    Dispense:  270 tablet    Refill:  3    Order Specific Question:   Supervising Provider    Answer:   Anson Fret [id number]  . lacosamide (VIMPAT) 50 MG TABS tablet    Sig: TAKE 1 TABLET BY MOUTH ONCE DAILY IN THE MORNING AND 2 AT BEDTIME    Dispense:  270 tablet    Refill:  1    Order Specific Question:   Supervising Provider    Answer:   Anson Fret [id number]      I spent 15 minutes with the patient. 50% of this time was spent counseling and educating patient on plan of care and medications.    Shawnie Dapper, FNP-C 09/11/2019, 11:33 AM [facility] [address] [place], [place] [address] [phone]

## 2019-09-11 NOTE — Patient Instructions (Addendum)
Continue Vimpat, topiramate and clonazepam as prescribed.   Follow up in 6 months   Seizure, Adult A seizure is a sudden burst of abnormal electrical activity in the brain. Seizures usually last from 30 seconds to 2 minutes. They can cause many different symptoms. Usually, seizures are not harmful unless they last a long time. What are the causes? Common causes of this condition include:  Fever or infection.  Conditions that affect the brain, such as: ? A brain abnormality that you were born with. ? A brain or head injury. ? Bleeding in the brain. ? A tumor. ? Stroke. ? Brain disorders such as autism or cerebral palsy.  Low blood sugar.  Conditions that are passed from parent to child (are inherited).  Problems with substances, such as: ? Having a reaction to a drug or a medicine. ? Suddenly stopping the use of a substance (withdrawal). In some cases, the cause may not be known. A person who has repeated seizures over time without a clear cause has a condition called epilepsy. What increases the risk? You are more likely to get this condition if you have:  A family history of epilepsy.  Had a seizure in the past.  A brain disorder.  A history of head injury, lack of oxygen at birth, or strokes. What are the signs or symptoms? There are many types of seizures. The symptoms vary depending on the type of seizure you have. Examples of symptoms during a seizure include:  Shaking (convulsions).  Stiffness in the body.  Passing out (losing consciousness).  Head nodding.  Staring.  Not responding to sound or touch.  Loss of bladder control and bowel control. Some people have symptoms right before and right after a seizure happens. Symptoms before a seizure may include:  Fear.  Worry (anxiety).  Feeling like you may vomit (nauseous).  Feeling like the room is spinning (vertigo).  Feeling like you saw or heard something before (dj vu).  Odd tastes or  smells.  Changes in how you see. You may see flashing lights or spots. Symptoms after a seizure happens can include:  Confusion.  Sleepiness.  Headache.  Weakness on one side of the body. How is this treated? Most seizures will stop on their own in under 5 minutes. In these cases, no treatment is needed. Seizures that last longer than 5 minutes will usually need treatment. Treatment can include:  Medicines given through an IV tube.  Avoiding things that are known to cause your seizures. These can include medicines that you take for another condition.  Medicines to treat epilepsy.  Surgery to stop the seizures. This may be needed if medicines do not help. Follow these instructions at home: Medicines  Take over-the-counter and prescription medicines only as told by your doctor.  Do not eat or drink anything that may keep your medicine from working, such as alcohol. Activity  Do not do any activities that would be dangerous if you had another seizure, like driving or swimming. Wait until your doctor says it is safe for you to do them.  If you live in the [place] ask your local DMV (department of motor vehicles) when you can drive.  Get plenty of rest. Teaching others Teach friends and family what to do when you have a seizure. They should:  Lay you on the ground.  Protect your head and body.  Loosen any tight clothing around your neck.  Turn you on your side.  Not hold you down.  Not  put anything into your mouth.  Know whether or not you need emergency care.  Stay with you until you are better.  General instructions  Contact your doctor each time you have a seizure.  Avoid anything that gives you seizures.  Keep a seizure diary. Write down: ? What you think caused each seizure. ? What you remember about each seizure.  Keep all follow-up visits as told by your doctor. This is important. Contact a doctor if:  You have another seizure.  You have seizures  more often.  There is any change in what happens during your seizures.  You keep having seizures with treatment.  You have symptoms of being sick or having an infection. Get help right away if:  You have a seizure that: ? Lasts longer than 5 minutes. ? Is different than seizures you had before. ? Makes it harder to breathe. ? Happens after you hurt your head.  You have any of these symptoms after a seizure: ? Not being able to speak. ? Not being able to use a part of your body. ? Confusion. ? A bad headache.  You have two or more seizures in a row.  You do not wake up right after a seizure.  You get hurt during a seizure. These symptoms may be an emergency. Do not wait to see if the symptoms will go away. Get medical help right away. Call your local emergency services (911 in the [place] Do not drive yourself to the hospital. Summary  Seizures usually last from 30 seconds to 2 minutes. Usually, they are not harmful unless they last a long time.  Do not eat or drink anything that may keep your medicine from working, such as alcohol.  Teach friends and family what to do when you have a seizure.  Contact your doctor each time you have a seizure. This information is not intended to replace advice given to you by your health care provider. Make sure you discuss any questions you have with your health care provider. Document Revised: 07/20/2018 Document Reviewed: 07/20/2018 Elsevier Patient Education  [place].

## 2019-09-12 ENCOUNTER — Telehealth: Payer: Self-pay

## 2019-09-12 NOTE — Telephone Encounter (Signed)
I received approval for vimpat [employer] [phone], fax [phone]. 09-12-19 thru 09-11-2020.  Fax confirmation [facility] [phone].

## 2019-09-12 NOTE — Telephone Encounter (Signed)
Received PA request for vimpat from [facility]. Completed via covermymeds. Key: [id number]. Sent to [employer]. Should have a determination in 3-5 business days.

## 2020-02-02 ENCOUNTER — Other Ambulatory Visit: Payer: Self-pay | Admitting: Neurology

## 2020-03-12 ENCOUNTER — Ambulatory Visit (INDEPENDENT_AMBULATORY_CARE_PROVIDER_SITE_OTHER): Payer: 59 | Admitting: Neurology

## 2020-03-12 ENCOUNTER — Encounter: Payer: Self-pay | Admitting: Neurology

## 2020-03-12 VITALS — BP 121/82 | HR 95 | Ht 65.0 in | Wt 173.0 lb

## 2020-03-12 DIAGNOSIS — G40A09 Absence epileptic syndrome, not intractable, without status epilepticus: Secondary | ICD-10-CM | POA: Diagnosis not present

## 2020-03-12 DIAGNOSIS — G40309 Generalized idiopathic epilepsy and epileptic syndromes, not intractable, without status epilepticus: Secondary | ICD-10-CM

## 2020-03-12 DIAGNOSIS — F5102 Adjustment insomnia: Secondary | ICD-10-CM | POA: Diagnosis not present

## 2020-03-12 DIAGNOSIS — R569 Unspecified convulsions: Secondary | ICD-10-CM | POA: Diagnosis not present

## 2020-03-12 MED ORDER — ZONISAMIDE 25 MG PO CAPS: 50.0000 mg | ORAL_CAPSULE | Freq: Every day | ORAL | 3 refills | Status: DC

## 2020-03-12 MED ORDER — TOPIRAMATE 100 MG PO TABS: ORAL_TABLET | ORAL | 3 refills | Status: DC

## 2020-03-12 MED ORDER — LACOSAMIDE 50 MG PO TABS: ORAL_TABLET | ORAL | 1 refills | Status: DC

## 2020-03-12 NOTE — Progress Notes (Addendum)
PATIENT: Jennifer Luna DOB: 09-Oct-1990  REASON FOR VISIT: follow up- seizures HISTORY FROM: patient    I am seeing [name] on 03/12/20 at 10:30 AM EDT ,She has not had breakthrough seizures , she just needs refills.  Her mother has not noted any breakthrough activity. No staring attacks, no tremors.  Patient is fully covid vaccinated , gets her booster next week. She has been waking up each night between 2 and 4 AM, and may take 60 minutes to go back to sleep- she advanced her bedtime a little- bedtime now at 9 PM.  Some nights the arousal is due to nocturia, some nights its not. She changed form prone sleep to supine - wakes up on her back with a dry mouth.  No headaches.  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  HISTORY OF PRESENT ILLNESS:  Interval history for [facility], a patient I have followed since 2011. Jennifer Luna is currently taking Vimpat 100 mg by mouth twice a day, topiramate 100 mg twice a day and Klonopin at bedtime. She continues to be well controlled. Her last seizure occurred in 2013. 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 She will loose health insurance through her parents in October, wants her refills on 90 day bases now. CMET ordered.   11-05-2015,[name] is a 29 year old female with a history of seizures. She continues to take Vimpat, Topamax and clonazepam. She reports that she is not had any seizures since the last visit. She is able to operate a motor vehicle without difficulty. She is able to complete all ADLs independently. She states that her seizures typically consist of staring spells that last for several seconds. She denies any changes in her mood or behavior. She does state that she's recently been very tired. She states that she works 4 hours a day and is also in [employer]. She states that lately she has been very busy. She denies any changes with her walking or balance. However her mother speaks up and states that she is very stiff for a 29 year old.  She states that when she is gets out of bed in the morning she does feel stiff in her joints specifically the hips and knees. She states that when she gets going this improves.  The mother states that she has fibromyalgia and is wondering if some of the symptoms that her daughter has maybe related to fibromyalgia as well. She returns today to obtain refills for her antiepileptic medication. She also described recently a spell where she had some myoclonic jerking in the right elbow and right knee, she was out in public and left the event concerned that this may proceed a seizure but this did not happen. Her last recorded seizures from 2013. [place] has been driving, she is currently in summer break from school.  HISTORY 10/29/14: Jennifer Luna is a 29 y.o. female Is seen here as a revisit from Dr. Cleta Alberts. She has been one year seizure free and driving. She still has occasional little absence "blurps" but she has not had the loss of conversational flow and the secondary generalization. She had onset of seizures at age 89 . Her mother described the toddler coming up to her and sudden eye flutter and staring off, unresponsive.   Her last seizure in  2013 , April.  Driving to work, has 2 part time jobs. She has developed well, has become much more independent and can drive.   She's taking 2 Vimpat 100 mg by mouth twice a day, she's taking Topiramate 100 mg bid po.  She's taking Klonopin or 0.5 mg a half tablet by mouth at bedtime.  She has been informed about a VNS treatment option, she doesn't want now.    REVIEW OF SYSTEMS: Out of a complete 14 system review of symptoms, the patient complains only of the following symptoms, and all other reviewed systems are negative.  Improved Fatigue, which I though had  been Vimpat related.  Later bed time now during [place] stay at home. Lost her job, sadly.  Next week starting a new full time job.  Home exercise.    ALLERGIES: Allergies  Allergen Reactions  . Penicillins Rash    Has patient had a PCN reaction causing immediate rash, facial/tongue/throat swelling, SOB or lightheadedness with hypotension: No Has patient had a PCN reaction causing severe rash involving mucus membranes or skin necrosis: Yes Has patient had a PCN reaction that required hospitalization No Has patient had a PCN reaction occurring within the last 10 years: No If all of the above answers are "NO", then may proceed with Cephalosporin use.   . Sulfa Antibiotics Rash  . Erythromycin Rash    HOME MEDICATIONS: Outpatient Medications Prior to Visit  Medication Sig Dispense Refill  . clonazePAM (KLONOPIN) 0.5 MG tablet TAKE 1/2 (ONE-HALF) TABLET BY MOUTH AT BEDTIME 30 tablet 0  . Ascorbic Acid (VITAMIN C PO) Take 1 tablet by mouth daily.    . drospirenone-ethinyl estradiol (YAZ,GIANVI,LORYNA) 3-0.02 MG tablet Take 1 tablet by mouth daily.   12  . folic acid (FOLVITE) 1 MG tablet Take 1 mg by mouth daily.    Marland Kitchen. ibuprofen (ADVIL,MOTRIN) 600 MG tablet Take 1 tablet (600 mg total) by mouth every 6 (six) hours as needed. 30 tablet 0  . ketoconazole (NIZORAL) 2 % shampoo Apply 1 application topically 2 (two) times a week. 120 mL 0  . lacosamide (VIMPAT) 50 MG TABS tablet TAKE 1 TABLET BY MOUTH ONCE DAILY IN THE MORNING AND 2 AT BEDTIME 270 tablet 1  . Multiple Vitamin (MULTIVITAMIN) tablet Take 1 tablet by mouth daily.    . Multiple Vitamins-Minerals (HAIR SKIN AND NAILS FORMULA PO) Take by mouth daily.    . simethicone (INFANTS SIMETHICONE) 40 MG/0.6ML drops Take 0.6 mLs (40 mg total) by mouth 4 (four) times daily as needed. 30 mL 0  . topiramate (TOPAMAX) 100 MG tablet 50 mg(0.5 tablets)  in AM and 100 mg (1 whole tablet) in PM by mouth. 270 tablet 3  . zonisamide (ZONEGRAN) 25 MG  capsule Take 2 capsules (50 mg total) by mouth at bedtime. 180 capsule 3   No facility-administered medications prior to visit.    PAST MEDICAL HISTORY: Past Medical History:  Diagnosis Date  . Amenorrhea 01/11/2012   notes from office visit, PERIMENTRUAL PAINS  . Arthralgia 04/23/2013  . Epilepsy (HCC)    with absence seizures, abnormal EEG  . Lump in neck 08/2017  . Seizures (HCC)    nx of absent and grand mal  . Shortness of breath 04/23/2013  . Sleepiness 01/11/2012   notes from office visit  . Sleepiness    DAYTIME  . Thrombocytopenia (HCC) 01/11/12   office notes from last visit, with zarontin and depakote    PAST SURGICAL HISTORY: Past Surgical  History:  Procedure Laterality Date  . shoulder injury      FAMILY HISTORY: Family History  Problem Relation Age of Onset  . Hypothyroidism Mother   . Arthritis Mother   . Hypertension Father   . Diabetes Father   . Hyperlipidemia Father   . Breast cancer Maternal Grandmother   . Heart disease Maternal Grandmother   . Hypertension Maternal Grandmother   . Heart disease Maternal Grandfather   . Heart disease Paternal Grandmother   . Rheum arthritis Paternal Grandmother   . Asthma Sister   . Lupus Maternal Aunt   . Seizures Paternal Uncle        as a child  . Rheum arthritis Maternal Aunt   . Asthma Other        Maternal Great Uncle   . Asthma Sister        had as a child    SOCIAL HISTORY: Social History   Socioeconomic History  . Marital status: Single    Spouse name: Not on file  . Number of children: 0  . Years of education: 18  . Highest education level: Not on file  Occupational History  . Occupation: Research officer, political party: UNEMPLOYED  . Occupation: Dentist: UNEMPLOYED  Tobacco Use  . Smoking status: Never Smoker  . Smokeless tobacco: Never Used  Vaping Use  . Vaping Use: Never used  Substance and Sexual Activity  . Alcohol use: No  . Drug use: No  . Sexual activity: Not  on file  Other Topics Concern  . Not on file  Social History Narrative   Patient is single and lives at home with her parents.   Patient is working part-time.   Patient has a high school education.   Patient is right-handed.   Patient drinks about 1-2 cups of hot tea daily   Social Determinants of Health   Financial Resource Strain:   . Difficulty of Paying Living Expenses: Not on file  Food Insecurity:   . Worried About Programme researcher, broadcasting/film/video in the Last Year: Not on file  . Ran Out of Food in the Last Year: Not on file  Transportation Needs:   . Lack of Transportation (Medical): Not on file  . Lack of Transportation (Non-Medical): Not on file  Physical Activity:   . Days of Exercise per Week: Not on file  . Minutes of Exercise per Session: Not on file  Stress:   . Feeling of Stress : Not on file  Social Connections:   . Frequency of Communication with Friends and Family: Not on file  . Frequency of Social Gatherings with Friends and Family: Not on file  . Attends Religious Services: Not on file  . Active Member of Clubs or Organizations: Not on file  . Attends Banker Meetings: Not on file  . Marital Status: Not on file  Intimate Partner Violence:   . Fear of Current or Ex-Partner: Not on file  . Emotionally Abused: Not on file  . Physically Abused: Not on file  . Sexually Abused: Not on file      Observation:  There were no vitals filed for this visit.  Generalized: Well developed 29 year old afro-american right handed female, well groomed and cooperative, pleasant- in no acute distress   Neurological examination  Mentation: Alert oriented to time, place, history taking. Follows all commands speech and language fluent Cranial nerve :  No change in taste or smell- fully vaccinated.  Pupils were equal round reactive to light. Extraocular movements were full,.   Facial strength  normal. No sensory loss.  Uvula and tongue midline, no tongue bites.  Head  turning and shoulder shrug  were normal and symmetric.  Motor tone and bulk and strength is symmetrical. No tremor, no ataxia.  Last GTC seizure cause injury to the right shoulder. No ROM restriction but discomfort with rotation.    Her gait intact, sensory intact. DTR symmetric 1 plus.   DIAGNOSTIC DATA (LABS, IMAGING, TESTING) - I reviewed patient records, labs, notes, testing and imaging myself where available. CMET q 6 month.    Labs to be drawn today. HST ordered   ASSESSMENT AND PLAN -  15 minute routine revisit for refills and medication management.   29 y.o. year old afro-american right handed, single  female patient with a medical history of Arthralgia (04/23/2013), Epilepsy (HCC)- absence , generalized/  Shortness of breath (04/23/2013), Sleepiness (01/11/2012), Sleepiness, and Thrombocytopenia (HCC) (01/11/12).   Here with: last seizure recorded 2013, she is now working and is driving- She had in 1610 myoclonic jerks preceding a seizure, now reports still occassional myoclonic jerks.    1. 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 Her diagnosis is EPILEPSY, not just "seizures".   2. She takes birth control pills to control menstrual cramping, not for birth control. She knows that the birth control effect can be reduced by taking antiepileptic medication. She has now regular menstrual periods.   3. New onset insomnia, long periods of wakefulness in the middle of the night. Weight gain and changing sleep habits, supine sleep position. No history of seizures in her sleep;    Labs were done by [facility]- Marcelle Overlie, MD.   Patient will continue on Vimpat, Topamax and clonazepam.  Patient advised that she has any seizure she should let us know.  HST ordered for this calendar year.  Rv in 5 month with NP.     Melvyn Novas, MD   03/12/2020, 10:25 AM [facility] [address], [address] [place], [place]  [address] [phone]

## 2020-03-12 NOTE — Patient Instructions (Signed)

## 2020-04-02 ENCOUNTER — Telehealth: Payer: Self-pay

## 2020-04-02 NOTE — Telephone Encounter (Signed)
LVM for pt to call me back to schedule sleep study  

## 2020-04-22 ENCOUNTER — Other Ambulatory Visit: Payer: Self-pay

## 2020-04-22 ENCOUNTER — Ambulatory Visit (INDEPENDENT_AMBULATORY_CARE_PROVIDER_SITE_OTHER): Payer: 59 | Admitting: Neurology

## 2020-04-22 DIAGNOSIS — F5102 Adjustment insomnia: Secondary | ICD-10-CM

## 2020-04-22 DIAGNOSIS — G4733 Obstructive sleep apnea (adult) (pediatric): Secondary | ICD-10-CM

## 2020-04-22 DIAGNOSIS — G40A09 Absence epileptic syndrome, not intractable, without status epilepticus: Secondary | ICD-10-CM

## 2020-04-22 DIAGNOSIS — G40309 Generalized idiopathic epilepsy and epileptic syndromes, not intractable, without status epilepticus: Secondary | ICD-10-CM

## 2020-04-28 NOTE — Progress Notes (Signed)
   [facility]  HOME SLEEP TEST (Watch PAT)  STUDY DATA -DATE: 04/28/20  DOB: 16-Jul-1990  MRN: [id number]  ORDERING CLINICIAN: Melvyn Novas, MD   REFERRING CLINICIAN: Doristine Bosworth, MD  CLINICAL INFORMATION/HISTORY: [name] a 29 y.o. year old right handed female patient with a medical history of Arthralgia (04/23/2013), Epilepsy (HCC)- absence, generalized/  Shortness of breath (04/23/2013), Sleepiness (01/11/2012), Sleepiness, and Thrombocytopenia (HCC) (01/11/12).Her mother has not noted any breakthrough activity. No staring attacks, no tremors.question if medications have affected sleep pattern, sleep continuity.    She has been waking up each night between 2 and 4 AM and may take 60 minutes to go back to sleep- she advanced her bedtime a little- bedtime now at 9 PM. Some nights the arousal is due to nocturia, some nights it's not. She changed from prone sleep to supine - wakes up on her back with a dry mouth.  No headaches. Patient is fully covid vaccinated, gets her booster next week.   Epworth sleepiness score: N/A.  BMI: 29.0 kg/m  FINDINGS:   Total Record Time (hours, min): 9 h 25 min  Total Sleep Time (hours, min):  8 h 7 min   Percent REM (%):    24.78 %   Calculated pAHI (per hour):  1.5       REM pAHI:    1.0     NREM pAHI: 1.7 Supine AHI: 2.2   Oxygen Saturation (%) Mean: 94  Minimum oxygen saturation (%):        91   O2 Saturation Range (%): 91-97  O2Saturation (minutes) <=88%: 0 min   Pulse Mean (bpm):    74  Pulse Range (49-111)   IMPRESSION: This HST did not find any evidence of OSA (obstructive sleep apnea) and only snoring was present. The snoring volume was moderate.    RECOMMENDATION:  No intervention is needed. If the patient wishes to address snoring, a dental device can be used to reduce or alleviate this.    INTERPRETING PHYSICIAN:  Melvyn Novas, MD [facility] [address] [place], [place] [address] [phone]

## 2020-04-30 NOTE — Procedures (Signed)
[facility]  HOME SLEEP TEST (Watch PAT)  STUDY DATA -DATE: 04/28/20  DOB: 11/11/1990  MRN: [id number]  ORDERING CLINICIAN: Melvyn Novas, MD   REFERRING CLINICIAN: Doristine Bosworth, MD  CLINICAL INFORMATION/HISTORY: Ms. LAQUISHA NORTHCRAFT a 29 y.o. year old right handed female patient with a medical history of Arthralgia (04/23/2013), Epilepsy (HCC)- absence, generalized/  Shortness of breath (04/23/2013), Sleepiness (01/11/2012), Sleepiness, and Thrombocytopenia (HCC) (01/11/12).Her mother has not noted any breakthrough activity. No staring attacks, no tremors.question if medications have affected sleep pattern, sleep continuity.    She has been waking up each night between 2 and 4 AM and may take 60 minutes to go back to sleep- she advanced her bedtime a little- bedtime now at 9 PM. Some nights the arousal is due to nocturia, some nights it's not. She changed from prone sleep to supine - wakes up on her back with a dry mouth.  No headaches. Patient is fully covid vaccinated, gets her booster next week.   Epworth sleepiness score: N/A.  BMI: 29.0 kg/m  FINDINGS:   Total Record Time (hours, min): 9 h 25 min  Total Sleep Time (hours, min):  8 h 7 min   Percent REM (%):    24.78 %   Calculated pAHI (per hour):  1.5       REM pAHI:    1.0     NREM pAHI: 1.7 Supine AHI: 2.2   Oxygen Saturation (%) Mean: 94  Minimum oxygen saturation (%):        91   O2 Saturation Range (%): 91-97  O2Saturation (minutes) <=88%: 0 min   Pulse Mean (bpm):    74  Pulse Range (49-111)   IMPRESSION: This HST did not find any evidence of OSA (obstructive sleep apnea) and only snoring was present. The snoring volume was moderate.    RECOMMENDATION:  No intervention is needed. If the patient wishes to address snoring, a dental device can be used to reduce or alleviate this.    INTERPRETING PHYSICIAN:  Melvyn Novas, MD

## 2020-05-04 ENCOUNTER — Encounter: Payer: Self-pay | Admitting: Neurology

## 2020-05-12 ENCOUNTER — Ambulatory Visit (INDEPENDENT_AMBULATORY_CARE_PROVIDER_SITE_OTHER): Payer: 59 | Admitting: Family Medicine

## 2020-05-12 ENCOUNTER — Encounter: Payer: Self-pay | Admitting: Family Medicine

## 2020-05-12 ENCOUNTER — Other Ambulatory Visit: Payer: Self-pay

## 2020-05-12 VITALS — BP 117/74 | HR 87 | Temp 98.9°F | Wt 173.0 lb

## 2020-05-12 DIAGNOSIS — Z1329 Encounter for screening for other suspected endocrine disorder: Secondary | ICD-10-CM

## 2020-05-12 DIAGNOSIS — H6123 Impacted cerumen, bilateral: Secondary | ICD-10-CM

## 2020-05-12 DIAGNOSIS — Z5320 Procedure and treatment not carried out because of patient's decision for unspecified reasons: Secondary | ICD-10-CM

## 2020-05-12 DIAGNOSIS — Z23 Encounter for immunization: Secondary | ICD-10-CM

## 2020-05-12 DIAGNOSIS — Z833 Family history of diabetes mellitus: Secondary | ICD-10-CM

## 2020-05-12 DIAGNOSIS — Z130 Encounter for screening for diseases of the blood and blood-forming organs and certain disorders involving the immune mechanism: Secondary | ICD-10-CM

## 2020-05-12 DIAGNOSIS — Z13220 Encounter for screening for lipoid disorders: Secondary | ICD-10-CM

## 2020-05-12 DIAGNOSIS — R569 Unspecified convulsions: Secondary | ICD-10-CM

## 2020-05-12 DIAGNOSIS — R221 Localized swelling, mass and lump, neck: Secondary | ICD-10-CM | POA: Diagnosis not present

## 2020-05-12 DIAGNOSIS — Z1321 Encounter for screening for nutritional disorder: Secondary | ICD-10-CM

## 2020-05-12 DIAGNOSIS — Z114 Encounter for screening for human immunodeficiency virus [HIV]: Secondary | ICD-10-CM

## 2020-05-12 DIAGNOSIS — Z1159 Encounter for screening for other viral diseases: Secondary | ICD-10-CM

## 2020-05-12 NOTE — Progress Notes (Signed)
12/28/202111:09 AM  Jennifer Luna [birth year], 29 y.o., female [id number]  Chief Complaint  Patient presents with  . transfer of care   . excessive wax from ears  . enlargement of neck    HPI:   Patient is a 29 y.o. female with past medical history significant for epilepsy who presents today for TOC.  Sees Dr. Brett Fairy at neurology Dr. Helane Rima GYN Never had a pap Not currently sexually active Currently on birth control Prior to [facility] had painful periods Denies breakthru bleeding Denies headaches, SOB  Corvin Sorbo got covid booster  Seizures well controlled Driving for past 5-6 years Sleep study recently due to insomnia Had a recent bad breakup Sleep improved now  Has had swelling to the right side of her neck for years No prior imaging done No abnormal labs or signs of infection Her mom has a history of thyroid issues   Depression screen [facility] 2/9 05/12/2020 08/12/2017 07/10/2017  Decreased Interest 0 0 0  Down, Depressed, Hopeless 0 0 0  PHQ - 2 Score 0 0 0    Fall Risk  05/12/2020 08/12/2017 07/10/2017 01/06/2016 10/29/2014  Falls in the past year? 0 No No No No  Number falls in past yr: 0 - - - -  Injury with Fall? 0 - - - -  Risk for fall due to : - - - - Impaired balance/gait  Follow up Falls evaluation completed - - - -     Allergies  Allergen Reactions  . Penicillins Rash    Has patient had a PCN reaction causing immediate rash, facial/tongue/throat swelling, SOB or lightheadedness with hypotension: No Has patient had a PCN reaction causing severe rash involving mucus membranes or skin necrosis: Yes Has patient had a PCN reaction that required hospitalization No Has patient had a PCN reaction occurring within the last 10 years: No If all of the above answers are "NO", then may proceed with Cephalosporin use.   . Sulfa Antibiotics Rash  . Erythromycin Rash    Prior to Admission medications   Medication Sig Start Date End Date Taking? Authorizing Provider   Ascorbic Acid (VITAMIN C PO) Take 1 tablet by mouth daily.    [provider]  clonazePAM (KLONOPIN) 0.5 MG tablet TAKE 1/2 (ONE-HALF) TABLET BY MOUTH AT BEDTIME 02/03/20   Dohmeier, Asencion Partridge, MD  drospirenone-ethinyl estradiol (YAZ,GIANVI,LORYNA) 3-0.02 MG tablet Take 1 tablet by mouth daily.  08/02/17   [provider]  ibuprofen (ADVIL,MOTRIN) 600 MG tablet Take 1 tablet (600 mg total) by mouth every 6 (six) hours as needed. 07/05/16   Antonietta Breach, PA-C  ketoconazole (NIZORAL) 2 % shampoo Apply 1 application topically 2 (two) times a week. 07/10/17   Forrest Moron, MD  lacosamide (VIMPAT) 50 MG TABS tablet TAKE 1 TABLET BY MOUTH ONCE DAILY IN THE MORNING AND 2 AT BEDTIME 03/12/20   Dohmeier, Asencion Partridge, MD  topiramate (TOPAMAX) 100 MG tablet 50 mg(0.5 tablets)  in AM and 100 mg (1 whole tablet) in PM by mouth. 03/12/20   Dohmeier, Asencion Partridge, MD  zonisamide (ZONEGRAN) 25 MG capsule Take 2 capsules (50 mg total) by mouth at bedtime. 03/12/20   Dohmeier, Asencion Partridge, MD    Past Medical History:  Diagnosis Date  . Amenorrhea 01/11/2012   notes from office visit, PERIMENTRUAL PAINS  . Arthralgia 04/23/2013  . Epilepsy ([place])    with absence seizures, abnormal EEG  . Lump in neck 08/2017  . Seizures (HCC)    nx of absent and grand  mal  . Shortness of breath 04/23/2013  . Sleepiness 01/11/2012   notes from office visit  . Sleepiness    DAYTIME  . Thrombocytopenia ([place]) 01/11/12   office notes from last visit, with zarontin and depakote    Past Surgical History:  Procedure Laterality Date  . shoulder injury      Social History   Tobacco Use  . Smoking status: Never Smoker  . Smokeless tobacco: Never Used  Substance Use Topics  . Alcohol use: No    Family History  Problem Relation Age of Onset  . Hypothyroidism Mother   . Arthritis Mother   . Hypertension Father   . Diabetes Father   . Hyperlipidemia Father   . Breast cancer Maternal Grandmother   . Heart disease  Maternal Grandmother   . Hypertension Maternal Grandmother   . Heart disease Maternal Grandfather   . Heart disease Paternal Grandmother   . Rheum arthritis Paternal Grandmother   . Asthma Sister   . Lupus Maternal Aunt   . Seizures Paternal Uncle        as a child  . Rheum arthritis Maternal Aunt   . Asthma Other        Maternal Great Uncle   . Asthma Sister        had as a child    Review of Systems  Constitutional: Negative for chills, fever and malaise/fatigue.  HENT: Positive for ear discharge (excessive ear wax). Negative for ear pain, hearing loss and tinnitus.   Eyes: Negative for blurred vision and double vision.  Respiratory: Negative for cough, shortness of breath and wheezing.   Cardiovascular: Negative for chest pain, palpitations and leg swelling.  Gastrointestinal: Negative for abdominal pain, blood in stool, constipation, diarrhea, heartburn, nausea and vomiting.  Genitourinary: Negative for dysuria, frequency and hematuria.  Musculoskeletal: Negative for back pain, joint pain and neck pain.       Right side Neck swelling  Skin: Negative for rash.  Neurological: Negative for dizziness, weakness and headaches.      OBJECTIVE:  Today's Vitals   05/12/20 1108  BP: 117/74  Pulse: 87  Temp: 98.9 F (37.2 C)  SpO2: 100%  Weight: 173 lb (78.5 kg)   Body mass index is 28.79 kg/m.   Physical Exam Constitutional:      General: She is not in acute distress.    Appearance: Normal appearance. She is not ill-appearing.  HENT:     Head: Normocephalic.     Right Ear: Tympanic membrane, ear canal and external ear normal. There is no impacted cerumen.     Left Ear: Tympanic membrane, ear canal and external ear normal. There is no impacted cerumen.  Cardiovascular:     Rate and Rhythm: Normal rate and regular rhythm.     Pulses: Normal pulses.     Heart sounds: Normal heart sounds. No murmur heard. No friction rub. No gallop.   Pulmonary:     Effort:  Pulmonary effort is normal. No respiratory distress.     Breath sounds: Normal breath sounds. No stridor. No wheezing, rhonchi or rales.  Abdominal:     General: Bowel sounds are normal.     Palpations: Abdomen is soft.     Tenderness: There is no abdominal tenderness.  Musculoskeletal:     Cervical back: Normal range of motion and neck supple. No rigidity or tenderness.     Right lower leg: No edema.     Left lower leg: No edema.  Lymphadenopathy:  Cervical: No cervical adenopathy.  Skin:    General: Skin is warm and dry.  Neurological:     General: No focal deficit present.     Mental Status: She is alert and oriented to person, place, and time. Mental status is at baseline.     Sensory: No sensory deficit.     Motor: No weakness.     Coordination: Coordination normal.     Gait: Gait normal.  Psychiatric:        Mood and Affect: Mood normal.        Behavior: Behavior normal.     No results found for this or any previous visit (from the past 24 hour(s)).  No results found.   ASSESSMENT and PLAN  Problem List Items Addressed This Visit      Other   Seizures ([place])  Managed by Neuro    Other Visit Diagnoses    Encounter for immunization    -  Primary   Relevant Orders   Tdap vaccine greater than or equal to 7yo IM   Encounter for hepatitis C screening test for low risk patient       Relevant Orders   HCV Ab w/Rflx to Verification   Encounter for screening for HIV       Relevant Orders   HIV Antibody (routine testing w rflx)   Family history of diabetes mellitus       Relevant Orders   CMP14+EGFR   Hemoglobin A1c   Encounter for vitamin deficiency screening       Relevant Orders   Vitamin D, 25-hydroxy   Screening for thyroid disorder       Relevant Orders   TSH   Screening for lipid disorders       Relevant Orders   Lipid Panel   Screening, anemia, deficiency, iron       Relevant Orders   CBC with Differential   Localized swelling, mass and lump,  neck       Relevant Orders   US Soft Tissue Head/Neck (NON-THYROID)  RTC precautions provided   Cervical cancer screening declined      Discussed r/se/b Encouraged to schedule when able Education provided   Excessive cerumen in both ear canals    Discussed not using qtips Encouraged to flush in shower or debrox OTC drops as needed      Will follow up with lab results   Return in about 6 months (around 11/10/2020).    Huston Foley Joslin Doell, FNP-BC Primary Care at [place], [place] [address] Ph.  [phone] Fax [phone]

## 2020-05-12 NOTE — Patient Instructions (Addendum)
Pap Test Why am I having this test? A Pap test, also called a Pap smear, is a screening test to check for signs of:  Cancer of the vagina, cervix, and uterus. The cervix is the lower part of the uterus that opens into the vagina.  Infection.  Changes that may be a sign that cancer is developing (precancerous changes). Women need this test on a regular basis. In general, you should have a Pap test every 3 years until you reach menopause or age 29. Women aged 30-60 may choose to have their Pap test done at the same time as an HPV (human papillomavirus) test every 5 years (instead of every 3 years). Your health care provider may recommend having Pap tests more or less often depending on your medical conditions and past Pap test results. What kind of sample is taken?  Your health care provider will collect a sample of cells from the surface of your cervix. This will be done using a small cotton swab, plastic spatula, or brush. This sample is often collected during a pelvic exam, when you are lying on your back on an exam table with feet in footrests (stirrups). In some cases, fluids (secretions) from the cervix or vagina may also be collected. How do I prepare for this test?  Be aware of where you are in your menstrual cycle. If you are menstruating on the day of the test, you may be asked to reschedule.  You may need to reschedule if you have a known vaginal infection on the day of the test.  Follow instructions from your health care provider about: ? Changing or stopping your regular medicines. Some medicines can cause abnormal test results, such as digitalis and tetracycline. ? Avoiding douching or taking a bath the day before or the day of the test. Tell a health care provider about:  Any allergies you have.  All medicines you are taking, including vitamins, herbs, eye drops, creams, and over-the-counter medicines.  Any blood disorders you have.  Any surgeries you have had.  Any  medical conditions you have.  Whether you are pregnant or may be pregnant. How are the results reported? Your test results will be reported as either abnormal or normal. A false-positive result can occur. A false positive is incorrect because it means that a condition is present when it is not. A false-negative result can occur. A false negative is incorrect because it means that a condition is not present when it is. What do the results mean? A normal test result means that you do not have signs of cancer of the vagina, cervix, or uterus. An abnormal result may mean that you have:  Cancer. A Pap test by itself is not enough to diagnose cancer. You will have more tests done in this case.  Precancerous changes in your vagina, cervix, or uterus.  Inflammation of the cervix.  An STD (sexually transmitted disease).  A fungal infection.  A parasite infection. Talk with your health care provider about what your results mean. Questions to ask your health care provider Ask your health care provider, or the department that is doing the test:  When will my results be ready?  How will I get my results?  What are my treatment options?  What other tests do I need?  What are my next steps? Summary  In general, women should have a Pap test every 3 years until they reach menopause or age 29.  Your health care provider will collect a   a sample of cells from the surface of your cervix. This will be done using a small cotton swab, plastic spatula, or brush.  In some cases, fluids (secretions) from the cervix or vagina may also be collected. This information is not intended to replace advice given to you by your health care provider. Make sure you discuss any questions you have with your health care provider. Document Revised: 01/09/2017 Document Reviewed: 01/09/2017 Elsevier Patient Education  2020 [employer].  Health Maintenance, Female Adopting a healthy lifestyle and getting preventive  care are important in promoting health and wellness. Ask your health care provider about:  The right schedule for you to have regular tests and exams.  Things you can do on your own to prevent diseases and keep yourself healthy. What should I know about diet, weight, and exercise? Eat a healthy diet   Eat a diet that includes plenty of vegetables, fruits, low-fat dairy products, and lean protein.  Do not eat a lot of foods that are high in solid fats, added sugars, or sodium. Maintain a healthy weight Body mass index (BMI) is used to identify weight problems. It estimates body fat based on height and weight. Your health care provider can help determine your BMI and help you achieve or maintain a healthy weight. Get regular exercise Get regular exercise. This is one of the most important things you can do for your health. Most adults should:  Exercise for at least 150 minutes each week. The exercise should increase your heart rate and make you sweat (moderate-intensity exercise).  Do strengthening exercises at least twice a week. This is in addition to the moderate-intensity exercise.  Spend less time sitting. Even light physical activity can be beneficial. Watch cholesterol and blood lipids Have your blood tested for lipids and cholesterol at 29 years of age, then have this test every 5 years. Have your cholesterol levels checked more often if:  Your lipid or cholesterol levels are high.  You are older than 29 years of age.  You are at high risk for heart disease. What should I know about cancer screening? Depending on your health history and family history, you may need to have cancer screening at various ages. This may include screening for:  Breast cancer.  Cervical cancer.  Colorectal cancer.  Skin cancer.  Lung cancer. What should I know about heart disease, diabetes, and high blood pressure? Blood pressure and heart disease  High blood pressure causes heart disease  and increases the risk of stroke. This is more likely to develop in people who have high blood pressure readings, are of African descent, or are overweight.  Have your blood pressure checked: ? Every 3-5 years if you are 54-110 years of age. ? Every year if you are 55 years old or older. Diabetes Have regular diabetes screenings. This checks your fasting blood sugar level. Have the screening done:  Once every three years after age 72 if you are at a normal weight and have a low risk for diabetes.  More often and at a younger age if you are overweight or have a high risk for diabetes. What should I know about preventing infection? Hepatitis B If you have a higher risk for hepatitis B, you should be screened for this virus. Talk with your health care provider to find out if you are at risk for hepatitis B infection. Hepatitis C Testing is recommended for:  Everyone born from [birth year] through 1965.  Anyone with known risk factors for  hepatitis C. Sexually transmitted infections (STIs)  Get screened for STIs, including gonorrhea and chlamydia, if: ? You are sexually active and are younger than 29 years of age. ? You are older than 29 years of age and your health care provider tells you that you are at risk for this type of infection. ? Your sexual activity has changed since you were last screened, and you are at increased risk for chlamydia or gonorrhea. Ask your health care provider if you are at risk.  Ask your health care provider about whether you are at high risk for HIV. Your health care provider may recommend a prescription medicine to help prevent HIV infection. If you choose to take medicine to prevent HIV, you should first get tested for HIV. You should then be tested every 3 months for as long as you are taking the medicine. Pregnancy  If you are about to stop having your period (premenopausal) and you may become pregnant, seek counseling before you get pregnant.  Take 400 to 800  micrograms (mcg) of folic acid every day if you become pregnant.  Ask for birth control (contraception) if you want to prevent pregnancy. Osteoporosis and menopause Osteoporosis is a disease in which the bones lose minerals and strength with aging. This can result in bone fractures. If you are 14 years old or older, or if you are at risk for osteoporosis and fractures, ask your health care provider if you should:  Be screened for bone loss.  Take a calcium or vitamin D supplement to lower your risk of fractures.  Be given hormone replacement therapy (HRT) to treat symptoms of menopause. Follow these instructions at home: Lifestyle  Do not use any products that contain nicotine or tobacco, such as cigarettes, e-cigarettes, and chewing tobacco. If you need help quitting, ask your health care provider.  Do not use street drugs.  Do not share needles.  Ask your health care provider for help if you need support or information about quitting drugs. Alcohol use  Do not drink alcohol if: ? Your health care provider tells you not to drink. ? You are pregnant, may be pregnant, or are planning to become pregnant.  If you drink alcohol: ? Limit how much you use to 0-1 drink a day. ? Limit intake if you are breastfeeding.  Be aware of how much alcohol is in your drink. In the [place] one drink equals one 12 oz bottle of beer (355 mL), one 5 oz glass of wine (148 mL), or one 1 oz glass of hard liquor (44 mL). General instructions  Schedule regular health, dental, and eye exams.  Stay current with your vaccines.  Tell your health care provider if: ? You often feel depressed. ? You have ever been abused or do not feel safe at home. Summary  Adopting a healthy lifestyle and getting preventive care are important in promoting health and wellness.  Follow your health care provider's instructions about healthy diet, exercising, and getting tested or screened for diseases.  Follow your health  care provider's instructions on monitoring your cholesterol and blood pressure. This information is not intended to replace advice given to you by your health care provider. Make sure you discuss any questions you have with your health care provider. Document Revised: 04/25/2018 Document Reviewed: 04/25/2018 Elsevier Patient Education  [employer].    If you have lab work done today you will be contacted with your lab results within the next 2 weeks.  If you have  not heard from [place] then please contact us. The fastest way to get your results is to register for My Chart.   IF you received an x-ray today, you will receive an invoice from [facility]. Please contact [facility] at [phone] with questions or concerns regarding your invoice.   IF you received labwork today, you will receive an invoice from [facility]. Please contact [facility] at [phone] with questions or concerns regarding your invoice.   Our billing staff will not be able to assist you with questions regarding bills from these companies.  You will be contacted with the lab results as soon as they are available. The fastest way to get your results is to activate your My Chart account. Instructions are located on the last page of this paperwork. If you have not heard from [place] regarding the results in 2 weeks, please contact this office.

## 2020-05-13 LAB — HEMOGLOBIN A1C
Est. average glucose Bld gHb Est-mCnc: 108 mg/dL
Hgb A1c MFr Bld: 5.4 % (ref 4.8–5.6)

## 2020-05-13 LAB — CMP14+EGFR
ALT: 20 IU/L (ref 0–32)
AST: 20 IU/L (ref 0–40)
Albumin/Globulin Ratio: 2 (ref 1.2–2.2)
Albumin: 4.7 g/dL (ref 3.9–5.0)
Alkaline Phosphatase: 67 IU/L (ref 44–121)
BUN/Creatinine Ratio: 13 (ref 9–23)
BUN: 10 mg/dL (ref 6–20)
Bilirubin Total: 0.3 mg/dL (ref 0.0–1.2)
CO2: 23 mmol/L (ref 20–29)
Calcium: 9.6 mg/dL (ref 8.7–10.2)
Chloride: 101 mmol/L (ref 96–106)
Creatinine, Ser: 0.79 mg/dL (ref 0.57–1.00)
GFR calc Af Amer: 117 mL/min/{1.73_m2} (ref >59)
GFR calc non Af Amer: 101 mL/min/{1.73_m2} (ref >59)
Globulin, Total: 2.3 g/dL (ref 1.5–4.5)
Glucose: 99 mg/dL (ref 65–99)
Potassium: 4 mmol/L (ref 3.5–5.2)
Sodium: 139 mmol/L (ref 134–144)
Total Protein: 7 g/dL (ref 6.0–8.5)

## 2020-05-13 LAB — CBC WITH DIFFERENTIAL/PLATELET
Basophils Absolute: 0 10*3/uL (ref 0.0–0.2)
Basos: 1 %
EOS (ABSOLUTE): 0.1 10*3/uL (ref 0.0–0.4)
Eos: 2 %
Hematocrit: 41.7 % (ref 34.0–46.6)
Hemoglobin: 14.1 g/dL (ref 11.1–15.9)
Immature Grans (Abs): 0 10*3/uL (ref 0.0–0.1)
Immature Granulocytes: 0 %
Lymphocytes Absolute: 1.2 10*3/uL (ref 0.7–3.1)
Lymphs: 36 %
MCH: 29.4 pg (ref 26.6–33.0)
MCHC: 33.8 g/dL (ref 31.5–35.7)
MCV: 87 fL (ref 79–97)
Monocytes Absolute: 0.3 10*3/uL (ref 0.1–0.9)
Monocytes: 10 %
Neutrophils Absolute: 1.7 10*3/uL (ref 1.4–7.0)
Neutrophils: 51 %
Platelets: 245 10*3/uL (ref 150–450)
RBC: 4.8 x10E6/uL (ref 3.77–5.28)
RDW: 12.2 % (ref 11.7–15.4)
WBC: 3.4 10*3/uL (ref 3.4–10.8)

## 2020-05-13 LAB — HCV INTERPRETATION

## 2020-05-13 LAB — HIV ANTIBODY (ROUTINE TESTING W REFLEX): HIV Screen 4th Generation wRfx: NONREACTIVE

## 2020-05-13 LAB — LIPID PANEL
Chol/HDL Ratio: 3.1 ratio (ref 0.0–4.4)
Cholesterol, Total: 182 mg/dL (ref 100–199)
HDL: 59 mg/dL (ref >39)
LDL Chol Calc (NIH): 100 mg/dL — ABNORMAL HIGH (ref 0–99)
Triglycerides: 132 mg/dL (ref 0–149)
VLDL Cholesterol Cal: 23 mg/dL (ref 5–40)

## 2020-05-13 LAB — HCV AB W/RFLX TO VERIFICATION: HCV Ab: 0.1 s/co ratio (ref 0.0–0.9)

## 2020-05-13 LAB — TSH: TSH: 0.556 u[IU]/mL (ref 0.450–4.500)

## 2020-05-13 LAB — VITAMIN D 25 HYDROXY (VIT D DEFICIENCY, FRACTURES): Vit D, 25-Hydroxy: 14.8 ng/mL — ABNORMAL LOW (ref 30.0–100.0)

## 2020-05-28 ENCOUNTER — Ambulatory Visit
Admission: RE | Admit: 2020-05-28 | Discharge: 2020-05-28 | Disposition: A | Discharge status: Home / Self Care | Payer: BLUE CROSS/BLUE SHIELD | Source: Ambulatory Visit | Attending: Family Medicine | Admitting: Family Medicine

## 2020-05-28 DIAGNOSIS — R221 Localized swelling, mass and lump, neck: Secondary | ICD-10-CM

## 2020-11-10 ENCOUNTER — Ambulatory Visit: Payer: 59 | Admitting: Family Medicine

## 2021-02-13 NOTE — Progress Notes (Signed)
Subjective:    Jennifer Luna - 30 y.o. female MRN [id number]  Date of birth: 07/30/1990  HPI  Jennifer Luna is to establish care.   Current issues and/or concerns: Right ear with increased wax buildup. Ongoing for several years. Reports buildup so much that sometimes wax falling out of ear. Seen by a previous primary care provider where an ear irrigation was completed. Reports subsequently developed earaches. Denies decreased hearing, ear pain, nasal irritation, throat irritation, and headaches.   History of seizures and followed by Neurology. Reports several years ago had a fall onto the right shoulder during a seizure. At that time the right shoulder popped out of place and with time popped back into place. Had an x-ray at that time and was normal. Still experiencing right shoulder aches. Denies any recent trauma or injury.   ROS per HPI    Health Maintenance:  Health Maintenance Due  Topic Date Due   PAP-Cervical Cytology Screening  Never done   PAP SMEAR-Modifier  Never done    Past Medical History: Patient Active Problem List   Diagnosis Date Noted   Generalized idiopathic epilepsy and epileptic syndromes, not intractable, without status epilepticus (HCC) 03/12/2020   Absence epileptic syndrome (HCC) 03/12/2020   Adjustment insomnia 03/12/2020   Seizures (HCC) 03/12/2020   Localization-related idiopathic epilepsy and epileptic syndromes with seizures of localized onset, not intractable, with status epilepticus (HCC) 09/06/2018   Complex febrile convulsion (HCC) 11/05/2015   Epilepsy, generalized, convulsive (HCC) 11/05/2015   Absence epileptic syndrome, not intractable, w/o status epilepticus (HCC) 04/28/2014   Arthralgia 04/23/2013   Shortness of breath 04/23/2013   Intractable absence epilepsy (HCC) 09/21/2012   Pain in joint, shoulder region 09/21/2012    Social History   reports that she has never smoked. She has never used smokeless tobacco. She reports  that she does not drink alcohol and does not use drugs.   Family History  family history includes Arthritis in her mother; Asthma in her sister, sister and another family member; Breast cancer in her maternal grandmother; Diabetes in her father; Heart disease in her maternal grandfather, maternal grandmother, and paternal grandmother; Hyperlipidemia in her father; Hypertension in her father and maternal grandmother; Hypothyroidism in her mother; Lupus in her maternal aunt; Rheum arthritis in her maternal aunt and paternal grandmother; Seizures in her paternal uncle.   Medications: reviewed and updated   Objective:   Physical Exam BP 108/76 (BP Location: Left Arm, Patient Position: Sitting, Cuff Size: Large)   Pulse 83   Temp 98.4 F (36.9 C) (Oral)   Resp 16   Ht 5\' 5"  (1.651 m)   Wt 174 lb (78.9 kg)   LMP 01/23/2021   SpO2 97%   BMI 28.96 kg/m   Physical Exam HENT:     Head: Normocephalic and atraumatic.     Right Ear: Tympanic membrane normal. There is impacted cerumen.     Left Ear: Tympanic membrane, ear canal and external ear normal.  Cardiovascular:     Rate and Rhythm: Normal rate and regular rhythm.     Pulses: Normal pulses.     Heart sounds: Normal heart sounds.  Pulmonary:     Effort: Pulmonary effort is normal.     Breath sounds: Normal breath sounds.  Musculoskeletal:     Cervical back: Normal range of motion and neck supple.  Neurological:     General: No focal deficit present.     Mental Status: She is alert and oriented to person,  place, and time.  Psychiatric:        Mood and Affect: Mood normal.        Behavior: Behavior normal.       Assessment & Plan:  1. Encounter to establish care: - Patient presents today to establish care.  - Return for annual physical examination, labs, and health maintenance. Arrive fasting meaning having no food for at least 8 hours prior to appointment. You may have only water or black coffee. Please take scheduled  medications as normal.  2. Chronic right shoulder pain: - Over-the-counter Ibuprofen and Acetaminophen for pain management. May also consider trying ice packs.  - Diagnostic x-ray right shoulder for further evaluation.  - Follow-up with primary provider as scheduled.  - DG Shoulder Right; Future  3. Excessive cerumen in right ear canal: - Referral to ENT for further evaluation and management.  - Follow-up with primary provider as scheduled.  - Ambulatory referral to ENT  4. Seizures [facility]): - Keep all scheduled appointments with Neurology.    Patient was given clear instructions to go to Emergency Department or return to medical center if symptoms don't improve, worsen, or new problems develop.The patient verbalized understanding.  I discussed the assessment and treatment plan with the patient. The patient was provided an opportunity to ask questions and all were answered. The patient agreed with the plan and demonstrated an understanding of the instructions.   The patient was advised to call back or seek an in-person evaluation if the symptoms worsen or if the condition fails to improve as anticipated.    Ricky Stabs, NP 02/17/2021, 9:54 AM Primary Care at [facility]

## 2021-02-16 ENCOUNTER — Ambulatory Visit: Payer: Medicaid Other | Admitting: Family

## 2021-02-17 ENCOUNTER — Ambulatory Visit (INDEPENDENT_AMBULATORY_CARE_PROVIDER_SITE_OTHER): Payer: 59

## 2021-02-17 ENCOUNTER — Ambulatory Visit (INDEPENDENT_AMBULATORY_CARE_PROVIDER_SITE_OTHER): Payer: 59 | Admitting: Family

## 2021-02-17 ENCOUNTER — Other Ambulatory Visit: Payer: Self-pay

## 2021-02-17 ENCOUNTER — Encounter: Payer: Self-pay | Admitting: Family

## 2021-02-17 VITALS — BP 108/76 | HR 83 | Temp 98.4°F | Resp 16 | Ht 65.0 in | Wt 174.0 lb

## 2021-02-17 DIAGNOSIS — G8929 Other chronic pain: Secondary | ICD-10-CM

## 2021-02-17 DIAGNOSIS — M25511 Pain in right shoulder: Secondary | ICD-10-CM | POA: Diagnosis not present

## 2021-02-17 DIAGNOSIS — H6121 Impacted cerumen, right ear: Secondary | ICD-10-CM | POA: Diagnosis not present

## 2021-02-17 DIAGNOSIS — R569 Unspecified convulsions: Secondary | ICD-10-CM | POA: Diagnosis not present

## 2021-02-17 DIAGNOSIS — Z7689 Persons encountering health services in other specified circumstances: Secondary | ICD-10-CM

## 2021-02-17 NOTE — Progress Notes (Signed)
  Establishing care-  Discuss wax build up Right shoulder discomfort after bad fall years ago.

## 2021-02-17 NOTE — Patient Instructions (Signed)
Thank you for choosing Primary Care at [facility] for your medical home!    Jennifer Luna was seen by Jennifer Fendt, NP today.   Jennifer Luna's primary care provider is Jennifer Stabs, NP.   For the best care possible,  you should try to see Jennifer Stabs, NP whenever you come to clinic.   We look forward to seeing you again soon!  If you have any questions about your visit today,  please call us at [phone]  Or feel free to reach your provider via MyChart.    Keeping you healthy   Get these tests Blood pressure- Have your blood pressure checked once a year by your healthcare provider.  Normal blood pressure is 120/80. Weight- Have your body mass index (BMI) calculated to screen for obesity.  BMI is a measure of body fat based on height and weight. You can also calculate your own BMI at https://www.west-esparza.com/. Cholesterol- Have your cholesterol checked regularly starting at age 49, sooner may be necessary if you have diabetes, high blood pressure, if a family member developed heart diseases at an early age or if you smoke.  Chlamydia, HIV, and other sexual transmitted disease- Get screened each year until the age of 8 then within three months of each new sexual partner. Diabetes- Have your blood sugar checked regularly if you have high blood pressure, high cholesterol, a family history of diabetes or if you are overweight.   Get these vaccines Flu shot- Every fall. Tetanus shot- Every 10 years. Menactra- Single dose; prevents meningitis.   Take these steps Don't smoke- If you do smoke, ask your healthcare provider about quitting. For tips on how to quit, go to www.smokefree.gov or call 1-800-QUIT-NOW. Be physically active- Exercise 5 days a week for at least 30 minutes.  If you are not already physically active start slow and gradually work up to 30 minutes of moderate physical activity.  Examples of moderate activity include walking briskly, mowing the yard,  dancing, swimming bicycling, etc. Eat a healthy diet- Eat a variety of healthy foods such as fruits, vegetables, low fat milk, low fat cheese, yogurt, lean meats, poultry, fish, beans, tofu, etc.  For more information on healthy eating, go to www.thenutritionsource.org Drink alcohol in moderation- Limit alcohol intake two drinks or less a day.  Never drink and drive. Dentist- Brush and floss teeth twice daily; visit your dentis twice a year. Depression-Your emotional health is as important as your physical health.  If you're feeling down, losing interest in things you normally enjoy please talk with your healthcare provider. Gun Safety- If you keep a gun in your home, keep it unloaded and with the safety lock on.  Bullets should be stored separately. Helmet use- Always wear a helmet when riding a motorcycle, bicycle, rollerblading or skateboarding. Safe sex- If you may be exposed to a sexually transmitted infection, use a condom Seat belts- Seat bels can save your life; always wear one. Smoke/Carbon Monoxide detectors- These detectors need to be installed on the appropriate level of your home.  Replace batteries at least once a year. Skin Cancer- When out in the sun, cover up and use sunscreen SPF 15 or higher. Violence- If anyone is threatening or hurting you, please tell your healthcare provider.

## 2021-02-18 NOTE — Progress Notes (Signed)
Right shoulder x-ray normal. Continue with plan discussed in office and follow-up with primary provider as scheduled.

## 2021-03-05 ENCOUNTER — Telehealth: Payer: 59 | Admitting: Family Medicine

## 2021-03-05 ENCOUNTER — Encounter: Payer: Self-pay | Admitting: Family Medicine

## 2021-03-05 DIAGNOSIS — J069 Acute upper respiratory infection, unspecified: Secondary | ICD-10-CM

## 2021-03-05 MED ORDER — PROMETHAZINE-DM 6.25-15 MG/5ML PO SYRP: 2.5000 mL | ORAL_SOLUTION | Freq: Three times a day (TID) | ORAL | 0 refills | Status: DC | PRN

## 2021-03-05 MED ORDER — DOXYCYCLINE HYCLATE 100 MG PO TABS: 100.0000 mg | ORAL_TABLET | Freq: Two times a day (BID) | ORAL | 0 refills | Status: AC

## 2021-03-05 MED ORDER — FLUTICASONE PROPIONATE 50 MCG/ACT NA SUSP: 2.0000 | Freq: Every day | NASAL | 0 refills | Status: DC

## 2021-03-05 NOTE — Progress Notes (Signed)
Virtual Visit Consent   Jennifer Luna, you are scheduled for a virtual visit with a [facility] provider today.     Just as with appointments in the office, your consent must be obtained to participate.  Your consent will be active for this visit and any virtual visit you may have with one of our providers in the next 365 days.     If you have a MyChart account, a copy of this consent can be sent to you electronically.  All virtual visits are billed to your insurance company just like a traditional visit in the office.    As this is a virtual visit, video technology does not allow for your provider to perform a traditional examination.  This may limit your provider's ability to fully assess your condition.  If your provider identifies any concerns that need to be evaluated in person or the need to arrange testing (such as labs, EKG, etc.), we will make arrangements to do so.     Although advances in technology are sophisticated, we cannot ensure that it will always work on either your end or our end.  If the connection with a video visit is poor, the visit may have to be switched to a telephone visit.  With either a video or telephone visit, we are not always able to ensure that we have a secure connection.     I need to obtain your verbal consent now.   Are you willing to proceed with your visit today?    Tylan LAMIS BEHRMANN has provided verbal consent on 03/05/2021 for a virtual visit (video or telephone).   Freddy Finner, NP   Date: 03/05/2021 8:59 AM   Virtual Visit via Video Note   I, Freddy Finner, connected with  Jennifer Luna  ([id number], 06/29/1990) on 03/05/21 at  9:00 AM EDT by a video-enabled telemedicine application and verified that I am speaking with the correct person using two identifiers.  Location: Patient: Virtual Visit Location Patient: Home Provider: Virtual Visit Location Provider: Home Office   I discussed the limitations of evaluation and management by  telemedicine and the availability of in person appointments. The patient expressed understanding and agreed to proceed.    History of Present Illness: KOLBIE Luna is a 30 y.o. who identifies as a female who was assigned female at birth, and is being seen today for sinus symptoms. Onset was Sunday evening. Watery eyes and runny nose, having body aches, pressure in neck, eyes and jaw, congestion, ear pressure, voice very raspy and sore and scratchy and swollen. Worse during the night. I have taken Sudafed, sinex nasal spray, Allegra and robitussin. Without much relief. 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    Problems:  Patient Active Problem List   Diagnosis Date Noted   Generalized idiopathic epilepsy and epileptic syndromes, not intractable, without status epilepticus (HCC) 03/12/2020   Absence epileptic syndrome (HCC) 03/12/2020   Adjustment insomnia 03/12/2020   Seizures (HCC) 03/12/2020   Localization-related idiopathic epilepsy and epileptic syndromes with seizures of localized onset, not intractable, with status epilepticus (HCC) 09/06/2018   Complex febrile convulsion (HCC) 11/05/2015   Epilepsy, generalized, convulsive (HCC) 11/05/2015   Absence epileptic syndrome, not intractable, w/o status epilepticus (HCC) 04/28/2014   Arthralgia 04/23/2013  Shortness of breath 04/23/2013   Intractable absence epilepsy (HCC) 09/21/2012   Pain in joint, shoulder region 09/21/2012    Allergies:  Allergies  Allergen Reactions   Penicillins Rash    Has patient had a PCN reaction causing immediate rash, facial/tongue/throat swelling, SOB or lightheadedness with hypotension: No Has patient had a PCN reaction causing severe rash involving mucus membranes or skin necrosis: Yes Has  patient had a PCN reaction that required hospitalization No Has patient had a PCN reaction occurring within the last 10 years: No If all of the above answers are "NO", then may proceed with Cephalosporin use.    Sulfa Antibiotics Rash   Erythromycin Rash   Medications:  Current Outpatient Medications:    Ascorbic Acid (VITAMIN C PO), Take 1 tablet by mouth daily., Disp: , Rfl:    clonazePAM (KLONOPIN) 0.5 MG tablet, TAKE 1/2 (ONE-HALF) TABLET BY MOUTH AT BEDTIME, Disp: 30 tablet, Rfl: 0   drospirenone-ethinyl estradiol (YAZ,GIANVI,LORYNA) 3-0.02 MG tablet, Take 1 tablet by mouth daily. , Disp: , Rfl: 12   ibuprofen (ADVIL,MOTRIN) 600 MG tablet, Take 1 tablet (600 mg total) by mouth every 6 (six) hours as needed., Disp: 30 tablet, Rfl: 0   ketoconazole (NIZORAL) 2 % shampoo, Apply 1 application topically 2 (two) times a week., Disp: 120 mL, Rfl: 0   lacosamide (VIMPAT) 50 MG TABS tablet, TAKE 1 TABLET BY MOUTH ONCE DAILY IN THE MORNING AND 2 AT BEDTIME, Disp: 270 tablet, Rfl: 1   topiramate (TOPAMAX) 100 MG tablet, 50 mg(0.5 tablets)  in AM and 100 mg (1 whole tablet) in PM by mouth., Disp: 270 tablet, Rfl: 3   zonisamide (ZONEGRAN) 25 MG capsule, Take 2 capsules (50 mg total) by mouth at bedtime., Disp: 180 capsule, Rfl: 3  Observations/Objective: Patient is well-developed, well-nourished in no acute distress.  Resting comfortably  at home.  Head is normocephalic, atraumatic.  No labored breathing.  Speech is clear and coherent with logical content.  Patient is alert and oriented at baseline.  Cough Hoarseness/congestion tone  Assessment and Plan:  1. 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 -discussed OTC treatments   -  promethazine-dextromethorphan (PROMETHAZINE-DM) 6.25-15 MG/5ML syrup; Take 2.5 mLs by mouth 3 (three) times daily as needed for cough.  Dispense: 118 mL; Refill: 0 - doxycycline (VIBRA-TABS) 100 MG tablet; Take 1 tablet (100 mg total) by mouth 2 (two) times daily for 7 days.  Dispense: 14 tablet; Refill: 0 - fluticasone (FLONASE) 50 MCG/ACT nasal spray; Place 2 sprays into both nostrils daily.  Dispense: 16 g; Refill: 0    Reviewed side effects, risks and benefits of medication.   Patient acknowledged agreement and understanding of the plan.   I discussed the assessment and treatment plan with the patient. The patient was provided an opportunity to ask questions and all were answered. The patient agreed with the plan and demonstrated an understanding of the instructions.   The patient was advised to call back or seek an in-person evaluation if the symptoms worsen or if the condition fails to improve as anticipated.   The above assessment and management plan was discussed with the patient. The patient verbalized understanding of and has agreed to the management plan. Patient is aware  to call the clinic if symptoms persist or worsen. Patient is aware when to return to the clinic for a follow-up visit. Patient educated on when it is appropriate to go to the emergency department.   Follow Up Instructions: I discussed the assessment and treatment plan with the patient. The patient was provided an opportunity to ask questions and all were answered. The patient agreed with the plan and demonstrated an understanding of the instructions.  A copy of instructions were sent to the patient via MyChart unless otherwise noted below.    The patient was advised to call back or seek an in-person evaluation if the symptoms worsen or if the condition fails to improve as anticipated.  Time:  I spent 10 minutes with the patient via telehealth technology discussing the above problems/concerns.    Freddy Finner,  NP

## 2021-03-05 NOTE — Patient Instructions (Addendum)
I appreciate the opportunity to provide you with care for your health and wellness.  Take medications at directed  Work note- Monday return  Look up covid test for shelf life extension.   Please continue to practice social distancing to keep you, your family, and our community safe.  If you must go out, please wear a mask and practice good handwashing.  Have a wonderful day. With Gratitude, Tereasa Coop, DNP, AGNP-BC   Today you were seen for URI/Sinus Infection.  Please take the prescribed medication as directed and complete the full dose to prevent reinfection.  Please eat yogurt or take a probiotic if possible to avoid a yeast infection (this is common) from antibiotic. Should you develop a infection call the office or send a message in MyChart to let us know. We will address it quickly.  As suggested for symptom management use Nasal saline spray to help ease congestion and dry nasal passages. Can be used throughout the day as needed. Avoid forceful blowing of nose. It will be common to have some blood if blowing nose a lot or if nose is very dry. Can you a humidifier at home as well. Remember to wash and air it out regularly. Tylenol (325 mg 2 tablets every 6 hours) and or Ibuprofen (200- 400 mg every 8 hours) for fever, sore throat and body aches. Can consider use of a Neti-pot to wash out sinus cavity (twice daily). Please be aware that you need to use distilled or boiled water for these. If congestion is increasing and or coughing can use Mucinex (twice daily) for cough and congestion with full glass of water.  If you have a sore throat warm fluids like hot tea or lemon water can help sooth this.    Please hydrate, rest, get fresh air daily and wash your hands well.  I hope you feel better soon.

## 2021-03-26 NOTE — Progress Notes (Signed)
Patient ID: Jennifer Luna, female    DOB: Nov 27, 1990  MRN: [id number]  CC: Annual Physical Exam   Subjective: Jennifer Luna is a 29 y.o. female who presents for annual physical exam.   Her concerns today include:  None   Patient Active Problem List   Diagnosis Date Noted   Generalized idiopathic epilepsy and epileptic syndromes, not intractable, without status epilepticus ([facility]) 03/12/2020   Absence epileptic syndrome ([facility]) 03/12/2020   Adjustment insomnia 03/12/2020   Seizures ([facility]) 03/12/2020   Localization-related idiopathic epilepsy and epileptic syndromes with seizures of localized onset, not intractable, with status epilepticus ([facility]) 09/06/2018   Complex febrile convulsion ([facility]) 11/05/2015   Epilepsy, generalized, convulsive ([facility]) 11/05/2015   Epilepsy ([facility]) 04/28/2014   Arthralgia 04/23/2013   Shortness of breath 04/23/2013   Intractable absence epilepsy ([facility]) 09/21/2012   Pain in joint, shoulder region 09/21/2012     Current Outpatient Medications on File Prior to Visit  Medication Sig Dispense Refill   Ascorbic Acid (VITAMIN C PO) Take 1 tablet by mouth daily.     clonazePAM (KLONOPIN) 0.5 MG tablet TAKE 1/2 (ONE-HALF) TABLET BY MOUTH AT BEDTIME 30 tablet 0   drospirenone-ethinyl estradiol (YAZ,GIANVI,LORYNA) 3-0.02 MG tablet Take 1 tablet by mouth daily.   12   fluticasone (FLONASE) 50 MCG/ACT nasal spray Place 2 sprays into both nostrils daily. 16 g 0   ibuprofen (ADVIL,MOTRIN) 600 MG tablet Take 1 tablet (600 mg total) by mouth every 6 (six) hours as needed. 30 tablet 0   ketoconazole (NIZORAL) 2 % shampoo Apply 1 application topically 2 (two) times a week. 120 mL 0   lacosamide (VIMPAT) 50 MG TABS tablet TAKE 1 TABLET BY MOUTH ONCE DAILY IN THE MORNING AND 2 AT BEDTIME 270 tablet 1   promethazine-dextromethorphan (PROMETHAZINE-DM) 6.25-15 MG/5ML syrup Take 2.5 mLs by mouth 3 (three) times daily as needed for cough. 118 mL 0   topiramate (TOPAMAX) 100 MG  tablet 50 mg(0.5 tablets)  in AM and 100 mg (1 whole tablet) in PM by mouth. 270 tablet 3   zonisamide (ZONEGRAN) 25 MG capsule Take 2 capsules (50 mg total) by mouth at bedtime. 180 capsule 3   No current facility-administered medications on file prior to visit.    Allergies  Allergen Reactions   Penicillins Rash    Has patient had a PCN reaction causing immediate rash, facial/tongue/throat swelling, SOB or lightheadedness with hypotension: No Has patient had a PCN reaction causing severe rash involving mucus membranes or skin necrosis: Yes Has patient had a PCN reaction that required hospitalization No Has patient had a PCN reaction occurring within the last 10 years: No If all of the above answers are "NO", then may proceed with Cephalosporin use.    Sulfa Antibiotics Rash   Erythromycin Rash    Social History   Socioeconomic History   Marital status: Single    Spouse name: Not on file   Number of children: 0   Years of education: 12   Highest education level: Not on file  Occupational History   Occupation: Geographical information systems officer: UNEMPLOYED   Occupation: Lexicographer: UNEMPLOYED  Tobacco Use   Smoking status: Never   Smokeless tobacco: Never  Vaping Use   Vaping Use: Never used  Substance and Sexual Activity   Alcohol use: No   Drug use: No   Sexual activity: Not on file  Other Topics Concern   Not on file  Social History Narrative  Patient is single and lives at home with her parents.   Patient is working part-time.   Patient has a high school education.   Patient is right-handed.   Patient drinks about 1-2 cups of hot tea daily   Social Determinants of Health   Financial Resource Strain: Not on file  Food Insecurity: Not on file  Transportation Needs: Not on file  Physical Activity: Not on file  Stress: Not on file  Social Connections: Not on file  Intimate Partner Violence: Not on file    Family History  Problem Relation Age of Onset    Hypothyroidism Mother    Arthritis Mother    Hypertension Father    Diabetes Father    Hyperlipidemia Father    Breast cancer Maternal Grandmother    Heart disease Maternal Grandmother    Hypertension Maternal Grandmother    Heart disease Maternal Grandfather    Heart disease Paternal Grandmother    Rheum arthritis Paternal Grandmother    Asthma Sister    Lupus Maternal Aunt    Seizures Paternal Uncle        as a child   Rheum arthritis Maternal Aunt    Asthma Other        Maternal Great Uncle    Asthma Sister        had as a child    Past Surgical History:  Procedure Laterality Date   shoulder injury      ROS: Review of Systems Negative except as stated above  PHYSICAL EXAM: BP 118/81 (BP Location: Left Arm, Patient Position: Sitting, Cuff Size: Normal)   Pulse 89   Temp 98.3 F (36.8 C)   Resp 18   Ht _0  (1.651 m)   Wt 180 lb 12.8 oz (82 kg)   SpO2 97%   BMI 30.09 kg/m   Physical Exam HENT:     Head: Normocephalic and atraumatic.     Right Ear: Tympanic membrane, ear canal and external ear normal.     Left Ear: Tympanic membrane, ear canal and external ear normal.  Eyes:     Extraocular Movements: Extraocular movements intact.     Conjunctiva/sclera: Conjunctivae normal.     Pupils: Pupils are equal, round, and reactive to light.  Cardiovascular:     Rate and Rhythm: Normal rate and regular rhythm.     Pulses: Normal pulses.     Heart sounds: Normal heart sounds.  Pulmonary:     Effort: Pulmonary effort is normal.     Breath sounds: Normal breath sounds.  Chest:     Comments: Patient declined exam. Abdominal:     General: Bowel sounds are normal.     Palpations: Abdomen is soft.  Genitourinary:    Comments: Patient declined exam. Musculoskeletal:        General: Normal range of motion.     Cervical back: Normal range of motion and neck supple.  Skin:    General: Skin is warm and dry.     Capillary Refill: Capillary refill takes less than  2 seconds.  Neurological:     General: No focal deficit present.     Mental Status: She is alert and oriented to person, place, and time.  Psychiatric:        Mood and Affect: Mood normal.        Behavior: Behavior normal.   ASSESSMENT AND PLAN: 1. Annual physical exam: - Counseled on 150 minutes of exercise per week as tolerated, healthy eating (including decreased daily  intake of saturated fats, cholesterol, added sugars, sodium), STI prevention, and routine healthcare maintenance.  2. Screening for metabolic disorder: - OZY24+MGNO to check kidney function, liver function, and electrolyte balance.  - CMP14+EGFR  3. Screening for deficiency anemia: - CBC to screen for anemia. - CBC  4. Diabetes mellitus screening: - Hemoglobin A1c to screen for pre-diabetes/diabetes. - Hemoglobin A1c  5. Screening cholesterol level: - Lipid panel to screen for high cholesterol.  - Lipid panel  6. Thyroid disorder screen: - TSH to check thyroid function.  - TSH  7. Pap smear for cervical cancer screening: 8. Routine screening for STI (sexually transmitted infection): - Patient declined.   Patient was given the opportunity to ask questions.  Patient verbalized understanding of the plan and was able to repeat key elements of the plan. Patient was given clear instructions to go to Emergency Department or return to medical center if symptoms don't improve, worsen, or new problems develop.The patient verbalized understanding.   Orders Placed This Encounter  Procedures   CBC   Lipid panel   TSH   CMP14+EGFR   Hemoglobin A1c    Return in about 1 year (around 03/31/2022) for Physical per patient preference.  Camillia Herter, NP

## 2021-03-31 ENCOUNTER — Other Ambulatory Visit: Payer: Self-pay

## 2021-03-31 ENCOUNTER — Ambulatory Visit (INDEPENDENT_AMBULATORY_CARE_PROVIDER_SITE_OTHER): Payer: 59 | Admitting: Family

## 2021-03-31 ENCOUNTER — Encounter: Payer: Self-pay | Admitting: Family

## 2021-03-31 VITALS — BP 118/81 | HR 89 | Temp 98.3°F | Resp 18 | Ht 65.0 in | Wt 180.8 lb

## 2021-03-31 DIAGNOSIS — Z13220 Encounter for screening for lipoid disorders: Secondary | ICD-10-CM

## 2021-03-31 DIAGNOSIS — Z131 Encounter for screening for diabetes mellitus: Secondary | ICD-10-CM | POA: Diagnosis not present

## 2021-03-31 DIAGNOSIS — Z0000 Encounter for general adult medical examination without abnormal findings: Secondary | ICD-10-CM | POA: Diagnosis not present

## 2021-03-31 DIAGNOSIS — Z130 Encounter for screening for diseases of the blood and blood-forming organs and certain disorders involving the immune mechanism: Secondary | ICD-10-CM

## 2021-03-31 DIAGNOSIS — Z1329 Encounter for screening for other suspected endocrine disorder: Secondary | ICD-10-CM

## 2021-03-31 DIAGNOSIS — Z124 Encounter for screening for malignant neoplasm of cervix: Secondary | ICD-10-CM

## 2021-03-31 DIAGNOSIS — Z113 Encounter for screening for infections with a predominantly sexual mode of transmission: Secondary | ICD-10-CM

## 2021-03-31 DIAGNOSIS — Z13228 Encounter for screening for other metabolic disorders: Secondary | ICD-10-CM | POA: Diagnosis not present

## 2021-03-31 NOTE — Patient Instructions (Signed)

## 2021-03-31 NOTE — Progress Notes (Signed)
Pt presents for annual physical exam declines pap smear stated never had one but has gyn. No other concerns

## 2021-04-01 LAB — CMP14+EGFR
ALT: 18 IU/L (ref 0–32)
AST: 19 IU/L (ref 0–40)
Albumin/Globulin Ratio: 2 (ref 1.2–2.2)
Albumin: 4.6 g/dL (ref 3.9–5.0)
Alkaline Phosphatase: 64 IU/L (ref 44–121)
BUN/Creatinine Ratio: 20 (ref 9–23)
BUN: 15 mg/dL (ref 6–20)
Bilirubin Total: 0.3 mg/dL (ref 0.0–1.2)
CO2: 21 mmol/L (ref 20–29)
Calcium: 9 mg/dL (ref 8.7–10.2)
Chloride: 105 mmol/L (ref 96–106)
Creatinine, Ser: 0.75 mg/dL (ref 0.57–1.00)
Globulin, Total: 2.3 g/dL (ref 1.5–4.5)
Glucose: 77 mg/dL (ref 70–99)
Potassium: 4.6 mmol/L (ref 3.5–5.2)
Sodium: 142 mmol/L (ref 134–144)
Total Protein: 6.9 g/dL (ref 6.0–8.5)
eGFR: 110 mL/min/{1.73_m2} (ref >59)

## 2021-04-01 LAB — LIPID PANEL
Chol/HDL Ratio: 2.5 ratio (ref 0.0–4.4)
Cholesterol, Total: 155 mg/dL (ref 100–199)
HDL: 61 mg/dL (ref >39)
LDL Chol Calc (NIH): 79 mg/dL (ref 0–99)
Triglycerides: 78 mg/dL (ref 0–149)
VLDL Cholesterol Cal: 15 mg/dL (ref 5–40)

## 2021-04-01 LAB — HEMOGLOBIN A1C
Est. average glucose Bld gHb Est-mCnc: 111 mg/dL
Hgb A1c MFr Bld: 5.5 % (ref 4.8–5.6)

## 2021-04-01 LAB — CBC
Hematocrit: 40.4 % (ref 34.0–46.6)
Hemoglobin: 13.5 g/dL (ref 11.1–15.9)
MCH: 30 pg (ref 26.6–33.0)
MCHC: 33.4 g/dL (ref 31.5–35.7)
MCV: 90 fL (ref 79–97)
Platelets: 238 10*3/uL (ref 150–450)
RBC: 4.5 x10E6/uL (ref 3.77–5.28)
RDW: 12.4 % (ref 11.7–15.4)
WBC: 3.6 10*3/uL (ref 3.4–10.8)

## 2021-04-01 LAB — TSH: TSH: 0.525 u[IU]/mL (ref 0.450–4.500)

## 2021-04-01 NOTE — Progress Notes (Signed)
Kidney function normal.   Liver function normal.   Thyroid function normal.  No diabetes.   No anemia.   Cholesterol normal.

## 2021-04-02 NOTE — Progress Notes (Signed)
  Labs results and result note from provider reviewed by patient through Mychart  

## 2021-08-09 ENCOUNTER — Encounter: Payer: Self-pay | Admitting: Neurology

## 2021-08-09 ENCOUNTER — Ambulatory Visit: Payer: Managed Care, Other (non HMO) | Admitting: Neurology

## 2021-08-09 ENCOUNTER — Other Ambulatory Visit: Payer: Self-pay

## 2021-08-09 VITALS — BP 111/68 | HR 68 | Ht 65.0 in | Wt 189.0 lb

## 2021-08-09 DIAGNOSIS — G40A09 Absence epileptic syndrome, not intractable, without status epilepticus: Secondary | ICD-10-CM

## 2021-08-09 DIAGNOSIS — R569 Unspecified convulsions: Secondary | ICD-10-CM

## 2021-08-09 DIAGNOSIS — G40301 Generalized idiopathic epilepsy and epileptic syndromes, not intractable, with status epilepticus: Secondary | ICD-10-CM

## 2021-08-09 DIAGNOSIS — G40309 Generalized idiopathic epilepsy and epileptic syndromes, not intractable, without status epilepticus: Secondary | ICD-10-CM | POA: Diagnosis not present

## 2021-08-09 MED ORDER — LACOSAMIDE 50 MG PO TABS: ORAL_TABLET | ORAL | 1 refills | Status: DC

## 2021-08-09 MED ORDER — CLONAZEPAM 0.5 MG PO TABS: ORAL_TABLET | ORAL | 0 refills | Status: DC

## 2021-08-09 MED ORDER — TOPIRAMATE 100 MG PO TABS: ORAL_TABLET | ORAL | 3 refills | Status: DC

## 2021-08-09 MED ORDER — ZONISAMIDE 25 MG PO CAPS: 50.0000 mg | ORAL_CAPSULE | Freq: Every day | ORAL | 3 refills | Status: DC

## 2021-08-09 NOTE — Progress Notes (Signed)
? ? ?PATIENT: Jennifer Luna ?DOB: [birth year] ? ?REASON FOR VISIT: follow up- seizures ?HISTORY FROM: patient ? ? ? ? 08-09-2021: I am seeing Jennifer Luna on 08/09/21 at  8:30 AM EDT , for a scheduled revisit. Here also to review her HST. 04-2020- insignificant AHI, some snoring, waking up at 4 AM every day. She drives, works at a [employer]. ?Mainly here to get refills.  ? ? ? ? ? ?03-12-2020: RVShe has not had breakthrough seizures , she just needs refills.  ?Her mother has not noted any breakthrough activity. No staring attacks, no tremors.  ?Patient is fully covid vaccinated , gets her booster next week. She has been waking up each night between 2 and 4 AM, and may take 60 minutes to go back to sleep- she advanced her bedtime a little- bedtime now at 9 PM.  ?Some nights the arousal is due to nocturia, some nights its not. She changed form prone sleep to supine - wakes up on her back with a dry mouth.  No headaches.  ?She is now looking for work- she starts at 10 AM on Z[employer], after 12 noon, she will run errands, gets day light exposure. ? ?HISTORY OF PRESENT ILLNESS: ? ?[facility] a patient I have followed since 2011. Jennifer Luna is currently taking Vimpat 100 mg by mouth twice a day, topiramate 100 mg twice a day and Klonopin at bedtime. She continues to be well controlled. Her last seizure occurred in 2013. 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 She will loose health insurance through her parents in October, wants her refills on 90 day bases now. CMET ordered.  ? ?11-05-2015,Ms. Jennifer ClimesFairfax is a 31 year old female with a history of seizures. She continues to take Vimpat, Topamax and clonazepam. She reports that she is not had any seizures since the last visit. She is able to operate a motor vehicle without difficulty. She is able to complete all ADLs independently. She states that her  seizures typically consist of staring spells that last for several seconds. She denies any changes in her mood or behavior. She does state that she's recently been very tired. She states that she works 4 hours a day and is also in [employer]. She states that lately she has been very busy. She denies any changes with her walking or balance. However her mother speaks up and states that she is very stiff for a 31 year old. ? She states that when she is gets out of bed in the morning she does feel stiff in her joints specifically the hips and knees. She states that when she gets going this improves.  ?The mother states that she has fibromyalgia and is wondering if some of the symptoms that her daughter has maybe related to fibromyalgia as well. She returns today to obtain refills for her antiepileptic medication. She also described recently a spell where she had some myoclonic jerking in the right elbow and right knee, she was out in public and left the event concerned that this may proceed a seizure but this did not happen. Her last recorded seizures from 2013. ?Jennifer Luna has been driving, she is currently in summer break from school. ? ?HISTORY 10/29/14:  Jennifer Luna is a 31 y.o. female  Is seen here as a  revisit  from Dr. Cleta Albertsaub. She has been one year seizure free and driving. She still has occasional  little absence "blurps" but she has not had the loss of conversational flow and the secondary generalization. ?She had onset of seizures at age 53 . Her mother described the toddler coming up to her and sudden eye flutter and staring off, unresponsive.  ? ?Her last  seizure in 2013 , April.    ?Driving to work, has 2 part time jobs. She has developed well, has become much more independent and can drive.  ? ?She's taking 2 Vimpat 100 mg by mouth twice a day, she's taking Topiramate 100 mg bid po.   ?She's taking Klonopin or 0.5 mg a half tablet by mouth at bedtime.   ?She has been informed about a VNS treatment  option, she doesn't want now.  ? ? ?REVIEW OF SYSTEMS: Out of a complete 14 system review of symptoms, the patient complains only of the following symptoms, and all other reviewed systems are negative. ? ?Improved Fatigue, which I though had been Vimpat related.  ?Later bed time now during Index 19 stay at home. Lost her job, sadly.  Next week starting a new full time job.  ?Home exercise. ? ? ? ?ALLERGIES: ?Allergies  ?Allergen Reactions  ? Penicillins Rash  ?  Has patient had a PCN reaction causing immediate rash, facial/tongue/throat swelling, SOB or lightheadedness with hypotension: No ?Has patient had a PCN reaction causing severe rash involving mucus membranes or skin necrosis: Yes ?Has patient had a PCN reaction that required hospitalization No ?Has patient had a PCN reaction occurring within the last 10 years: No ?If all of the above answers are "NO", then may proceed with Cephalosporin use. ?  ? Sulfa Antibiotics Rash  ? Erythromycin Rash  ? ? ?HOME MEDICATIONS: ?Outpatient Medications Prior to Visit  ?Medication Sig Dispense Refill  ? Ascorbic Acid (VITAMIN C PO) Take 1 tablet by mouth daily.    ? clonazePAM (KLONOPIN) 0.5 MG tablet TAKE 1/2 (ONE-HALF) TABLET BY MOUTH AT BEDTIME 30 tablet 0  ? drospirenone-ethinyl estradiol (YAZ,GIANVI,LORYNA) 3-0.02 MG tablet Take 1 tablet by mouth daily.   12  ? fluticasone (FLONASE) 50 MCG/ACT nasal spray Place 2 sprays into both nostrils daily. 16 g 0  ? ibuprofen (ADVIL,MOTRIN) 600 MG tablet Take 1 tablet (600 mg total) by mouth every 6 (six) hours as needed. 30 tablet 0  ? ketoconazole (NIZORAL) 2 % shampoo Apply 1 application topically 2 (two) times a week. 120 mL 0  ? lacosamide (VIMPAT) 50 MG TABS tablet TAKE 1 TABLET BY MOUTH ONCE DAILY IN THE MORNING AND 2 AT BEDTIME 270 tablet 1  ? promethazine-dextromethorphan (PROMETHAZINE-DM) 6.25-15 MG/5ML syrup Take 2.5 mLs by mouth 3 (three) times daily as needed for cough. 118 mL 0  ? topiramate (TOPAMAX) 100 MG tablet  50 mg(0.5 tablets)  in AM and 100 mg (1 whole tablet) in PM by mouth. 270 tablet 3  ? zonisamide (ZONEGRAN) 25 MG capsule Take 2 capsules (50 mg total) by mouth at bedtime. 180 capsule 3  ? ?No facility-administered medications prior to visit.  ? ? ?PAST MEDICAL HISTORY: ?Past Medical History:  ?Diagnosis Date  ? Amenorrhea 01/11/2012  ? notes from office visit, PERIMENTRUAL PAINS  ? Arthralgia 04/23/2013  ? Epilepsy (HCC)   ? with absence seizures, abnormal EEG  ? Lump in neck 08/2017  ? Seizures (HCC)   ? nx of absent and grand mal  ? Shortness of breath 04/23/2013  ? Sleepiness 01/11/2012  ? notes from office visit  ? Sleepiness   ? DAYTIME  ?  Thrombocytopenia (HCC) 01/11/12  ? office notes from last visit, with zarontin and depakote  ? ? ?PAST SURGICAL HISTORY: ?Past Surgical History:  ?Procedure Laterality Date  ? shoulder injury    ? ? ?FAMILY HISTORY: ?Family History  ?Problem Relation Age of Onset  ? Hypothyroidism Mother   ? Arthritis Mother   ? Hypertension Father   ? Diabetes Father   ? Hyperlipidemia Father   ? Breast cancer Maternal Grandmother   ? Heart disease Maternal Grandmother   ? Hypertension Maternal Grandmother   ? Heart disease Maternal Grandfather   ? Heart disease Paternal Grandmother   ? Rheum arthritis Paternal Grandmother   ? Asthma Sister   ? Lupus Maternal Aunt   ? Seizures Paternal Uncle   ?     as a child  ? Rheum arthritis Maternal Aunt   ? Asthma Other   ?     Maternal Great Uncle   ? Asthma Sister   ?     had as a child  ? ? ?SOCIAL HISTORY: ?Social History  ? ?Socioeconomic History  ? Marital status: Single  ?  Spouse name: Not on file  ? Number of children: 0  ? Years of education: 16  ? Highest education level: Not on file  ?Occupational History  ? Occupation: STUDENT/[employer]ash[employer]  ?  Employer: UNEMPLOYED  ? Occupation: STUDENT  ?  Employer: UNEMPLOYED  ?Tobacco Use  ? Smoking status: Never  ? Smokeless tobacco: Never  ?Vaping Use  ? Vaping Use: Never used  ?Substance and Sexual  Activity  ? Alcohol use: No  ? Drug use: No  ? Sexual activity: Not on file  ?Other Topics Concern  ? Not on file  ?Social History Narrative  ? Patient is single and lives at home with her parents.  ? Patient is worki

## 2021-08-09 NOTE — Patient Instructions (Signed)
Seizure, Adult ?A seizure is a sudden burst of abnormal electrical and chemical activity in the brain. Seizures usually last from 30 seconds to 2 minutes.  ?What are the causes? ?Common causes of this condition include: ?Fever or infection. ?Problems that affect the brain. These may include: ?A brain or head injury. ?Bleeding in the brain. ?A brain tumor. ?Low levels of blood sugar or salt. ?Kidney problems or liver problems. ?Conditions that are passed from parent to child (are inherited). ?Problems with a substance, such as: ?Having a reaction to a drug or a medicine. ?Stopping the use of a substance all of a sudden (withdrawal). ?A stroke. ?Disorders that affect how you develop. ?Sometimes, the cause may not be known.  ?What increases the risk? ?Having someone in your family who has epilepsy. In this condition, seizures happen again and again over time. They have no clear cause. ?Having had a tonic-clonic seizure before. This type of seizure causes you to: ?Tighten the muscles of the whole body. ?Lose consciousness. ?Having had a head injury or strokes before. ?Having had a lack of oxygen at birth. ?What are the signs or symptoms? ?There are many types of seizures. The symptoms vary depending on the type of seizure you have. ?Symptoms during a seizure ?Shaking that you cannot control (convulsions) with fast, jerky movements of muscles. ?Stiffness of the body. ?Breathing problems. ?Feeling mixed up (confused). ?Staring or not responding to sound or touch. ?Head nodding. ?Eyes that blink, flutter, or move fast. ?Drooling, grunting, or making clicking sounds with your mouth ?Losing control of when you pee or poop. ?Symptoms before a seizure ?Feeling afraid, nervous, or worried. ?Feeling like you may vomit. ?Feeling like: ?You are moving when you are not. ?Things around you are moving when they are not. ?Feeling like you saw or heard something before (d?j? vu). ?Odd tastes or smells. ?Changes in how you see. You may  see flashing lights or spots. ?Symptoms after a seizure ?Feeling confused. ?Feeling sleepy. ?Headache. ?Sore muscles. ?How is this treated? ?If your seizure stops on its own, you will not need treatment. If your seizure lasts longer than 5 minutes, you will normally need treatment. Treatment may include: ?Medicines given through an IV tube. ?Avoiding things, such as medicines, that are known to cause your seizures. ?Medicines to prevent seizures. ?A device to prevent or control seizures. ?Surgery. ?A diet low in carbohydrates and high in fat (ketogenic diet). ?Follow these instructions at home: ?Medicines ?Take over-the-counter and prescription medicines only as told by your doctor. ?Avoid foods or drinks that may keep your medicine from working, such as alcohol. ?Activity ?Follow instructions about driving, swimming, or doing things that would be dangerous if you had another seizure. Wait until your doctor says it is safe for you to do these things. ?If you live in the [place] ask your local department of motor vehicles when you can drive. ?Get a lot of rest. ?Teaching others ? ?Teach friends and family what to do when you have a seizure. They should: ?Help you get down to the ground. ?Protect your head and body. ?Loosen any clothing around your neck. ?Turn you on your side. ?Know whether or not you need emergency care. ?Stay with you until you are better. ?Also, tell them what not to do if you have a seizure. Tell them: ?They should not hold you down. ?They should not put anything in your mouth. ?General instructions ?Avoid anything that gives you seizures. ?Keep a seizure diary. Write down: ?What you remember  about each seizure. ?What you think caused each seizure. ?Keep all follow-up visits. ?Contact a doctor if: ?You have another seizure or seizures. Call the doctor each time you have a seizure. ?The pattern of your seizures changes. ?You keep having seizures with treatment. ?You have symptoms of being sick or  having an infection. ?You are not able to take your medicine. ?Get help right away if: ?You have any of these problems: ?A seizure that lasts longer than 5 minutes. ?Many seizures in a row and you do not feel better between seizures. ?A seizure that makes it harder to breathe. ?A seizure and you can no longer speak or use part of your body. ?You do not wake up right after a seizure. ?You get hurt during a seizure. ?You feel confused or have pain right after a seizure. ?These symptoms may be an emergency. Get help right away. Call your local emergency services (911 in the [place] ?Do not wait to see if the symptoms will go away. ?Do not drive yourself to the hospital. ?Summary ?A seizure is a sudden burst of abnormal electrical and chemical activity in the brain. Seizures normally last from 30 seconds to 2 minutes. ?Causes of seizures include illness, injury to the head, low levels of blood sugar or salt, and certain conditions. ?Most seizures will stop on their own in less than 5 minutes. Seizures that last longer than 5 minutes are a medical emergency and need treatment right away. ?Many medicines are used to treat seizures. Take over-the-counter and prescription medicines only as told by your doctor. ?This information is not intended to replace advice given to you by your health care provider. Make sure you discuss any questions you have with your health care provider. ?Document Revised: 11/08/2019 Document Reviewed: 11/08/2019 ?Elsevier Patient Education ? 2022 Elsevier Inc. ?Quality Sleep Information, Adult ?Quality sleep is important for your mental and physical health. It also improves your quality of life. Quality sleep means you: ?Are asleep for most of the time you are in bed. ?Fall asleep within 30 minutes. ?Wake up no more than once a night.  ?Are awake for no longer than 20 minutes if you do wake up during the night. ?Most adults need 7-8 hours of quality sleep each night. ?How can poor sleep affect  me? ?If you do not get enough quality sleep, you may have: ?Mood swings. ?Daytime sleepiness. ?Confusion. ?Decreased reaction time. ?Sleep disorders, such as insomnia and sleep apnea. ?Difficulty with: ?Solving problems. ?Coping with stress. ?Paying attention. ?These issues may affect your performance and productivity at work, school, and at home. Lack of sleep may also put you at higher risk for accidents, suicide, and risky behaviors. ?If you do not get quality sleep you may also be at higher risk for several health problems, including: ?Infections. ?Type 2 diabetes. ?Heart disease. ?High blood pressure. ?Obesity. ?Worsening of long-term conditions, like arthritis, kidney disease, depression, Parkinson's disease, and epilepsy. ?What actions can I take to get more quality sleep? ?  ?Stick to a sleep schedule. Go to sleep and wake up at about the same time each day. Do not try to sleep less on weekdays and make up for lost sleep on weekends. This does not work. ?Try to get about 30 minutes of exercise on most days. Do not exercise 2-3 hours before going to bed. ?Limit naps during the day to 30 minutes or less. ?Do not use any products that contain nicotine or tobacco, such as cigarettes or e-cigarettes. If you need  help quitting, ask your health care provider. ?Do not drink caffeinated beverages for at least 8 hours before going to bed. Coffee, tea, and some sodas contain caffeine. ?Do not drink alcohol close to bedtime. ?Do not eat large meals close to bedtime. ?Do not take naps in the late afternoon. ?Try to get at least 30 minutes of sunlight every day. Morning sunlight is best. ?Make time to relax before bed. Reading, listening to music, or taking a hot bath promotes quality sleep. ?Make your bedroom a place that promotes quality sleep. Keep your bedroom dark, quiet, and at a comfortable room temperature. Make sure your bed is comfortable. Take out sleep distractions like TV, a computer, smartphone, and bright  lights. ?If you are lying awake in bed for longer than 20 minutes, get up and do a relaxing activity until you feel sleepy. ?Work with your health care provider to treat medical conditions that may affect sle

## 2021-08-10 ENCOUNTER — Telehealth: Payer: Self-pay | Admitting: *Deleted

## 2021-08-10 NOTE — Telephone Encounter (Signed)
Called pt. She has been taking Vimpat brand name. Recently changed to [employer]. Prefers brand name and would like [place] to attempt coverage for brand.  ?She has been on generic in the past and tolerated ok per pt. ? ?I submitted PA on CMM. Key: [id number]. Waiting on determination from [place]. ?

## 2021-08-13 LAB — CBC WITH DIFFERENTIAL/PLATELET
Basophils Absolute: 0 10*3/uL (ref 0.0–0.2)
Basos: 0 %
EOS (ABSOLUTE): 0.1 10*3/uL (ref 0.0–0.4)
Eos: 2 %
Hematocrit: 41.5 % (ref 34.0–46.6)
Hemoglobin: 13.4 g/dL (ref 11.1–15.9)
Immature Grans (Abs): 0 10*3/uL (ref 0.0–0.1)
Immature Granulocytes: 0 %
Lymphocytes Absolute: 2 10*3/uL (ref 0.7–3.1)
Lymphs: 45 %
MCH: 28.7 pg (ref 26.6–33.0)
MCHC: 32.3 g/dL (ref 31.5–35.7)
MCV: 89 fL (ref 79–97)
Monocytes Absolute: 0.5 10*3/uL (ref 0.1–0.9)
Monocytes: 11 %
Neutrophils Absolute: 1.9 10*3/uL (ref 1.4–7.0)
Neutrophils: 42 %
Platelets: 207 10*3/uL (ref 150–450)
RBC: 4.67 x10E6/uL (ref 3.77–5.28)
RDW: 12.5 % (ref 11.7–15.4)
WBC: 4.5 10*3/uL (ref 3.4–10.8)

## 2021-08-13 LAB — LACOSAMIDE: Lacosamide: <0.5 ug/mL — ABNORMAL LOW (ref 5.0–10.0)

## 2021-08-13 LAB — COMPREHENSIVE METABOLIC PANEL
ALT: 19 IU/L (ref 0–32)
AST: 19 IU/L (ref 0–40)
Albumin/Globulin Ratio: 2.2 (ref 1.2–2.2)
Albumin: 4.6 g/dL (ref 3.9–5.0)
Alkaline Phosphatase: 60 IU/L (ref 44–121)
BUN/Creatinine Ratio: 17 (ref 9–23)
BUN: 12 mg/dL (ref 6–20)
Bilirubin Total: <0.2 mg/dL (ref 0.0–1.2)
CO2: 22 mmol/L (ref 20–29)
Calcium: 8.9 mg/dL (ref 8.7–10.2)
Chloride: 104 mmol/L (ref 96–106)
Creatinine, Ser: 0.7 mg/dL (ref 0.57–1.00)
Globulin, Total: 2.1 g/dL (ref 1.5–4.5)
Glucose: 90 mg/dL (ref 70–99)
Potassium: 4.3 mmol/L (ref 3.5–5.2)
Sodium: 140 mmol/L (ref 134–144)
Total Protein: 6.7 g/dL (ref 6.0–8.5)
eGFR: 119 mL/min/{1.73_m2} (ref >59)

## 2021-08-13 NOTE — Progress Notes (Signed)
There have been no recent seizures, none for years actually but the VIMPAT level is very low. Unless the patient has not tolerated higher doses of Vimpat in the past, I feel it should be 100 mg bid, given the undetectable level. ? I like for Jennifer Luna to write me back, is she is OK with that increase.

## 2021-08-16 ENCOUNTER — Encounter: Payer: Self-pay | Admitting: Neurology

## 2021-08-17 NOTE — Telephone Encounter (Signed)
Called the pt because a colleague was working on a PA for the pt and the vim pat at the current dosage she is on and she was asking if the patient was out of medication. I went to the [place] drug registry and last time was filled was 12/12/2019. I called the pharmacy to confirm that was correct and it was.  ? ?Called the pt and she states that she has not been taking it like she is suppose to. She states that she has missed doses but that she still has some. I advised with July 2021 being the last refill and only for a 3 month supply she has really been considered non compliant. I advised the pt that since she has not been consistent with taking as prescribed then that is most likely why the levels are low. Advised that she has been lucky that she has not had a seizure but she is placing herself at increase risk for seizures without being on her medication. Pt agreed that she will plan on continuing the medication and PA was approved. Advised the pharmacy stated that her script was ready for pick up. ?Informed the pt to set alarms to ensure that she is taking it daily. Once she has been on the dosage more consistently then Dr Vickey Huger will likely want to have her recheck the levels. At this time the pt will continue 50 mg in am and 100 mg at bedtime and will become more compliant with use.  ? ?

## 2021-08-17 NOTE — Telephone Encounter (Addendum)
Called [employer] at [phone]. Spoke w/ Pam. They are not showing that a PA was submitted on 08/10/21 despite [place] submitting via CMM/shows pending on our end.  ? ?After she furthered reviewed, she was able to locate PA. However, she said it was for generic and past time for urgent review, even though we marked it urgent and submitted for brand name. She transferred me to PA department to further investigate. Spoke w/ Elenor Legato. Placed me on hold and spoke w/ pharmacist. Pharmacist approved case effective 08/10/21-08/17/22. Approval # [id number] ? ?Total time of call: 59:13 min ?

## 2021-12-29 ENCOUNTER — Telehealth: Payer: Self-pay | Admitting: Family Medicine

## 2021-12-29 NOTE — Telephone Encounter (Signed)
LVM and sent MyChart msg advising pt of appt change- Amy out 3/28.

## 2022-03-06 IMAGING — US US SOFT TISSUE HEAD/NECK
1 series · 14 of 14 positions shown · non-contrast
Comparison: Soft tissue neck ultrasound-09/01/2017

CLINICAL DATA: Right-sided generalized neck swelling the past 2
years.

EXAM:
ULTRASOUND OF HEAD/NECK SOFT TISSUES
TECHNIQUE: Ultrasound examination of the head and neck soft tissues was
performed in the area of clinical concern.

[Series 1: us soft tissue head/neck · 0.05mm/px · 14 of 14 slices shown]
[im 1/14]
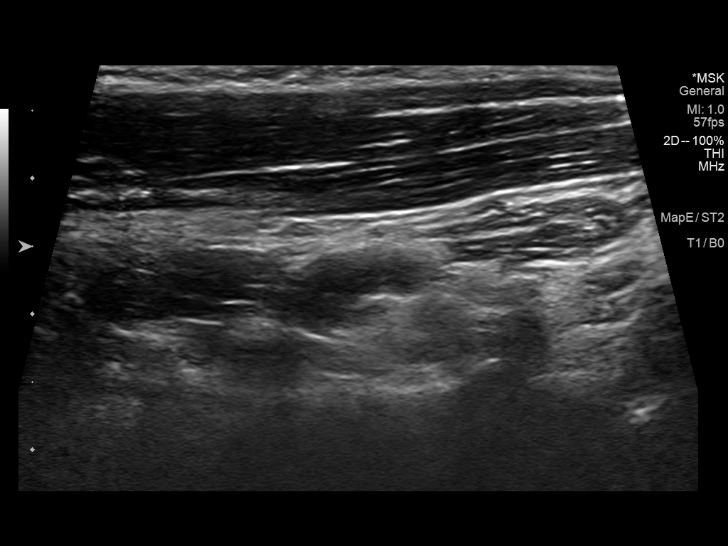
[im 2/14]
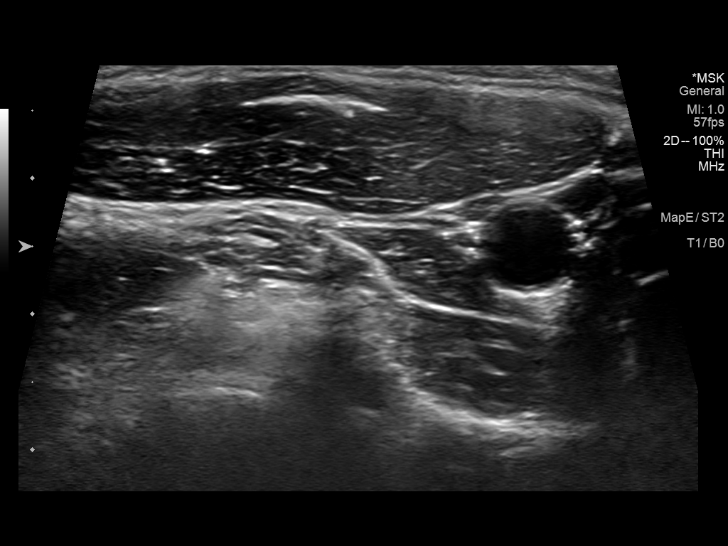
[im 3/14]
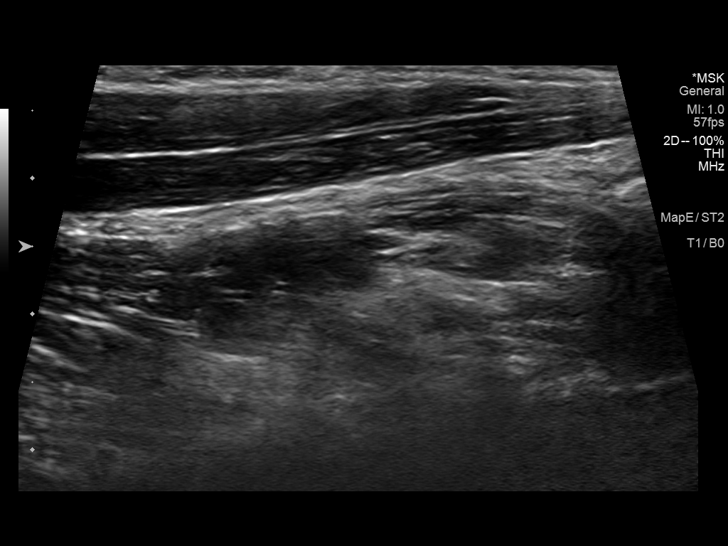
[im 4/14]
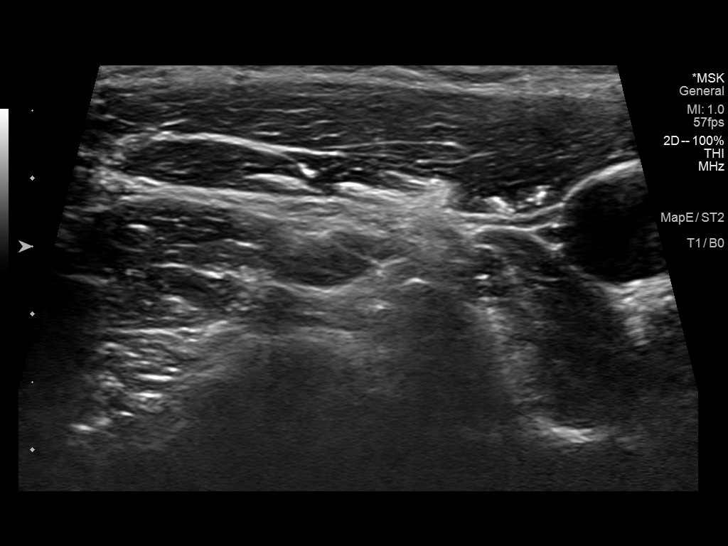
[im 5/14]
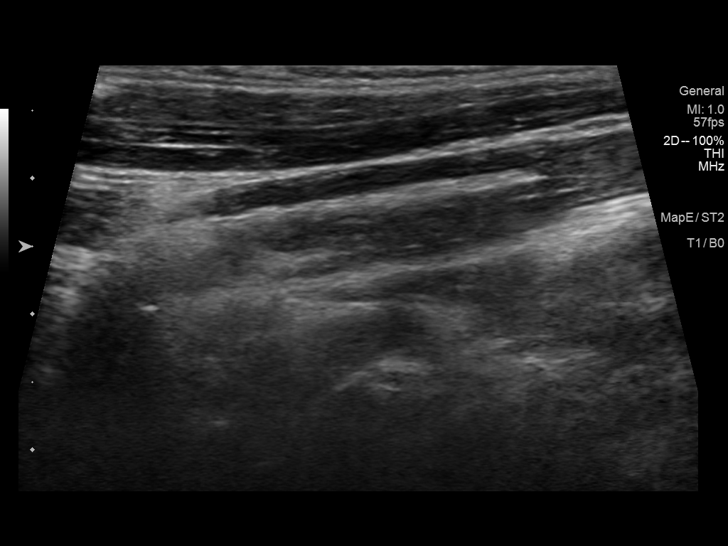
[im 6/14]
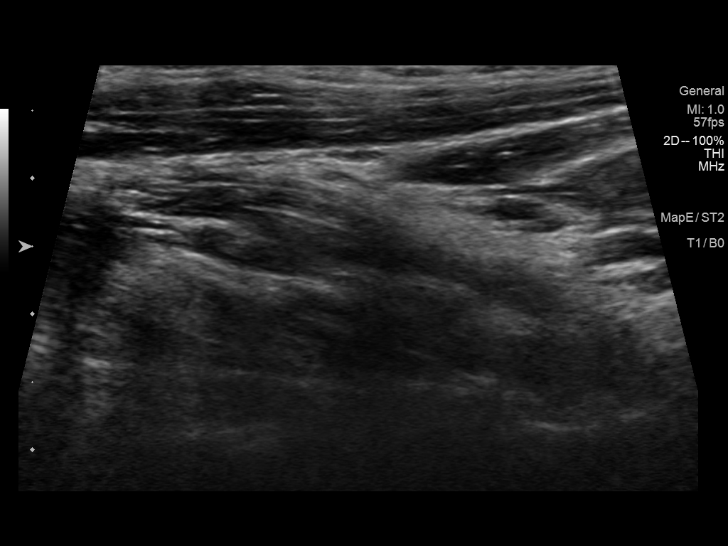
[im 7/14]
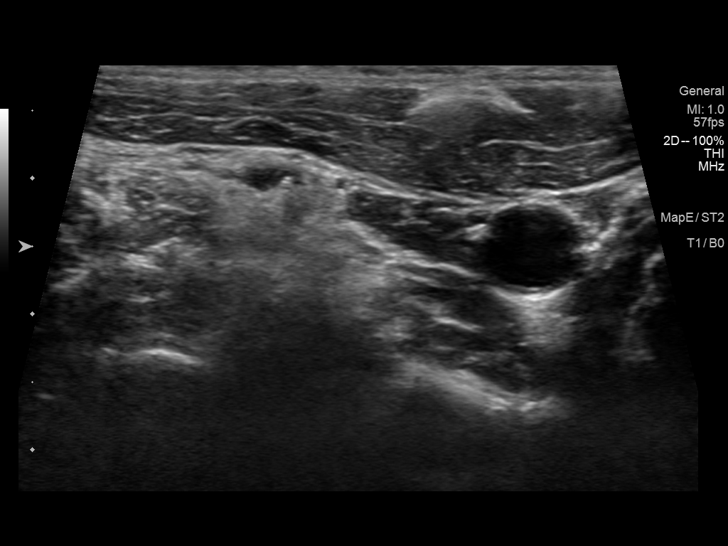
[im 8/14]
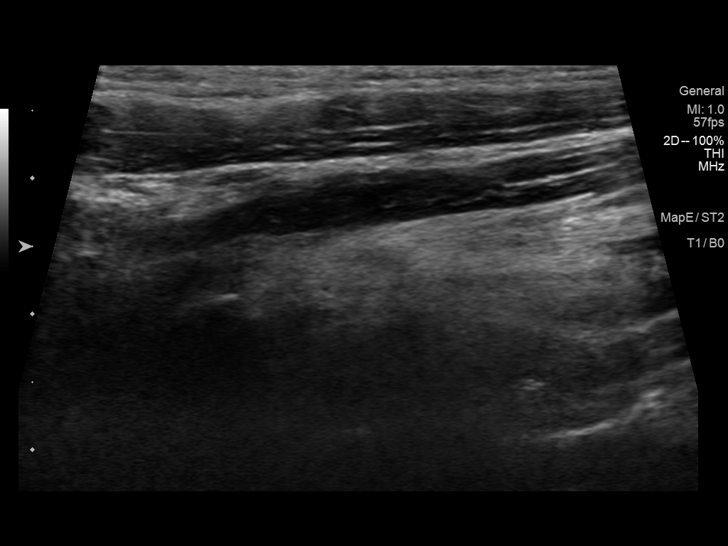
[im 9/14]
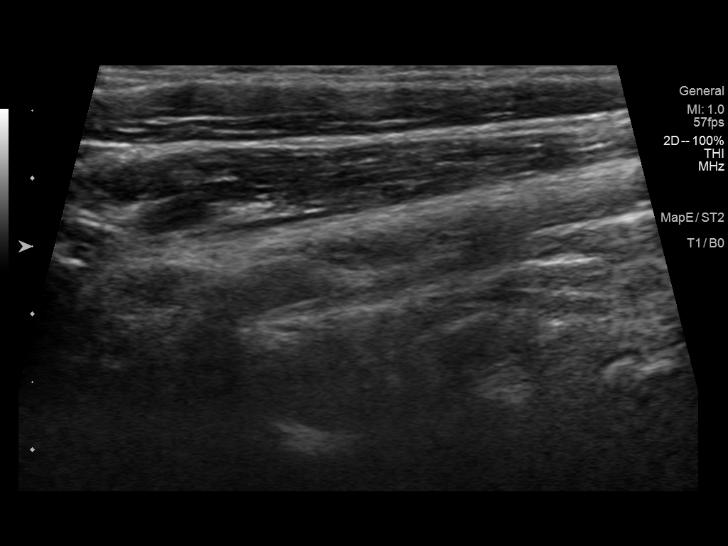
[im 10/14]
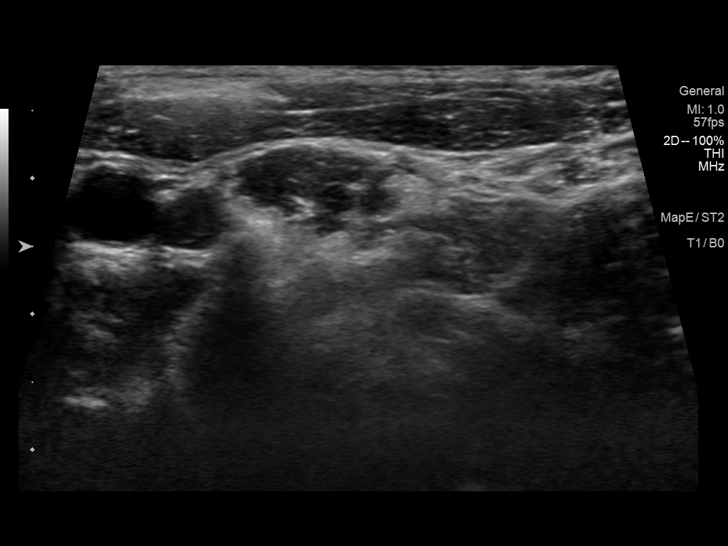
[im 11/14]
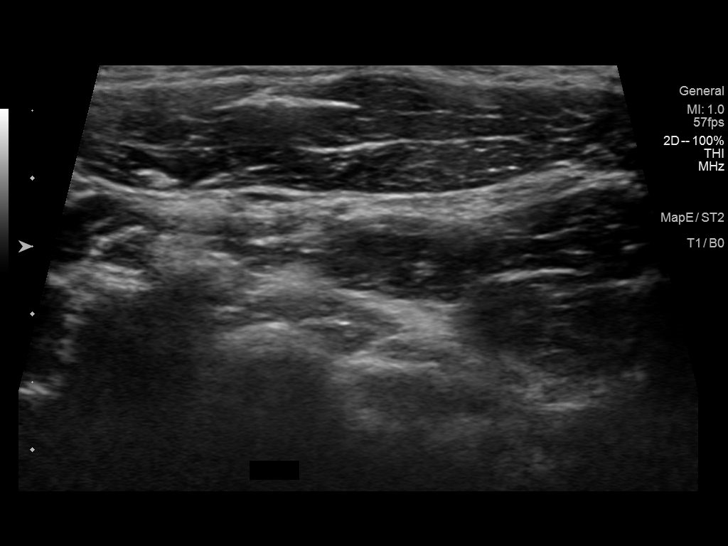
[im 12/14]
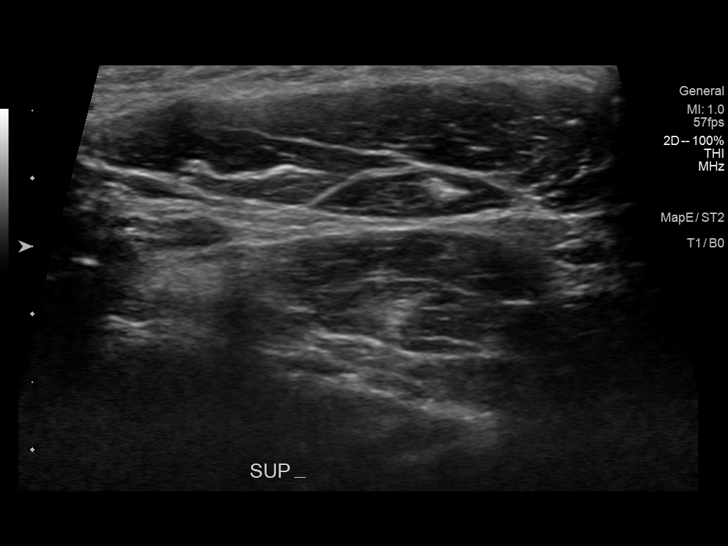
[im 13/14]
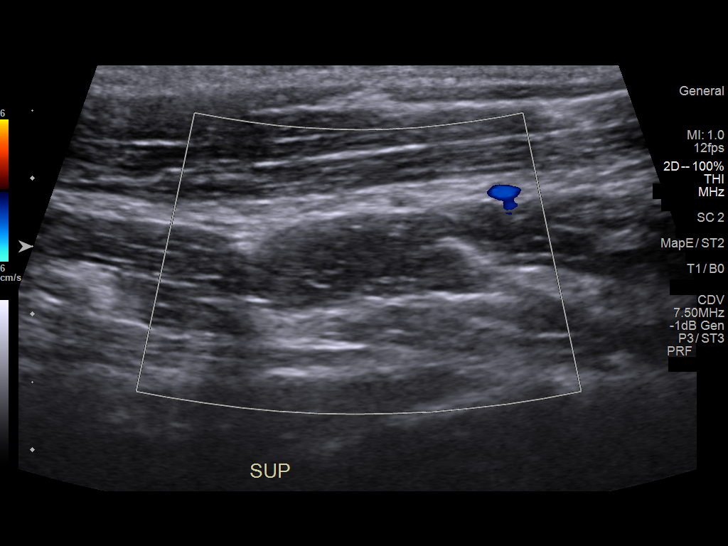
[im 14/14]
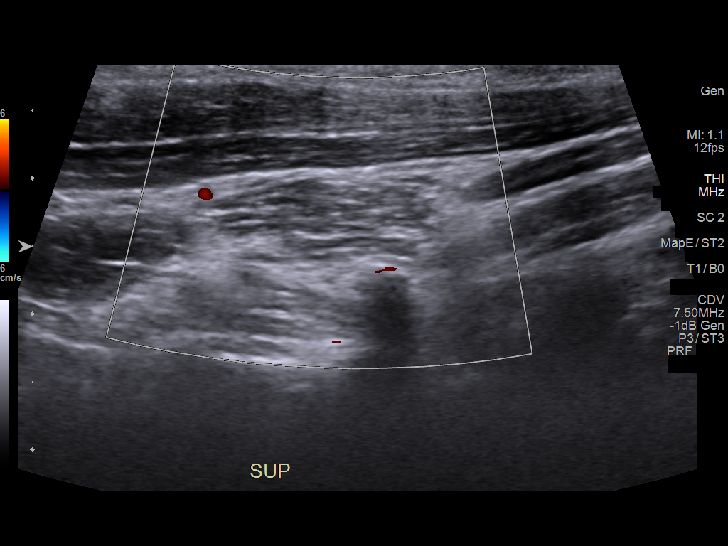

[14 of 14 positions shown; findings below may reference images not displayed]

FINDINGS: Sonographic evaluation of the patient's palpable area of concern
involving the right lateral neck is negative for sonographic
correlate. Specifically, no regional cervical lymphadenopathy. No
discrete solid or cystic lesions. Limited visualization of the
adjacent vasculature appears normal.
IMPRESSION: No sonographic correlate for patient's palpable area of concern
within the right lateral neck.

## 2022-04-27 ENCOUNTER — Other Ambulatory Visit: Payer: Self-pay | Admitting: Neurology

## 2022-04-27 ENCOUNTER — Other Ambulatory Visit: Payer: Self-pay | Admitting: Family

## 2022-04-28 MED ORDER — CLONAZEPAM 0.5 MG PO TABS: ORAL_TABLET | ORAL | 0 refills | Status: DC

## 2022-05-04 NOTE — Progress Notes (Signed)
Patient ID: Jennifer Luna, female    DOB: 1991-02-13  MRN: [id number]  CC: Annual Physical Exam  Subjective: Jennifer Luna is a 31 y.o. female who presents for annual physical exam.   Her concerns today include:  - Pap completed 03/09/2021 by Dian Queen, MD.  - Noticed change of sleep pattern began around age 10 y.o. Reports bedtime 10:00 pm. Waking up around 12:00 am and then again around 3:00 am. At those times states she stays awake < 1 hour and then returns back to sleep. Denies any contributing factors to change of sleep pattern. She is not ready to begin medication to help as of present. She is aware to follow-up with me as scheduled.   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     clonazePAM (KLONOPIN) 0.5 MG tablet TAKE 1/2 (ONE-HALF) TABLET BY MOUTH AT BEDTIME 30 tablet 0   drospirenone-ethinyl estradiol (YAZ,GIANVI,LORYNA) 3-0.02 MG tablet Take 1 tablet by mouth daily.   12   fluticasone (FLONASE) 50 MCG/ACT nasal spray Place 2 sprays into both nostrils daily. 16 g 0   ibuprofen (ADVIL,MOTRIN) 600 MG tablet Take 1 tablet (600 mg total) by mouth every 6 (six) hours as needed. 30  tablet 0   ketoconazole (NIZORAL) 2 % shampoo Apply 1 application topically 2 (two) times a week. 120 mL 0   lacosamide (VIMPAT) 50 MG TABS tablet TAKE 1 TABLET BY MOUTH ONCE DAILY IN THE MORNING AND 2 AT BEDTIME 270 tablet 1   promethazine-dextromethorphan (PROMETHAZINE-DM) 6.25-15 MG/5ML syrup Take 2.5 mLs by mouth 3 (three) times daily as needed for cough. 118 mL 0   topiramate (TOPAMAX) 100 MG tablet 50 mg(0.5 tablets)  in AM and 100 mg (1 whole tablet) in PM by mouth. 270 tablet 3   zonisamide (ZONEGRAN) 25 MG capsule Take 2 capsules (50 mg total) by mouth at bedtime. 180 capsule 3   No current facility-administered medications on file prior to visit.    Allergies  Allergen Reactions   Penicillins Rash    Has patient had a PCN reaction causing immediate rash, facial/tongue/throat swelling, SOB or lightheadedness with hypotension: No  Has patient had a PCN reaction causing severe rash involving mucus membranes or skin necrosis: Yes  Has patient had a PCN reaction that required hospitalization No  Has patient had a PCN reaction occurring within the last 10 years: No  If all of the above answers are "NO", then may proceed with Cephalosporin use.   Sulfa Antibiotics Nausea Only, Rash and Other (See Comments)   Penicillin G Nausea And Vomiting   Erythromycin Rash and Nausea And Vomiting    Social History   Socioeconomic History   Marital status: Single    Spouse name: Not on file   Number of  children: 0   Years of education: 12   Highest education level: Not on file  Occupational History   Occupation: Geographical information systems officer: UNEMPLOYED   Occupation: Lexicographer: UNEMPLOYED  Tobacco Use   Smoking status: Never    Passive exposure: Never   Smokeless tobacco: Never  Vaping Use   Vaping Use: Never used  Substance and Sexual Activity   Alcohol use: No   Drug use: No   Sexual activity: Not on file  Other Topics Concern   Not on file  Social History Narrative    Patient is single and lives at home with her parents.   Patient is working part-time.   Patient has a high school education.   Patient is right-handed.   Patient drinks about 1-2 cups of hot tea daily   Social Determinants of Health   Financial Resource Strain: Not on file  Food Insecurity: Not on file  Transportation Needs: Not on file  Physical Activity: Not on file  Stress: Not on file  Social Connections: Not on file  Intimate Partner Violence: Not on file    Family History  Problem Relation Age of Onset   Hypothyroidism Mother    Arthritis Mother    Hypertension Father    Diabetes Father    Hyperlipidemia Father    Breast cancer Maternal Grandmother    Heart disease Maternal Grandmother    Hypertension Maternal Grandmother    Heart disease Maternal Grandfather    Heart disease Paternal Grandmother    Rheum arthritis Paternal Grandmother    Asthma Sister    Lupus Maternal Aunt    Seizures Paternal Uncle        as a child   Rheum arthritis Maternal Aunt    Asthma Other        Maternal Great Uncle    Asthma Sister        had as a child    Past Surgical History:  Procedure Laterality Date   shoulder injury      ROS: Review of Systems Negative except as stated above  PHYSICAL EXAM: BP 109/79 (BP Location: Left Arm, Patient Position: Sitting, Cuff Size: Large)   Pulse 81   Temp 98.3 F (36.8 C)   Resp 16   Ht _0  (1.651 m)   Wt 177 lb (80.3 kg)   SpO2 97%   BMI 29.45 kg/m   Physical Exam HENT:     Head: Normocephalic and atraumatic.     Right Ear: Tympanic membrane, ear canal and external ear normal.     Left Ear: Tympanic membrane, ear canal and external ear normal.     Nose: Nose normal.     Mouth/Throat:     Mouth: Mucous membranes are moist.     Pharynx: Oropharynx is clear.  Eyes:     Extraocular Movements: Extraocular movements intact.     Conjunctiva/sclera: Conjunctivae normal.     Pupils: Pupils are equal, round, and reactive to  light.  Cardiovascular:     Rate and Rhythm: Normal rate and regular rhythm.     Pulses: Normal pulses.     Heart sounds: Normal heart sounds.  Pulmonary:     Effort: Pulmonary effort is normal.     Breath sounds: Normal breath sounds.  Chest:     Comments: Patient declined.  Abdominal:     General: Bowel sounds are normal.     Palpations: Abdomen is soft.  Genitourinary:    Comments:  Patient declined. Musculoskeletal:        General: Normal range of motion.     Right shoulder: Normal.     Left shoulder: Normal.     Right upper arm: Normal.     Left upper arm: Normal.     Right elbow: Normal.     Left elbow: Normal.     Right forearm: Normal.     Left forearm: Normal.     Right wrist: Normal.     Left wrist: Normal.     Right hand: Normal.     Left hand: Normal.     Cervical back: Normal, normal range of motion and neck supple.     Thoracic back: Normal.     Lumbar back: Normal.     Right hip: Normal.     Left hip: Normal.     Right upper leg: Normal.     Left upper leg: Normal.     Right knee: Normal.     Left knee: Normal.     Right lower leg: Normal.     Left lower leg: Normal.     Right ankle: Normal.     Left ankle: Normal.     Right foot: Normal.     Left foot: Normal.  Skin:    General: Skin is warm and dry.     Capillary Refill: Capillary refill takes less than 2 seconds.  Neurological:     General: No focal deficit present.     Mental Status: She is alert and oriented to person, place, and time.  Psychiatric:        Mood and Affect: Mood normal.        Behavior: Behavior normal.     ASSESSMENT AND PLAN: 1. Annual physical exam - Counseled on 150 minutes of exercise per week as tolerated, healthy eating (including decreased daily intake of saturated fats, cholesterol, added sugars, sodium), STI prevention, and routine healthcare maintenance.  2. Screening for metabolic disorder - Routine screening.  - CMP14+EGFR  3. Screening for deficiency  anemia - Routine screening.  - CBC  4. Diabetes mellitus screening - Routine screening.  - Hemoglobin A1c  5. Screening cholesterol level - Routine screening.  - Lipid panel  6. Thyroid disorder screen - Routine screening.  - TSH  7. Pap smear for cervical cancer screening 8. Routine screening for STI (sexually transmitted infection) - Patient reports completed 03/09/2021 by Dian Queen, MD.    Patient was given the opportunity to ask questions.  Patient verbalized understanding of the plan and was able to repeat key elements of the plan. Patient was given clear instructions to go to Emergency Department or return to medical center if symptoms don't improve, worsen, or new problems develop.The patient verbalized understanding.   Orders Placed This Encounter  Procedures   CBC   Lipid panel   TSH   CMP14+EGFR   Hemoglobin A1c     Return in about 1 year (around 05/14/2023) for Physical per patient preference.  Camillia Herter, NP

## 2022-05-13 ENCOUNTER — Encounter: Payer: Self-pay | Admitting: Family

## 2022-05-13 ENCOUNTER — Ambulatory Visit (INDEPENDENT_AMBULATORY_CARE_PROVIDER_SITE_OTHER): Payer: BC Managed Care – PPO | Admitting: Family

## 2022-05-13 VITALS — BP 109/79 | HR 81 | Temp 98.3°F | Resp 16 | Ht 65.0 in | Wt 177.0 lb

## 2022-05-13 DIAGNOSIS — Z113 Encounter for screening for infections with a predominantly sexual mode of transmission: Secondary | ICD-10-CM

## 2022-05-13 DIAGNOSIS — Z124 Encounter for screening for malignant neoplasm of cervix: Secondary | ICD-10-CM

## 2022-05-13 DIAGNOSIS — Z13228 Encounter for screening for other metabolic disorders: Secondary | ICD-10-CM | POA: Diagnosis not present

## 2022-05-13 DIAGNOSIS — Z130 Encounter for screening for diseases of the blood and blood-forming organs and certain disorders involving the immune mechanism: Secondary | ICD-10-CM | POA: Diagnosis not present

## 2022-05-13 DIAGNOSIS — Z131 Encounter for screening for diabetes mellitus: Secondary | ICD-10-CM

## 2022-05-13 DIAGNOSIS — Z0000 Encounter for general adult medical examination without abnormal findings: Secondary | ICD-10-CM | POA: Diagnosis not present

## 2022-05-13 DIAGNOSIS — Z13220 Encounter for screening for lipoid disorders: Secondary | ICD-10-CM

## 2022-05-13 DIAGNOSIS — Z1329 Encounter for screening for other suspected endocrine disorder: Secondary | ICD-10-CM

## 2022-05-13 NOTE — Progress Notes (Signed)
.  Pt presents for annual physical exam   -patient states that sleep pattern has changed where she is not staying asleep  -pap completed 03/09/21 w/Michelle Vincente Poli

## 2022-05-13 NOTE — Patient Instructions (Signed)

## 2022-05-14 LAB — HEMOGLOBIN A1C
Est. average glucose Bld gHb Est-mCnc: 114 mg/dL
Hgb A1c MFr Bld: 5.6 % (ref 4.8–5.6)

## 2022-05-14 LAB — CBC
Hematocrit: 40.1 % (ref 34.0–46.6)
Hemoglobin: 13.5 g/dL (ref 11.1–15.9)
MCH: 30.5 pg (ref 26.6–33.0)
MCHC: 33.7 g/dL (ref 31.5–35.7)
MCV: 91 fL (ref 79–97)
Platelets: 245 10*3/uL (ref 150–450)
RBC: 4.42 x10E6/uL (ref 3.77–5.28)
RDW: 12.3 % (ref 11.7–15.4)
WBC: 3.4 10*3/uL (ref 3.4–10.8)

## 2022-05-14 LAB — CMP14+EGFR
ALT: 19 IU/L (ref 0–32)
AST: 20 IU/L (ref 0–40)
Albumin/Globulin Ratio: 2 (ref 1.2–2.2)
Albumin: 4.7 g/dL (ref 3.9–4.9)
Alkaline Phosphatase: 59 IU/L (ref 44–121)
BUN/Creatinine Ratio: 23 (ref 9–23)
BUN: 16 mg/dL (ref 6–20)
Bilirubin Total: 0.2 mg/dL (ref 0.0–1.2)
CO2: 23 mmol/L (ref 20–29)
Calcium: 9.4 mg/dL (ref 8.7–10.2)
Chloride: 103 mmol/L (ref 96–106)
Creatinine, Ser: 0.71 mg/dL (ref 0.57–1.00)
Globulin, Total: 2.3 g/dL (ref 1.5–4.5)
Glucose: 76 mg/dL (ref 70–99)
Potassium: 4.2 mmol/L (ref 3.5–5.2)
Sodium: 141 mmol/L (ref 134–144)
Total Protein: 7 g/dL (ref 6.0–8.5)
eGFR: 117 mL/min/{1.73_m2} (ref >59)

## 2022-05-14 LAB — TSH: TSH: 0.57 u[IU]/mL (ref 0.450–4.500)

## 2022-05-14 LAB — LIPID PANEL
Chol/HDL Ratio: 2.6 ratio (ref 0.0–4.4)
Cholesterol, Total: 163 mg/dL (ref 100–199)
HDL: 63 mg/dL (ref >39)
LDL Chol Calc (NIH): 88 mg/dL (ref 0–99)
Triglycerides: 59 mg/dL (ref 0–149)
VLDL Cholesterol Cal: 12 mg/dL (ref 5–40)

## 2022-08-02 NOTE — Patient Instructions (Signed)
Below is our plan:  We will continue Vimpat 50mg  in morning and 100mg  at bedtime, topiramate 50mg  in morning and 100mg  at bedtime and clonazepam 0.25 (1/2 tablet) at bedtime. We will update labs, today.   Please make sure you are consistent with timing of seizure medication. I recommend annual visit with primary care provider (PCP) for complete physical and routine blood work. I recommend daily intake of vitamin D (400-800iu) and calcium (800-1000mg ) for bone health. Discuss Dexa screening with PCP.   According to [place] law, you can not drive unless you are seizure / syncope free for at least 6 months and under physician's care.  Please maintain precautions. Do not participate in activities where a loss of awareness could harm you or someone else. No swimming alone, no tub bathing, no hot tubs, no driving, no operating motorized vehicles (cars, ATVs, motocycles, etc), lawnmowers, power tools or firearms. No standing at heights, such as rooftops, ladders or stairs. Avoid hot objects such as stoves, heaters, open fires. Wear a helmet when riding a bicycle, scooter, skateboard, etc. and avoid areas of traffic. Set your water heater to 120 degrees or less.  Please make sure you are staying well hydrated. I recommend 50-60 ounces daily. Well balanced diet and regular exercise encouraged. Consistent sleep schedule with 6-8 hours recommended.   Please continue follow up with care team as directed.   Follow up with me in 1 year   You may receive a survey regarding today's visit. I encourage you to leave honest feed back as I do use this information to improve patient care. Thank you for seeing me today!

## 2022-08-02 NOTE — Progress Notes (Addendum)
PATIENT: Jennifer Luna DOB: September 02, 1990  REASON FOR VISIT: follow up HISTORY FROM: patient  Chief Complaint  Patient presents with   Room 16    Pt is here Alone. Pt states that things have been going pretty good since last appointment no new changes.      HISTORY OF PRESENT ILLNESS:  08/04/22 ALL: Jennifer Luna returns for follow up for epilepsy. She continues lacosamide 50mg  in am and 100mg  at bedtime, topiramate 50mg  in am and 100mg  at bedtime, zonisamide 50mg  at bedtime and clonazepam 0.25 (1/2 tablet) at bedtime. Vimpat level was low at previous visit with Dr Brett Fairy 07/2021. She admitted to not being consistent with dosing. She reports doing well. She is taking meds daily as prescribed. She is tlerating it well. She thinks she is receiving Vimpat brand, not generic. She has taken lacosamide in the past and tolerated it well. Last seizure in 2013. She is working full time. She drives without difficulty. She is able to maintain her home.   09/11/2019 ALL: Jennifer Luna is a 32 y.o. female here today for follow up for epilepsy. She continues Vimpat 50mg  in am and 100mg  in pm as well as topiramate 50mg  in am and 100mg  in pm. She is taking clonazepam 0.25mg  at bedtime. She is doing well. She is driving without difficulty. She is working from home, part time. She has had both COVID vaccines. She denies side effects with vaccine. She has follow up with GYN in June.   HISTORY: (copied from Dr Dohmeier's note on 03/12/2019)  Interval history for [facility], a patient I have followed since 2011. Jennifer Luna is currently taking Vimpat 100 mg by mouth twice a day, topiramate 100 mg twice a day and Klonopin at bedtime. She continues to be well controlled. Her last seizure occurred in 2013. 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 She will loose health insurance through her parents in October, wants her  refills on 90 day bases now. CMET ordered.    11-05-2015,Ms. Whan is a 32 year old female with a history of seizures. She continues to take Vimpat, Topamax and clonazepam. She reports that she is not had any seizures since the last visit. She is able to operate a motor vehicle without difficulty. She is able to complete all ADLs independently. She states that her seizures typically consist of staring spells that last for several seconds. She denies any changes in her mood or behavior. She does state that she's recently been very tired. She states that she works 4 hours a day and is also in [employer]. She states that lately she has been very busy. She denies any changes with her walking or balance. However her mother speaks up and states that she is very stiff for a 32 year old.  She states that when she is gets out of bed in the morning she does feel stiff in her joints specifically the hips and knees. She states that when she gets going this improves.  The mother states that she has fibromyalgia and is wondering if some of the symptoms that her daughter has maybe related to fibromyalgia as well. She returns today to obtain refills for her antiepileptic medication. She also described recently a spell where she had some myoclonic jerking in the right elbow and right knee, she was out in public and left the event concerned that this may proceed a seizure but this did not happen.  Her last recorded seizures from 2013. Mena has been driving, she is currently in summer break from school.   HISTORY 10/29/14:  Jennifer Luna is a 32 y.o. female  Is seen here as a  revisit  from Dr. Everlene Farrier. She has been one year seizure free and driving. She still has occasional little absence "blurps" but she has not had the loss of conversational flow and the secondary generalization. She had onset of seizures at age 58 . Her mother described the toddler coming up to her and sudden eye flutter and staring off, unresponsive.     Her last  seizure in 2013 , April.    Driving to work, has 2 part time jobs. She has developed well, has become much more independent and can drive.    She's taking 2 Vimpat 100 mg by mouth twice a day, she's taking Topiramate 100 mg bid po.   She's taking Klonopin or 0.5 mg a half tablet by mouth at bedtime.   She has been informed about a VNS treatment option, she doesn't want now.    REVIEW OF SYSTEMS: Out of a complete 14 system review of symptoms, the patient complains only of the following symptoms, seizure and all other reviewed systems are negative.  ALLERGIES: Allergies  Allergen Reactions   Penicillins Rash    Has patient had a PCN reaction causing immediate rash, facial/tongue/throat swelling, SOB or lightheadedness with hypotension: No  Has patient had a PCN reaction causing severe rash involving mucus membranes or skin necrosis: Yes  Has patient had a PCN reaction that required hospitalization No  Has patient had a PCN reaction occurring within the last 10 years: No  If all of the above answers are "NO", then may proceed with Cephalosporin use.   Sulfa Antibiotics Nausea Only, Rash and Other (See Comments)   Penicillin G Nausea And Vomiting   Erythromycin Rash and Nausea And Vomiting    HOME MEDICATIONS: Outpatient Medications Prior to Visit  Medication Sig Dispense Refill   Ascorbic Acid (VITAMIN C PO) Take 1 tablet by mouth daily.     drospirenone-ethinyl estradiol (YAZ,GIANVI,LORYNA) 3-0.02 MG tablet Take 1 tablet by mouth daily.   12   fluticasone (FLONASE) 50 MCG/ACT nasal spray Place 2 sprays into both nostrils daily. 16 g 0   ibuprofen (ADVIL,MOTRIN) 600 MG tablet Take 1 tablet (600 mg total) by mouth every 6 (six) hours as needed. 30 tablet 0   ketoconazole (NIZORAL) 2 % shampoo Apply 1 application topically 2 (two) times a week. 120 mL 0   promethazine-dextromethorphan (PROMETHAZINE-DM) 6.25-15 MG/5ML syrup Take 2.5 mLs by mouth 3 (three) times daily as  needed for cough. 118 mL 0   zonisamide (ZONEGRAN) 25 MG capsule Take 2 capsules (50 mg total) by mouth at bedtime. 180 capsule 3   clonazePAM (KLONOPIN) 0.5 MG tablet TAKE 1/2 (ONE-HALF) TABLET BY MOUTH AT BEDTIME 30 tablet 0   lacosamide (VIMPAT) 50 MG TABS tablet TAKE 1 TABLET BY MOUTH ONCE DAILY IN THE MORNING AND 2 AT BEDTIME 270 tablet 1   topiramate (TOPAMAX) 100 MG tablet 50 mg(0.5 tablets)  in AM and 100 mg (1 whole tablet) in PM by mouth. 270 tablet 3   No facility-administered medications prior to visit.    PAST MEDICAL HISTORY: Past Medical History:  Diagnosis Date   Amenorrhea 01/11/2012   notes from office visit, PERIMENTRUAL PAINS   Arthralgia 04/23/2013   Epilepsy ([place])    with absence seizures, abnormal EEG  Lump in neck 08/2017   Seizures ([facility])    nx of absent and grand mal   Shortness of breath 04/23/2013   Sleepiness 01/11/2012   notes from office visit   Sleepiness    DAYTIME   Thrombocytopenia ([place]) 01/11/12   office notes from last visit, with zarontin and depakote    PAST SURGICAL HISTORY: Past Surgical History:  Procedure Laterality Date   shoulder injury      FAMILY HISTORY: Family History  Problem Relation Age of Onset   Hypothyroidism Mother    Arthritis Mother    Hypertension Father    Diabetes Father    Hyperlipidemia Father    Breast cancer Maternal Grandmother    Heart disease Maternal Grandmother    Hypertension Maternal Grandmother    Heart disease Maternal Grandfather    Heart disease Paternal Grandmother    Rheum arthritis Paternal Grandmother    Asthma Sister    Lupus Maternal Aunt    Seizures Paternal Uncle        as a child   Rheum arthritis Maternal Aunt    Asthma Other        Maternal Great Uncle    Asthma Sister        had as a child    SOCIAL HISTORY: Social History   Socioeconomic History   Marital status: Single    Spouse name: Not on file   Number of children: 0   Years of education: 12   Highest  education level: Not on file  Occupational History   Occupation: Geographical information systems officer: UNEMPLOYED   Occupation: Lexicographer: UNEMPLOYED  Tobacco Use   Smoking status: Never    Passive exposure: Never   Smokeless tobacco: Never  Vaping Use   Vaping Use: Never used  Substance and Sexual Activity   Alcohol use: No   Drug use: No   Sexual activity: Not on file  Other Topics Concern   Not on file  Social History Narrative   Patient is single and lives at home with her parents.   Patient is working part-time.   Patient has a high school education.   Patient is right-handed.   Patient drinks about 1-2 cups of hot tea daily   Social Determinants of Health   Financial Resource Strain: Not on file  Food Insecurity: Not on file  Transportation Needs: Not on file  Physical Activity: Not on file  Stress: Not on file  Social Connections: Not on file  Intimate Partner Violence: Not on file      PHYSICAL EXAM  Vitals:   08/04/22 0935  BP: 128/87  Pulse: 80  Weight: 183 lb 3.2 oz (83.1 kg)  Height: 5\' 5"  (1.651 m)    Body mass index is 30.49 kg/m.  Generalized: Well developed, in no acute distress  Cardiology: normal rate and rhythm, no murmur noted Respiratory: clear to auscultation bilaterally  Neurological examination  Mentation: Alert oriented to time, place, history taking. Follows all commands speech and language fluent Cranial nerve II-XII: Pupils were equal round reactive to light. Extraocular movements were full Motor: The motor testing reveals 5 over 5 strength of all 4 extremities. Good symmetric motor tone is noted throughout.  Sensory: Sensory testing is intact to soft touch on all 4 extremities. No evidence of extinction is noted.  Gait and station: Gait is normal.   DIAGNOSTIC DATA (LABS, IMAGING, TESTING) - I reviewed patient records, labs, notes, testing and imaging myself where  available.      No data to display           Lab  Results  Component Value Date   WBC 3.4 05/13/2022   HGB 13.5 05/13/2022   HCT 40.1 05/13/2022   MCV 91 05/13/2022   PLT 245 05/13/2022      Component Value Date/Time   NA 141 05/13/2022 1157   K 4.2 05/13/2022 1157   CL 103 05/13/2022 1157   CO2 23 05/13/2022 1157   GLUCOSE 76 05/13/2022 1157   GLUCOSE 102 (H) 12/14/2017 1614   BUN 16 05/13/2022 1157   CREATININE 0.71 05/13/2022 1157   CREATININE 0.74 04/24/2013 1605   CALCIUM 9.4 05/13/2022 1157   PROT 7.0 05/13/2022 1157   ALBUMIN 4.7 05/13/2022 1157   AST 20 05/13/2022 1157   ALT 19 05/13/2022 1157   ALKPHOS 59 05/13/2022 1157   BILITOT 0.2 05/13/2022 1157   GFRNONAA 101 05/12/2020 1118   GFRAA 117 05/12/2020 1118   Lab Results  Component Value Date   CHOL 163 05/13/2022   HDL 63 05/13/2022   LDLCALC 88 05/13/2022   TRIG 59 05/13/2022   CHOLHDL 2.6 05/13/2022   Lab Results  Component Value Date   HGBA1C 5.6 05/13/2022   Lab Results  Component Value Date   VITAMINB12 911 04/24/2013   Lab Results  Component Value Date   TSH 0.570 05/13/2022     ASSESSMENT AND PLAN 32 y.o. year old female  has a past medical history of Amenorrhea (01/11/2012), Arthralgia (04/23/2013), Epilepsy ([place]), Lump in neck (08/2017), Seizures ([place]), Shortness of breath (04/23/2013), Sleepiness (01/11/2012), Sleepiness, and Thrombocytopenia ([place]) (01/11/12). here with     ICD-10-CM   1. Epilepsy, generalized, convulsive ([place])  G40.309 Lacosamide    2. Seizures (HCC)  R56.9 topiramate (TOPAMAX) 100 MG tablet       Andreas is doing well, today. Per pharmacy, patient has been receiving generic lacosamide for the past year. She continues lacosamide, topiramate, zonisamide and clonazepam as prescribed and is tolerating well. PMP aware and refills are appropriate. She continues to be independent and driving without difficult. We will update labs. She will stay well hydrated. Focus on healthy lifestyle habits. She will follow up with PCP and  GYN as advised. Follow up with [place] in 1 year. She verbalizes understanding and agreement with this plan.     Orders Placed This Encounter  Procedures   Lacosamide     Meds ordered this encounter  Medications   topiramate (TOPAMAX) 100 MG tablet    Sig: 50 mg(0.5 tablets)  in AM and 100 mg (1 whole tablet) in PM by mouth.    Dispense:  270 tablet    Refill:  3    Order Specific Question:   Supervising Provider    Answer:   Melvenia Beam [id number]   clonazePAM (KLONOPIN) 0.5 MG tablet    Sig: TAKE 1/2 (ONE-HALF) TABLET BY MOUTH AT BEDTIME    Dispense:  45 tablet    Refill:  1    Order Specific Question:   Supervising Provider    Answer:   Melvenia Beam [id number]   lacosamide (VIMPAT) 50 MG TABS tablet    Sig: TAKE 1 TABLET BY MOUTH ONCE DAILY IN THE MORNING AND 2 AT BEDTIME    Dispense:  270 tablet    Refill:  1    Order Specific Question:   Supervising Provider    Answer:   Melvenia Beam [id number]  Debbora Presto, FNP-C 08/04/2022, 11:19 AM [facility] [address], [place], [place] [address] [phone]

## 2022-08-04 ENCOUNTER — Encounter: Payer: Self-pay | Admitting: Family Medicine

## 2022-08-04 ENCOUNTER — Ambulatory Visit: Payer: BC Managed Care – PPO | Admitting: Family Medicine

## 2022-08-04 VITALS — BP 128/87 | HR 80 | Ht 65.0 in | Wt 183.2 lb

## 2022-08-04 DIAGNOSIS — G40309 Generalized idiopathic epilepsy and epileptic syndromes, not intractable, without status epilepticus: Secondary | ICD-10-CM

## 2022-08-04 DIAGNOSIS — R569 Unspecified convulsions: Secondary | ICD-10-CM

## 2022-08-04 MED ORDER — CLONAZEPAM 0.5 MG PO TABS: ORAL_TABLET | ORAL | 1 refills | Status: DC

## 2022-08-04 MED ORDER — TOPIRAMATE 100 MG PO TABS: ORAL_TABLET | ORAL | 3 refills | Status: DC

## 2022-08-04 MED ORDER — LACOSAMIDE 50 MG PO TABS: ORAL_TABLET | ORAL | 1 refills | Status: DC

## 2022-08-04 NOTE — Progress Notes (Signed)
I called [facility] and pt is getting generic.

## 2022-08-04 NOTE — Addendum Note (Signed)
Addended byUbaldo Glassing, Swain Acree L on: 08/04/2022 11:19 AM   Modules accepted: Orders

## 2022-08-10 ENCOUNTER — Telehealth: Payer: Self-pay | Admitting: Family Medicine

## 2022-08-10 ENCOUNTER — Telehealth: Payer: Self-pay | Admitting: *Deleted

## 2022-08-10 DIAGNOSIS — R569 Unspecified convulsions: Secondary | ICD-10-CM

## 2022-08-10 LAB — LACOSAMIDE: Lacosamide: <0.5 ug/mL — ABNORMAL LOW (ref 5.0–10.0)

## 2022-08-10 MED ORDER — ZONISAMIDE 25 MG PO CAPS: 50.0000 mg | ORAL_CAPSULE | Freq: Every day | ORAL | 2 refills | Status: DC

## 2022-08-10 NOTE — Telephone Encounter (Signed)
Pt called wanting to know when her  zonisamide (ZONEGRAN) 25 MG capsule will be sent to the [facility] since it was sent to the [facility] earlier.

## 2022-08-10 NOTE — Telephone Encounter (Signed)
Jennifer Luna pt was not able to response to your message:  "Good afternoon! I have been waiting on your lacosamide level to return. The level remains low. Dr Dohmeier had recommended increasing the dose but you had not take consistently. Have you taken Vimpat (lacosamide) twice daily, every day as prescribed? I am reassured that your seizures are well managed at this time but wanted to make sure that you are taking the medication as we discussed in office. "  She did admit not being consistent with taking her Vimpat twice daily, she has missed doses " here and there" pt was not able to tell me if it was 1 week or 2 weeks missed etc. She is at the pharmacy now and states she will try to do better about taking medication.

## 2022-08-10 NOTE — Telephone Encounter (Signed)
I called patient to see if she still taking Rx, pt said she still does take Rx. She is currently out of refills.  I called Debbora Presto, NP cell phone and she was fine for me to refill Rx for patient Rx sent.  Pt last seen on 08/04/22 Follow up scheduled on 08/08/22

## 2022-08-11 ENCOUNTER — Ambulatory Visit: Payer: Managed Care, Other (non HMO) | Admitting: Family Medicine

## 2022-09-23 NOTE — Progress Notes (Unsigned)
Patient ID: Jennifer Luna, female    DOB: 02/13/1991  MRN: [id number]  CC: Skin Concerns   Subjective: Jennifer Luna is a 32 y.o. female who presents for skin concerns.  Her concerns today include:  Reports on last week she noticed spots on her chest and stomach. Reports the spots on her face have been there for a while and over time have become darker. She denies associated symptoms with spots. She has not changed skin care regimen. She does not have any family history of skin issues. She would like a referral to Dermatology. No further issues/concerns for discussion today.  Patient Active Problem List   Diagnosis Date Noted   Generalized idiopathic epilepsy and epileptic syndromes, not intractable, without status epilepticus (HCC) 03/12/2020   Absence epileptic syndrome (HCC) 03/12/2020   Adjustment insomnia 03/12/2020   Localization-related idiopathic epilepsy and epileptic syndromes with seizures of localized onset, not intractable, with status epilepticus (HCC) 09/06/2018   Complex febrile convulsion (HCC) 11/05/2015   Epilepsy, generalized, convulsive (HCC) 11/05/2015   Epilepsy (HCC) 04/28/2014   Arthralgia 04/23/2013   Shortness of breath 04/23/2013   Intractable absence epilepsy (HCC) 09/21/2012   Pain in joint, shoulder region 09/21/2012     Current Outpatient Medications on File Prior to Visit  Medication Sig Dispense Refill   Ascorbic Acid (VITAMIN C PO) Take 1 tablet by mouth daily.     clonazePAM (KLONOPIN) 0.5 MG tablet TAKE 1/2 (ONE-HALF) TABLET BY MOUTH AT BEDTIME 45 tablet 1   drospirenone-ethinyl estradiol (YAZ,GIANVI,LORYNA) 3-0.02 MG tablet Take 1 tablet by mouth daily.   12   fluticasone (FLONASE) 50 MCG/ACT nasal spray Place 2 sprays into both nostrils daily. 16 g 0   ibuprofen (ADVIL,MOTRIN) 600 MG tablet Take 1 tablet (600 mg total) by mouth every 6 (six) hours as needed. 30 tablet 0   ketoconazole (NIZORAL) 2 % shampoo Apply 1 application topically  2 (two) times a week. 120 mL 0   lacosamide (VIMPAT) 50 MG TABS tablet TAKE 1 TABLET BY MOUTH ONCE DAILY IN THE MORNING AND 2 AT BEDTIME 270 tablet 1   topiramate (TOPAMAX) 100 MG tablet 50 mg(0.5 tablets)  in AM and 100 mg (1 whole tablet) in PM by mouth. 270 tablet 3   zonisamide (ZONEGRAN) 25 MG capsule Take 2 capsules (50 mg total) by mouth at bedtime. 180 capsule 2   No current facility-administered medications on file prior to visit.    Allergies  Allergen Reactions   Penicillins Rash    Has patient had a PCN reaction causing immediate rash, facial/tongue/throat swelling, SOB or lightheadedness with hypotension: No  Has patient had a PCN reaction causing severe rash involving mucus membranes or skin necrosis: Yes  Has patient had a PCN reaction that required hospitalization No  Has patient had a PCN reaction occurring within the last 10 years: No  If all of the above answers are "NO", then may proceed with Cephalosporin use.   Sulfa Antibiotics Nausea Only, Rash and Other (See Comments)   Penicillin G Nausea And Vomiting   Erythromycin Rash and Nausea And Vomiting    Social History   Socioeconomic History   Marital status: Single    Spouse name: Not on file   Number of children: 0   Years of education: 12   Highest education level: 12th grade  Occupational History   Occupation: Research officer, political party: UNEMPLOYED   Occupation: Dentist: UNEMPLOYED  Tobacco Use  Smoking status: Never    Passive exposure: Never   Smokeless tobacco: Never  Vaping Use   Vaping Use: Never used  Substance and Sexual Activity   Alcohol use: No   Drug use: No   Sexual activity: Not on file  Other Topics Concern   Not on file  Social History Narrative   Patient is single and lives at home with her parents.   Patient is working part-time.   Patient has a high school education.   Patient is right-handed.   Patient drinks about 1-2 cups of hot tea daily   Social  Determinants of Health   Financial Resource Strain: Low Risk  (09/23/2022)   Overall Financial Resource Strain (CARDIA)    Difficulty of Paying Living Expenses: Not very hard  Food Insecurity: No Food Insecurity (09/23/2022)   Hunger Vital Sign    Worried About Running Out of Food in the Last Year: Never true    Ran Out of Food in the Last Year: Never true  Transportation Needs: No Transportation Needs (09/23/2022)   PRAPARE - Administrator, Civil Service (Medical): No    Lack of Transportation (Non-Medical): No  Physical Activity: Unknown (09/23/2022)   Exercise Vital Sign    Days of Exercise per Week: Patient declined    Minutes of Exercise per Session: Not on file  Stress: No Stress Concern Present (09/23/2022)   [employer] of Occupational Health - Occupational Stress Questionnaire    Feeling of Stress : Not at all  Social Connections: Moderately Isolated (09/23/2022)   Social Connection and Isolation Panel [NHANES]    Frequency of Communication with Friends and Family: More than three times a week    Frequency of Social Gatherings with Friends and Family: More than three times a week    Attends Religious Services: More than 4 times per year    Active Member of [employer] or Organizations: No    Attends Engineer, structural: Not on file    Marital Status: Never married  Catering manager Violence: Not on file    Family History  Problem Relation Age of Onset   Hypothyroidism Mother    Arthritis Mother    Hypertension Father    Diabetes Father    Hyperlipidemia Father    Breast cancer Maternal Grandmother    Heart disease Maternal Grandmother    Hypertension Maternal Grandmother    Heart disease Maternal Grandfather    Heart disease Paternal Grandmother    Rheum arthritis Paternal Grandmother    Asthma Sister    Lupus Maternal Aunt    Seizures Paternal Uncle        as a child   Rheum arthritis Maternal Aunt    Asthma Other        Maternal Great Uncle     Asthma Sister        had as a child    Past Surgical History:  Procedure Laterality Date   shoulder injury      ROS: Review of Systems Negative except as stated above  PHYSICAL EXAM: BP 105/71 (BP Location: Right Arm, Patient Position: Sitting, Cuff Size: Large)   Pulse 82   Temp 98.7 F (37.1 C) (Oral)   Resp 16   Wt 180 lb (81.6 kg)   LMP 09/01/2022 (Approximate)   SpO2 97%   BMI 29.95 kg/m   Physical Exam HENT:     Head: Normocephalic and atraumatic.  Eyes:     Extraocular Movements: Extraocular movements intact.  Conjunctiva/sclera: Conjunctivae normal.     Pupils: Pupils are equal, round, and reactive to light.  Cardiovascular:     Rate and Rhythm: Normal rate and regular rhythm.     Pulses: Normal pulses.     Heart sounds: Normal heart sounds.  Pulmonary:     Effort: Pulmonary effort is normal.     Breath sounds: Normal breath sounds.  Musculoskeletal:     Cervical back: Normal range of motion and neck supple.  Skin:    General: Skin is warm and dry.     Findings: Rash present.     Comments: Hyperpigmented macular rash of bilateral cheekbones with no additional presentation.  Neurological:     General: No focal deficit present.     Mental Status: She is alert and oriented to person, place, and time.  Psychiatric:        Mood and Affect: Mood normal.        Behavior: Behavior normal.      ASSESSMENT AND PLAN: 1. Rash and nonspecific skin eruption - Referral to Dermatology for further evaluation/management.  - Ambulatory referral to Dermatology   Patient was given the opportunity to ask questions.  Patient verbalized understanding of the plan and was able to repeat key elements of the plan. Patient was given clear instructions to go to Emergency Department or return to medical center if symptoms don't improve, worsen, or new problems develop.The patient verbalized understanding.   Orders Placed This Encounter  Procedures   Ambulatory  referral to Dermatology    Follow-up with primary provider as scheduled.   Rema Fendt, NP

## 2022-09-27 ENCOUNTER — Encounter: Payer: Self-pay | Admitting: Family

## 2022-09-27 ENCOUNTER — Ambulatory Visit (INDEPENDENT_AMBULATORY_CARE_PROVIDER_SITE_OTHER): Payer: BC Managed Care – PPO | Admitting: Family

## 2022-09-27 VITALS — BP 105/71 | HR 82 | Temp 98.7°F | Resp 16 | Wt 180.0 lb

## 2022-09-27 DIAGNOSIS — R21 Rash and other nonspecific skin eruption: Secondary | ICD-10-CM

## 2022-09-27 NOTE — Progress Notes (Signed)
Noticed spots on face and body last week

## 2022-12-23 ENCOUNTER — Telehealth: Payer: Self-pay | Admitting: Family Medicine

## 2022-12-23 NOTE — Telephone Encounter (Signed)
Pt would now like to get her  lacosamide (VIMPAT) 50 MG TABS tablet,  90 day thru mail order by [facility] [phone]

## 2022-12-26 MED ORDER — LACOSAMIDE 50 MG PO TABS: ORAL_TABLET | ORAL | 1 refills | Status: DC

## 2022-12-26 NOTE — Telephone Encounter (Signed)
Pt last seen on 08/04/22 Follow up scheduled on 08/08/23   Amy can you send Rx to be sent to mail order per patient request. Rx is pending to be signed

## 2022-12-27 NOTE — Telephone Encounter (Signed)
Called [facility] back and gave them Dr. Satira Anis # she is co-signer on Rx for Vimpat.

## 2022-12-27 NOTE — Telephone Encounter (Signed)
[facility] called and LVM stating that they are needing the Rx for the pt's Vimpat with the DEA number of the authorizing prescriber unless NP has Independent prescriptive authority. Please call back [phone] Option 2 REF# [phone]

## 2023-01-06 ENCOUNTER — Telehealth: Payer: BC Managed Care – PPO | Admitting: Physician Assistant

## 2023-01-06 DIAGNOSIS — R058 Other specified cough: Secondary | ICD-10-CM | POA: Diagnosis not present

## 2023-01-06 MED ORDER — PREDNISONE 20 MG PO TABS: 40.0000 mg | ORAL_TABLET | Freq: Every day | ORAL | 0 refills | Status: DC

## 2023-01-06 NOTE — Progress Notes (Signed)
E-Visit for Cough  We are sorry that you are not feeling well.  Here is how we plan to help!  Based on your presentation I believe you most likely have post-viral cough syndrome. This is a chronic cough after a moderate upper respiratory infection that is caused from residual inflammation in the airway from the previous infection.   In addition you may use A non-prescription cough medication called Mucinex DM: take 2 tablets every 12 hours.  Prednisone 20mg  Take 2 tablets (40mg ) daily for 5 days  From your responses in the eVisit questionnaire you describe inflammation in the upper respiratory tract which is causing a significant cough.  This is commonly called Bronchitis and has four common causes:   Allergies Viral Infections Acid Reflux Bacterial Infection Allergies, viruses and acid reflux are treated by controlling symptoms or eliminating the cause. An example might be a cough caused by taking certain blood pressure medications. You stop the cough by changing the medication. Another example might be a cough caused by acid reflux. Controlling the reflux helps control the cough.  USE OF BRONCHODILATOR ("RESCUE") INHALERS: There is a risk from using your bronchodilator too frequently.  The risk is that over-reliance on a medication which only relaxes the muscles surrounding the breathing tubes can reduce the effectiveness of medications prescribed to reduce swelling and congestion of the tubes themselves.  Although you feel brief relief from the bronchodilator inhaler, your asthma may actually be worsening with the tubes becoming more swollen and filled with mucus.  This can delay other crucial treatments, such as oral steroid medications. If you need to use a bronchodilator inhaler daily, several times per day, you should discuss this with your provider.  There are probably better treatments that could be used to keep your asthma under control.     HOME CARE Only take medications as  instructed by your medical team. Complete the entire course of an antibiotic. Drink plenty of fluids and get plenty of rest. Avoid close contacts especially the very young and the elderly Cover your mouth if you cough or cough into your sleeve. Always remember to wash your hands A steam or ultrasonic humidifier can help congestion.   GET HELP RIGHT AWAY IF: You develop worsening fever. You become short of breath You cough up blood. Your symptoms persist after you have completed your treatment plan MAKE SURE YOU  Understand these instructions. Will watch your condition. Will get help right away if you are not doing well or get worse.    Thank you for choosing an e-visit.  Your e-visit answers were reviewed by a board certified advanced clinical practitioner to complete your personal care plan. Depending upon the condition, your plan could have included both over the counter or prescription medications.  Please review your pharmacy choice. Make sure the pharmacy is open so you can pick up prescription now. If there is a problem, you may contact your provider through [employer] and have the prescription routed to another pharmacy.  Your safety is important to [place]. If you have drug allergies check your prescription carefully.   For the next 24 hours you can use MyChart to ask questions about today's visit, request a non-urgent call back, or ask for a work or school excuse. You will get an email in the next two days asking about your experience. I hope that your e-visit has been valuable and will speed your recovery.  I have spent 5 minutes in review of e-visit questionnaire, review and  updating patient chart, medical decision making and response to patient.   Margaretann Loveless, PA-C

## 2023-01-10 ENCOUNTER — Telehealth: Payer: Self-pay | Admitting: Family

## 2023-01-10 NOTE — Telephone Encounter (Signed)
Called pt and left vm to call office back to schedule appt requested via MyChart for persistent cough.

## 2023-01-11 ENCOUNTER — Ambulatory Visit (INDEPENDENT_AMBULATORY_CARE_PROVIDER_SITE_OTHER): Payer: BC Managed Care – PPO | Admitting: Family Medicine

## 2023-01-11 ENCOUNTER — Encounter: Payer: Self-pay | Admitting: Family Medicine

## 2023-01-11 VITALS — BP 118/85 | HR 101 | Temp 97.6°F | Resp 16 | Wt 173.0 lb

## 2023-01-11 DIAGNOSIS — R141 Gas pain: Secondary | ICD-10-CM | POA: Insufficient documentation

## 2023-01-11 DIAGNOSIS — R058 Other specified cough: Secondary | ICD-10-CM | POA: Diagnosis not present

## 2023-01-11 DIAGNOSIS — R194 Change in bowel habit: Secondary | ICD-10-CM | POA: Insufficient documentation

## 2023-01-11 DIAGNOSIS — R053 Chronic cough: Secondary | ICD-10-CM

## 2023-01-11 DIAGNOSIS — R112 Nausea with vomiting, unspecified: Secondary | ICD-10-CM | POA: Insufficient documentation

## 2023-01-11 MED ORDER — BENZONATATE 100 MG PO CAPS: 100.0000 mg | ORAL_CAPSULE | Freq: Two times a day (BID) | ORAL | 1 refills | Status: DC | PRN

## 2023-01-11 NOTE — Progress Notes (Unsigned)
Patient tested positive for COVID in July. Patient has a  cough  that she is not able to get rid of after taking prednisone.

## 2023-01-12 ENCOUNTER — Encounter: Payer: Self-pay | Admitting: Family Medicine

## 2023-01-12 NOTE — Progress Notes (Signed)
Established Patient Office Visit  Subjective    Patient ID: Jennifer Luna, female    DOB: [birth year]  Age: 32 y.o. MRN: [id number]  CC:  Chief Complaint  Patient presents with   Cough    HPI Jennifer Luna presents for complaint of persistent cough since having covid for the 3rd time dx endo of July. She denies ongoing viral sx.    Outpatient Encounter Medications as of 01/11/2023  Medication Sig   Ascorbic Acid (VITAMIN C PO) Take 1 tablet by mouth daily.   benzonatate (TESSALON) 100 MG capsule Take 1 capsule (100 mg total) by mouth 2 (two) times daily as needed for cough.   clonazePAM (KLONOPIN) 0.5 MG tablet TAKE 1/2 (ONE-HALF) TABLET BY MOUTH AT BEDTIME   drospirenone-ethinyl estradiol (YAZ,GIANVI,LORYNA) 3-0.02 MG tablet Take 1 tablet by mouth daily.    fluticasone (FLONASE) 50 MCG/ACT nasal spray Place 2 sprays into both nostrils daily.   ibuprofen (ADVIL,MOTRIN) 600 MG tablet Take 1 tablet (600 mg total) by mouth every 6 (six) hours as needed.   ketoconazole (NIZORAL) 2 % shampoo Apply 1 application topically 2 (two) times a week.   lacosamide (VIMPAT) 50 MG TABS tablet TAKE 1 TABLET BY MOUTH ONCE DAILY IN THE MORNING AND 2 AT BEDTIME   topiramate (TOPAMAX) 100 MG tablet 50 mg(0.5 tablets)  in AM and 100 mg (1 whole tablet) in PM by mouth.   zonisamide (ZONEGRAN) 25 MG capsule Take 2 capsules (50 mg total) by mouth at bedtime.   mometasone (ELOCON) 0.1 % cream APPLY CREAM TOPICALLY TO AFFECTED AREA ONCE DAILY AS NEEDED FOR ITCH   [DISCONTINUED] predniSONE (DELTASONE) 20 MG tablet Take 2 tablets (40 mg total) by mouth daily with breakfast.   No facility-administered encounter medications on file as of 01/11/2023.    Past Medical History:  Diagnosis Date   Amenorrhea 01/11/2012   notes from office visit, PERIMENTRUAL PAINS   Arthralgia 04/23/2013   Epilepsy (HCC)    with absence seizures, abnormal EEG   Lump in neck 08/2017   Seizures (HCC)    nx of absent and  grand mal   Shortness of breath 04/23/2013   Sleepiness 01/11/2012   notes from office visit   Sleepiness    DAYTIME   Thrombocytopenia (HCC) 01/11/12   office notes from last visit, with zarontin and depakote    Past Surgical History:  Procedure Laterality Date   shoulder injury      Family History  Problem Relation Age of Onset   Hypothyroidism Mother    Arthritis Mother    Hypertension Father    Diabetes Father    Hyperlipidemia Father    Breast cancer Maternal Grandmother    Heart disease Maternal Grandmother    Hypertension Maternal Grandmother    Heart disease Maternal Grandfather    Heart disease Paternal Grandmother    Rheum arthritis Paternal Grandmother    Asthma Sister    Lupus Maternal Aunt    Seizures Paternal Uncle        as a child   Rheum arthritis Maternal Aunt    Asthma Other        Maternal Great Uncle    Asthma Sister        had as a child    Social History   Socioeconomic History   Marital status: Single    Spouse name: Not on file   Number of children: 0   Years of education: 12   Highest education level:  12th grade  Occupational History   Occupation: Research officer, political party: UNEMPLOYED   Occupation: Dentist: UNEMPLOYED  Tobacco Use   Smoking status: Never    Passive exposure: Never   Smokeless tobacco: Never  Vaping Use   Vaping status: Never Used  Substance and Sexual Activity   Alcohol use: No   Drug use: No   Sexual activity: Not on file  Other Topics Concern   Not on file  Social History Narrative   Patient is single and lives at home with her parents.   Patient is working part-time.   Patient has a high school education.   Patient is right-handed.   Patient drinks about 1-2 cups of hot tea daily   Social Determinants of Health   Financial Resource Strain: Low Risk  (09/23/2022)   Overall Financial Resource Strain (CARDIA)    Difficulty of Paying Living Expenses: Not very hard  Food Insecurity: No  Food Insecurity (09/23/2022)   Hunger Vital Sign    Worried About Running Out of Food in the Last Year: Never true    Ran Out of Food in the Last Year: Never true  Transportation Needs: No Transportation Needs (09/23/2022)   PRAPARE - Administrator, Civil Service (Medical): No    Lack of Transportation (Non-Medical): No  Physical Activity: Unknown (09/23/2022)   Exercise Vital Sign    Days of Exercise per Week: Patient declined    Minutes of Exercise per Session: Not on file  Stress: No Stress Concern Present (09/23/2022)   [employer] of Occupational Health - Occupational Stress Questionnaire    Feeling of Stress : Not at all  Social Connections: Moderately Isolated (09/23/2022)   Social Connection and Isolation Panel [NHANES]    Frequency of Communication with Friends and Family: More than three times a week    Frequency of Social Gatherings with Friends and Family: More than three times a week    Attends Religious Services: More than 4 times per year    Active Member of [employer] or Organizations: No    Attends Engineer, structural: Not on file    Marital Status: Never married  Intimate Partner Violence: Unknown (08/20/2021)   Received from [facility]   HITS    Physically Hurt: Not on file    Insult or Talk Down To: Not on file    Threaten Physical Harm: Not on file    Scream or Curse: Not on file    Review of Systems  Respiratory:  Positive for cough. Negative for shortness of breath and wheezing.   All other systems reviewed and are negative.       Objective    BP 118/85   Pulse (!) 101   Temp 97.6 F (36.4 C) (Oral)   Resp 16   Wt 173 lb (78.5 kg)   SpO2 97%   BMI 28.79 kg/m   Physical Exam Vitals and nursing note reviewed.  Constitutional:      General: She is not in acute distress. Cardiovascular:     Rate and Rhythm: Normal rate and regular rhythm.  Pulmonary:     Effort: Pulmonary effort is normal.     Breath  sounds: Normal breath sounds.  Neurological:     General: No focal deficit present.     Mental Status: She is alert and oriented to person, place, and time.  Psychiatric:        Mood and Affect: Affect  normal. Mood is anxious.         Assessment & Plan:   1. Post-viral cough syndrome She has had thisbefore. Reassurance given. Tessalon perles prescribed.     Return if symptoms worsen or fail to improve.   Tommie Raymond, MD

## 2023-03-23 ENCOUNTER — Encounter: Payer: Self-pay | Admitting: Family Medicine

## 2023-04-04 ENCOUNTER — Other Ambulatory Visit: Payer: Self-pay | Admitting: *Deleted

## 2023-04-04 ENCOUNTER — Other Ambulatory Visit: Payer: Self-pay

## 2023-04-04 MED ORDER — CLONAZEPAM 0.5 MG PO TABS: ORAL_TABLET | ORAL | 1 refills | Status: DC

## 2023-04-04 NOTE — Telephone Encounter (Signed)
Pt sent mychart message requesting refill on clonazepam. Last seen 08/04/22 and next f/u 08/08/23. Last refilled 12/15/22 #45.

## 2023-04-27 NOTE — Patient Instructions (Signed)
Below is our plan:  We will continue current treatment plan. Continue to monitor weight.   Please make sure you are consistent with timing of seizure medication. I recommend annual visit with primary care provider (PCP) for complete physical and routine blood work. I recommend daily intake of vitamin D (400-800iu) and calcium (800-1000mg ) for bone health. Discuss Dexa screening with PCP.   According to [place] law, you can not drive unless you are seizure / syncope free for at least 6 months and under physician's care.  Please maintain precautions. Do not participate in activities where a loss of awareness could harm you or someone else. No swimming alone, no tub bathing, no hot tubs, no driving, no operating motorized vehicles (cars, ATVs, motocycles, etc), lawnmowers, power tools or firearms. No standing at heights, such as rooftops, ladders or stairs. Avoid hot objects such as stoves, heaters, open fires. Wear a helmet when riding a bicycle, scooter, skateboard, etc. and avoid areas of traffic. Set your water heater to 120 degrees or less.  SUDEP is the sudden, unexpected death of someone with epilepsy, who was otherwise healthy. In SUDEP cases, no other cause of death is found when an autopsy is done. Each year, more than 1 in 1,000 people with epilepsy die from SUDEP. This is the leading cause of death in people with uncontrolled seizures. Until further answers are available, the best way to prevent SUDEP is to lower your risk by controlling seizures. Research has found that people with all types of epilepsy that experience convulsive seizures can be at risk.  Please make sure you are staying well hydrated. I recommend 50-60 ounces daily. Well balanced diet and regular exercise encouraged. Consistent sleep schedule with 6-8 hours recommended.   Please continue follow up with care team as directed.   Follow up with Dr Vickey Huger in 6-12 months   You may receive a survey regarding today's visit. I  encourage you to leave honest feed back as I do use this information to improve patient care. Thank you for seeing me today!

## 2023-04-27 NOTE — Progress Notes (Signed)
PATIENT: Jennifer Luna DOB: 06-09-1990  REASON FOR VISIT: follow up HISTORY FROM: patient  Chief Complaint  Patient presents with   Follow-up    Pt in room 1 alone. Here for seizure follow up. Pt reports no recent seizure.  Pt went to GI was diagnosed with IBS. Pt would like to discuss if medication has been caused IBS.     HISTORY OF PRESENT ILLNESS:  05/01/23 ALL: Jennifer Luna returns for follow up for epilepsy. She continues lacosamide 50/100, topiramate 50/100, zonisamide 50mg  and clonazepam 0.25mg  at bedtime. She continues to do well from seizure standpoint. No seizure activity. No myoclonic activity. She seems to be tolerating meds well. She does report more GI symptoms over the past year. Typically has diarrhea with certain food groups (processed, carbs, dairy). She was seen by GI last month and was diagnosed with IBS. She is taking Ibgard that seems to help. She has lost about 30lbs following diet changes and adding exercise.   08/04/2022 ALL: Jennifer Luna returns for follow up for epilepsy. She continues lacosamide 50mg  in am and 100mg  at bedtime, topiramate 50mg  in am and 100mg  at bedtime, zonisamide 50mg  at bedtime and clonazepam 0.25 (1/2 tablet) at bedtime. Vimpat level was low at previous visit with Dr Vickey Huger 07/2021. She admitted to not being consistent with dosing. She reports doing well. She is taking meds daily as prescribed. She is tlerating it well. She thinks she is receiving Vimpat brand, not generic. She has taken lacosamide in the past and tolerated it well. Last seizure in 2013. She is working full time. She drives without difficulty. She is able to maintain her home.   09/11/2019 ALL: Jennifer Luna is a 32 y.o. female here today for follow up for epilepsy. She continues Vimpat 50mg  in am and 100mg  in pm as well as topiramate 50mg  in am and 100mg  in pm. She is taking clonazepam 0.25mg  at bedtime. She is doing well. She is driving without difficulty. She is working  from home, part time. She has had both COVID vaccines. She denies side effects with vaccine. She has follow up with GYN in June.   HISTORY: (copied from Dr Dohmeier's note on 03/12/2019)  Interval history for [facility], a patient I have followed since 2011. Jennifer Luna is currently taking Vimpat 100 mg by mouth twice a day, topiramate 100 mg twice a day and Klonopin at bedtime. She continues to be well controlled. Her last seizure occurred in 2013. 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 She will loose health insurance through her parents in October, wants her refills on 90 day bases now. CMET ordered.    11-05-2015,Jennifer Luna is a 32 year old female with a history of seizures. She continues to take Vimpat, Topamax and clonazepam. She reports that she is not had any seizures since the last visit. She is able to operate a motor vehicle without difficulty. She is able to complete all ADLs independently. She states that her seizures typically consist of staring spells that last for several seconds. She denies any changes in her mood or behavior. She does state that she's recently been very tired. She states that she works 4 hours a day and is also in [employer]. She states that lately she has been very busy. She denies any changes with her walking or balance. However her mother speaks up and states that she is very stiff for a 32 year old.  She states that when she is gets out of bed in the morning she does feel stiff in her joints specifically the hips and knees. She states that when she gets going this improves.  The mother states that she has fibromyalgia and is wondering if some of the symptoms that her daughter has maybe related to fibromyalgia as well. She returns today to obtain refills for her antiepileptic medication. She also described recently a spell where she had some myoclonic jerking in the right elbow  and right knee, she was out in public and left the event concerned that this may proceed a seizure but this did not happen. Her last recorded seizures from 2013. [name] has been driving, she is currently in summer break from school.   HISTORY 10/29/14:  Jennifer Luna is a 32 y.o. female  Is seen here as a  revisit  from Dr. Cleta Alberts. She has been one year seizure free and driving. She still has occasional little absence "blurps" but she has not had the loss of conversational flow and the secondary generalization. She had onset of seizures at age 35 . Her mother described the toddler coming up to her and sudden eye flutter and staring off, unresponsive.    Her last  seizure in 2013 , April.    Driving to work, has 2 part time jobs. She has developed well, has become much more independent and can drive.    She's taking 2 Vimpat 100 mg by mouth twice a day, she's taking Topiramate 100 mg bid po.   She's taking Klonopin or 0.5 mg a half tablet by mouth at bedtime.   She has been informed about a VNS treatment option, she doesn't want now.    REVIEW OF SYSTEMS: Out of a complete 14 system review of symptoms, the patient complains only of the following symptoms, abdominal pain, diarrhea and all other reviewed systems are negative.  ALLERGIES: Allergies  Allergen Reactions   Penicillins Rash    Has patient had a PCN reaction causing immediate rash, facial/tongue/throat swelling, SOB or lightheadedness with hypotension: No  Has patient had a PCN reaction causing severe rash involving mucus membranes or skin necrosis: Yes  Has patient had a PCN reaction that required hospitalization No  Has patient had a PCN reaction occurring within the last 10 years: No  If all of the above answers are "NO", then may proceed with Cephalosporin use.   Sulfa Antibiotics Nausea Only, Rash and Other (See Comments)   Penicillin G Nausea And Vomiting   Erythromycin Rash and Nausea And Vomiting    HOME  MEDICATIONS: Outpatient Medications Prior to Visit  Medication Sig Dispense Refill   Ascorbic Acid (VITAMIN C PO) Take 1 tablet by mouth daily.     clonazePAM (KLONOPIN) 0.5 MG tablet TAKE 1/2 (ONE-HALF) TABLET BY MOUTH AT BEDTIME 45 tablet 1   drospirenone-ethinyl estradiol (YAZ,GIANVI,LORYNA) 3-0.02 MG tablet Take 1 tablet by mouth daily.   12   Multiple Vitamin (MULTIVITAMIN ADULT PO) Take by mouth. 1 po daily     lacosamide (VIMPAT) 50 MG TABS tablet TAKE 1 TABLET BY MOUTH ONCE DAILY IN THE MORNING AND 2 AT BEDTIME 270 tablet 1   topiramate (TOPAMAX) 100 MG tablet 50 mg(0.5 tablets)  in AM and 100 mg (1 whole tablet) in PM by mouth. 270 tablet 3   zonisamide (ZONEGRAN) 25 MG capsule Take 2 capsules (50 mg total) by mouth at bedtime. 180 capsule 2   benzonatate (TESSALON) 100 MG capsule  Take 1 capsule (100 mg total) by mouth 2 (two) times daily as needed for cough. 30 capsule 1   fluticasone (FLONASE) 50 MCG/ACT nasal spray Place 2 sprays into both nostrils daily. 16 g 0   ibuprofen (ADVIL,MOTRIN) 600 MG tablet Take 1 tablet (600 mg total) by mouth every 6 (six) hours as needed. 30 tablet 0   ketoconazole (NIZORAL) 2 % shampoo Apply 1 application topically 2 (two) times a week. 120 mL 0   mometasone (ELOCON) 0.1 % cream APPLY CREAM TOPICALLY TO AFFECTED AREA ONCE DAILY AS NEEDED FOR ITCH     No facility-administered medications prior to visit.    PAST MEDICAL HISTORY: Past Medical History:  Diagnosis Date   Amenorrhea 01/11/2012   notes from office visit, PERIMENTRUAL PAINS   Arthralgia 04/23/2013   Epilepsy (HCC)    with absence seizures, abnormal EEG   IBS (irritable bowel syndrome)    Lump in neck 08/2017   Seizures (HCC)    nx of absent and grand mal   Shortness of breath 04/23/2013   Sleepiness 01/11/2012   notes from office visit   Sleepiness    DAYTIME   Thrombocytopenia (HCC) 01/11/2012   office notes from last visit, with zarontin and depakote    PAST SURGICAL  HISTORY: Past Surgical History:  Procedure Laterality Date   shoulder injury      FAMILY HISTORY: Family History  Problem Relation Age of Onset   Hypothyroidism Mother    Arthritis Mother    Hypertension Father    Diabetes Father    Hyperlipidemia Father    Breast cancer Maternal Grandmother    Heart disease Maternal Grandmother    Hypertension Maternal Grandmother    Heart disease Maternal Grandfather    Heart disease Paternal Grandmother    Rheum arthritis Paternal Grandmother    Asthma Sister    Lupus Maternal Aunt    Seizures Paternal Uncle        as a child   Rheum arthritis Maternal Aunt    Asthma Other        Maternal Great Uncle    Asthma Sister        had as a child    SOCIAL HISTORY: Social History   Socioeconomic History   Marital status: Single    Spouse name: Not on file   Number of children: 0   Years of education: 12   Highest education level: 12th grade  Occupational History   Occupation: Research officer, political party: UNEMPLOYED   Occupation: Dentist: UNEMPLOYED  Tobacco Use   Smoking status: Never    Passive exposure: Never   Smokeless tobacco: Never  Vaping Use   Vaping status: Never Used  Substance and Sexual Activity   Alcohol use: No   Drug use: No   Sexual activity: Not on file  Other Topics Concern   Not on file  Social History Narrative   Patient is single and lives at home with her parents.   Patient is working part-time.   Patient has a high school education.   Patient is right-handed.   Patient drinks about 1-2 cups of hot tea daily   Social Drivers of Health   Financial Resource Strain: Low Risk  (09/23/2022)   Overall Financial Resource Strain (CARDIA)    Difficulty of Paying Living Expenses: Not very hard  Food Insecurity: No Food Insecurity (09/23/2022)   Hunger Vital Sign    Worried About Running Out of Food in  the Last Year: Never true    Ran Out of Food in the Last Year: Never true  Transportation  Needs: No Transportation Needs (09/23/2022)   PRAPARE - Administrator, Civil Service (Medical): No    Lack of Transportation (Non-Medical): No  Physical Activity: Unknown (09/23/2022)   Exercise Vital Sign    Days of Exercise per Week: Patient declined    Minutes of Exercise per Session: Not on file  Stress: Not on file (03/23/2023)  Social Connections: Moderately Isolated (09/23/2022)   Social Connection and Isolation Panel [NHANES]    Frequency of Communication with Friends and Family: More than three times a week    Frequency of Social Gatherings with Friends and Family: More than three times a week    Attends Religious Services: More than 4 times per year    Active Member of [employer] or Organizations: No    Attends Engineer, structural: Not on file    Marital Status: Never married  Intimate Partner Violence: Unknown (08/20/2021)   Received from [facility], [facility]   HITS    Physically Hurt: Not on file    Insult or Talk Down To: Not on file    Threaten Physical Harm: Not on file    Scream or Curse: Not on file      PHYSICAL EXAM  Vitals:   05/01/23 1048  BP: 110/75  Pulse: 83  Weight: 158 lb (71.7 kg)  Height: 5\' 5"  (1.651 m)     Body mass index is 26.29 kg/m.  Generalized: Well developed, in no acute distress  Cardiology: normal rate and rhythm, no murmur noted Respiratory: clear to auscultation bilaterally  Neurological examination  Mentation: Alert oriented to time, place, history taking. Follows all commands speech and language fluent Cranial nerve II-XII: Pupils were equal round reactive to light. Extraocular movements were full Motor: The motor testing reveals 5 over 5 strength of all 4 extremities. Good symmetric motor tone is noted throughout.  Sensory: Sensory testing is intact to soft touch on all 4 extremities. No evidence of extinction is noted.  Gait and station: Gait is normal.   DIAGNOSTIC DATA (LABS, IMAGING, TESTING) - I  reviewed patient records, labs, notes, testing and imaging myself where available.      No data to display           Lab Results  Component Value Date   WBC 3.4 05/13/2022   HGB 13.5 05/13/2022   HCT 40.1 05/13/2022   MCV 91 05/13/2022   PLT 245 05/13/2022      Component Value Date/Time   NA 141 05/13/2022 1157   K 4.2 05/13/2022 1157   CL 103 05/13/2022 1157   CO2 23 05/13/2022 1157   GLUCOSE 76 05/13/2022 1157   GLUCOSE 102 (H) 12/14/2017 1614   BUN 16 05/13/2022 1157   CREATININE 0.71 05/13/2022 1157   CREATININE 0.74 04/24/2013 1605   CALCIUM 9.4 05/13/2022 1157   PROT 7.0 05/13/2022 1157   ALBUMIN 4.7 05/13/2022 1157   AST 20 05/13/2022 1157   ALT 19 05/13/2022 1157   ALKPHOS 59 05/13/2022 1157   BILITOT 0.2 05/13/2022 1157   GFRNONAA 101 05/12/2020 1118   GFRAA 117 05/12/2020 1118   Lab Results  Component Value Date   CHOL 163 05/13/2022   HDL 63 05/13/2022   LDLCALC 88 05/13/2022   TRIG 59 05/13/2022   CHOLHDL 2.6 05/13/2022   Lab Results  Component Value Date   HGBA1C 5.6  05/13/2022   Lab Results  Component Value Date   VITAMINB12 911 04/24/2013   Lab Results  Component Value Date   TSH 0.570 05/13/2022     ASSESSMENT AND PLAN 33 y.o. year old female  has a past medical history of Amenorrhea (01/11/2012), Arthralgia (04/23/2013), Epilepsy (HCC), IBS (irritable bowel syndrome), Lump in neck (08/2017), Seizures (HCC), Shortness of breath (04/23/2013), Sleepiness (01/11/2012), Sleepiness, and Thrombocytopenia (HCC) (01/11/2012). here with     ICD-10-CM   1. Seizures (HCC)  R56.9 Lacosamide    CBC with Differential/Platelets    CMP    zonisamide (ZONEGRAN) 25 MG capsule    topiramate (TOPAMAX) 100 MG tablet    Topiramate Level    Zonisamide level       Moneka is doing well, today. Per pharmacy, patient has been receiving generic lacosamide for the past year. She continues lacosamide, topiramate, zonisamide and clonazepam as prescribed and  is tolerating well. PMP aware and refills are appropriate. She continues to be independent and driving without difficult. We will update labs. She will stay well hydrated. Focus on healthy lifestyle habits. She will monitor weight. We have discussed possible side effects with seizure agents, however, she has tolerated this regimen since 2014 and is seizure free at this time. She will follow up with PCP and GYN as advised. Follow up with Dr Vickey Huger per request in 6-12 months. She verbalizes understanding and agreement with this plan.    Orders Placed This Encounter  Procedures   Lacosamide   CBC with Differential/Platelets   CMP   Topiramate Level   Zonisamide level     Meds ordered this encounter  Medications   zonisamide (ZONEGRAN) 25 MG capsule    Sig: Take 2 capsules (50 mg total) by mouth at bedtime.    Dispense:  180 capsule    Refill:  1    Supervising Provider:   Anson Fret [[id number]]   topiramate (TOPAMAX) 100 MG tablet    Sig: 50 mg(0.5 tablets)  in AM and 100 mg (1 whole tablet) in PM by mouth.    Dispense:  270 tablet    Refill:  1    Supervising Provider:   Anson Fret [[id number]]   lacosamide (VIMPAT) 50 MG TABS tablet    Sig: TAKE 1 TABLET BY MOUTH ONCE DAILY IN THE MORNING AND 2 AT BEDTIME    Dispense:  270 tablet    Refill:  1    Supervising Provider:   Anson Fret [[id number]]      WUX LKGMW, FNP-C 05/01/2023, 11:32 AM [facility] [address] [place], [place] [address] [phone]

## 2023-05-01 ENCOUNTER — Ambulatory Visit (INDEPENDENT_AMBULATORY_CARE_PROVIDER_SITE_OTHER): Payer: BC Managed Care – PPO | Admitting: Family Medicine

## 2023-05-01 ENCOUNTER — Encounter: Payer: Self-pay | Admitting: Family Medicine

## 2023-05-01 VITALS — BP 110/75 | HR 83 | Ht 65.0 in | Wt 158.0 lb

## 2023-05-01 DIAGNOSIS — R569 Unspecified convulsions: Secondary | ICD-10-CM

## 2023-05-01 MED ORDER — TOPIRAMATE 100 MG PO TABS: ORAL_TABLET | ORAL | 1 refills | Status: DC

## 2023-05-01 MED ORDER — ZONISAMIDE 25 MG PO CAPS: 50.0000 mg | ORAL_CAPSULE | Freq: Every day | ORAL | 1 refills | Status: DC

## 2023-05-01 MED ORDER — LACOSAMIDE 50 MG PO TABS: ORAL_TABLET | ORAL | 1 refills | Status: DC

## 2023-05-04 LAB — CBC WITH DIFFERENTIAL/PLATELET
Basophils Absolute: 0 10*3/uL (ref 0.0–0.2)
Basos: 1 %
EOS (ABSOLUTE): 0.1 10*3/uL (ref 0.0–0.4)
Eos: 2 %
Hematocrit: 42.8 % (ref 34.0–46.6)
Hemoglobin: 13.7 g/dL (ref 11.1–15.9)
Immature Grans (Abs): 0 10*3/uL (ref 0.0–0.1)
Immature Granulocytes: 0 %
Lymphocytes Absolute: 1.5 10*3/uL (ref 0.7–3.1)
Lymphs: 47 %
MCH: 29.1 pg (ref 26.6–33.0)
MCHC: 32 g/dL (ref 31.5–35.7)
MCV: 91 fL (ref 79–97)
Monocytes Absolute: 0.3 10*3/uL (ref 0.1–0.9)
Monocytes: 9 %
Neutrophils Absolute: 1.3 10*3/uL — ABNORMAL LOW (ref 1.4–7.0)
Neutrophils: 41 %
Platelets: 222 10*3/uL (ref 150–450)
RBC: 4.7 x10E6/uL (ref 3.77–5.28)
RDW: 12 % (ref 11.7–15.4)
WBC: 3.2 10*3/uL — ABNORMAL LOW (ref 3.4–10.8)

## 2023-05-04 LAB — COMPREHENSIVE METABOLIC PANEL
ALT: 26 [IU]/L (ref 0–32)
AST: 21 [IU]/L (ref 0–40)
Albumin: 4.9 g/dL (ref 3.9–4.9)
Alkaline Phosphatase: 57 [IU]/L (ref 44–121)
BUN/Creatinine Ratio: 17 (ref 9–23)
BUN: 14 mg/dL (ref 6–20)
Bilirubin Total: 0.2 mg/dL (ref 0.0–1.2)
CO2: 17 mmol/L — ABNORMAL LOW (ref 20–29)
Calcium: 9.3 mg/dL (ref 8.7–10.2)
Chloride: 108 mmol/L — ABNORMAL HIGH (ref 96–106)
Creatinine, Ser: 0.82 mg/dL (ref 0.57–1.00)
Globulin, Total: 2.6 g/dL (ref 1.5–4.5)
Glucose: 91 mg/dL (ref 70–99)
Potassium: 4 mmol/L (ref 3.5–5.2)
Sodium: 141 mmol/L (ref 134–144)
Total Protein: 7.5 g/dL (ref 6.0–8.5)
eGFR: 97 mL/min/{1.73_m2} (ref >59)

## 2023-05-04 LAB — LACOSAMIDE: Lacosamide: 5.3 ug/mL (ref 5.0–10.0)

## 2023-05-04 LAB — TOPIRAMATE LEVEL: Topiramate Lvl: 6.2 ug/mL (ref 2.0–25.0)

## 2023-05-04 LAB — ZONISAMIDE LEVEL: Zonisamide: <2 ug/mL — ABNORMAL LOW (ref 10.0–40.0)

## 2023-05-15 ENCOUNTER — Encounter: Payer: Self-pay | Admitting: Family Medicine

## 2023-05-18 ENCOUNTER — Encounter: Payer: BC Managed Care – PPO | Admitting: Family

## 2023-06-08 ENCOUNTER — Ambulatory Visit (INDEPENDENT_AMBULATORY_CARE_PROVIDER_SITE_OTHER): Payer: 59 | Admitting: Family Medicine

## 2023-06-08 ENCOUNTER — Encounter: Payer: Self-pay | Admitting: Family Medicine

## 2023-06-08 VITALS — BP 119/81 | HR 78 | Temp 98.1°F | Resp 16 | Ht 65.0 in | Wt 154.2 lb

## 2023-06-08 DIAGNOSIS — Z0000 Encounter for general adult medical examination without abnormal findings: Secondary | ICD-10-CM | POA: Diagnosis not present

## 2023-06-08 DIAGNOSIS — Z13220 Encounter for screening for lipoid disorders: Secondary | ICD-10-CM

## 2023-06-08 DIAGNOSIS — Z130 Encounter for screening for diseases of the blood and blood-forming organs and certain disorders involving the immune mechanism: Secondary | ICD-10-CM

## 2023-06-09 ENCOUNTER — Encounter: Payer: Self-pay | Admitting: Family Medicine

## 2023-06-09 NOTE — Progress Notes (Addendum)
Established Patient Office Visit  Subjective    Patient ID: Jennifer Luna, female    DOB: 08/21/1990  Age: 33 y.o. MRN: [id number]  CC:  Chief Complaint  Patient presents with   Annual Exam    HPI Jennifer Luna presents for routine annual exam. Patient denies acute complaints.   Outpatient Encounter Medications as of 06/08/2023  Medication Sig   Ascorbic Acid (VITAMIN C PO) Take 1 tablet by mouth daily.   benzonatate (TESSALON) 100 MG capsule Take 1 capsule (100 mg total) by mouth 2 (two) times daily as needed for cough.   clonazePAM (KLONOPIN) 0.5 MG tablet TAKE 1/2 (ONE-HALF) TABLET BY MOUTH AT BEDTIME   drospirenone-ethinyl estradiol (YAZ,GIANVI,LORYNA) 3-0.02 MG tablet Take 1 tablet by mouth daily.    fluticasone (FLONASE) 50 MCG/ACT nasal spray Place 2 sprays into both nostrils daily.   ibuprofen (ADVIL,MOTRIN) 600 MG tablet Take 1 tablet (600 mg total) by mouth every 6 (six) hours as needed.   ketoconazole (NIZORAL) 2 % shampoo Apply 1 application topically 2 (two) times a week.   lacosamide (VIMPAT) 50 MG TABS tablet TAKE 1 TABLET BY MOUTH ONCE DAILY IN THE MORNING AND 2 AT BEDTIME   mometasone (ELOCON) 0.1 % cream APPLY CREAM TOPICALLY TO AFFECTED AREA ONCE DAILY AS NEEDED FOR ITCH   Multiple Vitamin (MULTIVITAMIN ADULT PO) Take by mouth. 1 po daily   topiramate (TOPAMAX) 100 MG tablet 50 mg(0.5 tablets)  in AM and 100 mg (1 whole tablet) in PM by mouth.   zonisamide (ZONEGRAN) 25 MG capsule Take 2 capsules (50 mg total) by mouth at bedtime.   No facility-administered encounter medications on file as of 06/08/2023.    Past Medical History:  Diagnosis Date   Amenorrhea 01/11/2012   notes from office visit, PERIMENTRUAL PAINS   Arthralgia 04/23/2013   Epilepsy (HCC)    with absence seizures, abnormal EEG   IBS (irritable bowel syndrome)    Lump in neck 08/2017   Seizures (HCC)    nx of absent and grand mal   Shortness of breath 04/23/2013   Sleepiness  01/11/2012   notes from office visit   Sleepiness    DAYTIME   Thrombocytopenia (HCC) 01/11/2012   office notes from last visit, with zarontin and depakote    Past Surgical History:  Procedure Laterality Date   shoulder injury      Family History  Problem Relation Age of Onset   Hypothyroidism Mother    Arthritis Mother    Hypertension Father    Diabetes Father    Hyperlipidemia Father    Breast cancer Maternal Grandmother    Heart disease Maternal Grandmother    Hypertension Maternal Grandmother    Heart disease Maternal Grandfather    Heart disease Paternal Grandmother    Rheum arthritis Paternal Grandmother    Asthma Sister    Lupus Maternal Aunt    Seizures Paternal Uncle        as a child   Rheum arthritis Maternal Aunt    Asthma Other        Maternal Great Uncle    Asthma Sister        had as a child    Social History   Socioeconomic History   Marital status: Single    Spouse name: Not on file   Number of children: 0   Years of education: 12   Highest education level: 12th grade  Occupational History   Occupation: STUDENT/Cashier  Employer: UNEMPLOYED   Occupation: Dentist: UNEMPLOYED  Tobacco Use   Smoking status: Never    Passive exposure: Never   Smokeless tobacco: Never  Vaping Use   Vaping status: Never Used  Substance and Sexual Activity   Alcohol use: No   Drug use: No   Sexual activity: Not on file  Other Topics Concern   Not on file  Social History Narrative   Patient is single and lives at home with her parents.   Patient is working part-time.   Patient has a high school education.   Patient is right-handed.   Patient drinks about 1-2 cups of hot tea daily   Social Drivers of Health   Financial Resource Strain: Low Risk  (09/23/2022)   Overall Financial Resource Strain (CARDIA)    Difficulty of Paying Living Expenses: Not very hard  Food Insecurity: No Food Insecurity (09/23/2022)   Hunger Vital Sign    Worried  About Running Out of Food in the Last Year: Never true    Ran Out of Food in the Last Year: Never true  Transportation Needs: No Transportation Needs (09/23/2022)   PRAPARE - Administrator, Civil Service (Medical): No    Lack of Transportation (Non-Medical): No  Physical Activity: Unknown (09/23/2022)   Exercise Vital Sign    Days of Exercise per Week: Patient declined    Minutes of Exercise per Session: Not on file  Stress: Not on file (03/23/2023)  Social Connections: Moderately Isolated (09/23/2022)   Social Connection and Isolation Panel [NHANES]    Frequency of Communication with Friends and Family: More than three times a week    Frequency of Social Gatherings with Friends and Family: More than three times a week    Attends Religious Services: More than 4 times per year    Active Member of [employer] or Organizations: No    Attends Engineer, structural: Not on file    Marital Status: Never married  Intimate Partner Violence: Unknown (08/20/2021)   Received from [facility], [facility]   HITS    Physically Hurt: Not on file    Insult or Talk Down To: Not on file    Threaten Physical Harm: Not on file    Scream or Curse: Not on file    Review of Systems  All other systems reviewed and are negative.       Objective    BP 119/81 (BP Location: Right Arm, Patient Position: Sitting, Cuff Size: Normal)   Pulse 78   Temp 98.1 F (36.7 C) (Oral)   Resp 16   Ht 5\' 5"  (1.651 m)   Wt 154 lb 3.2 oz (69.9 kg)   SpO2 99%   BMI 25.66 kg/m   Physical Exam Vitals and nursing note reviewed.  Constitutional:      General: She is not in acute distress. HENT:     Head: Normocephalic and atraumatic.     Right Ear: Tympanic membrane, ear canal and external ear normal.     Left Ear: Tympanic membrane, ear canal and external ear normal.     Nose: Nose normal.     Mouth/Throat:     Mouth: Mucous membranes are moist.     Pharynx: Oropharynx is clear.  Eyes:      Conjunctiva/sclera: Conjunctivae normal.     Pupils: Pupils are equal, round, and reactive to light.  Neck:     Thyroid: No thyromegaly.  Cardiovascular:  Rate and Rhythm: Normal rate and regular rhythm.     Heart sounds: Normal heart sounds. No murmur heard. Pulmonary:     Effort: Pulmonary effort is normal. No respiratory distress.     Breath sounds: Normal breath sounds.  Abdominal:     General: There is no distension.     Palpations: Abdomen is soft. There is no mass.     Tenderness: There is no abdominal tenderness.  Musculoskeletal:        General: Normal range of motion.     Cervical back: Normal range of motion and neck supple.  Skin:    General: Skin is warm and dry.  Neurological:     General: No focal deficit present.     Mental Status: She is alert and oriented to person, place, and time.  Psychiatric:        Mood and Affect: Mood normal.        Behavior: Behavior normal.         Assessment & Plan:   Annual physical exam   Labs were recently performed by GYN and were reviewed  No follow-ups on file.   Tommie Raymond, MD

## 2023-06-12 ENCOUNTER — Telehealth: Payer: Self-pay | Admitting: Internal Medicine

## 2023-06-12 NOTE — Telephone Encounter (Signed)
Request received to transfer GI care from outside practice to [facility].  She had consult with GI just 2 months ago.  We appreciate the interest in our practice, however at this time due to high demand from patients without established GI providers we cannot accommodate this transfer.  Ability to accommodate future transfer requests may change over time and the patient can contact us again in 6-12 months if still interested in being seen at [facility].

## 2023-06-12 NOTE — Telephone Encounter (Signed)
Good morning Dr. Rhea Belton,    We received a referral for patient for unintentional weight loss. Patient last saw [facility] in November 2024. Stated she was advised that she has IBS and should look into getting a colonoscopy. Patient states she does not agree with this recommendation and wishes for a second opinion. Requesting to be seen specifically with you due to her providers recommendations. Patient's previous records are in [facility] for you to review and advise on scheduling.    Thank you.

## 2023-06-28 NOTE — Telephone Encounter (Signed)
Patient advised.

## 2023-08-08 ENCOUNTER — Ambulatory Visit: Payer: BC Managed Care – PPO | Admitting: Family Medicine

## 2023-08-10 ENCOUNTER — Telehealth: Payer: Self-pay | Admitting: *Deleted

## 2023-08-10 NOTE — Telephone Encounter (Signed)
 Patient request GI referral

## 2023-08-11 ENCOUNTER — Other Ambulatory Visit: Payer: Self-pay | Admitting: Family

## 2023-08-11 DIAGNOSIS — R109 Unspecified abdominal pain: Secondary | ICD-10-CM

## 2023-08-11 NOTE — Telephone Encounter (Signed)
 Complete

## 2023-08-28 ENCOUNTER — Telehealth: Payer: Self-pay

## 2023-08-28 NOTE — Telephone Encounter (Signed)
 Patient was referred to [facility] on 08/11/2023. Please see patient request from pervious note.

## 2023-08-28 NOTE — Telephone Encounter (Signed)
 Copied from CRM [id number]. Topic: Referral - Status >> Aug 28, 2023  2:55 PM Ivette P wrote: Reason for CRM: pt called in to follow up on status, referral was denied due to referral having too high of a demand. Pt would like to be referred to another gastro.    [phone]

## 2023-08-31 NOTE — Telephone Encounter (Signed)
 Noted.

## 2023-10-11 ENCOUNTER — Telehealth: Payer: Self-pay

## 2023-10-11 MED ORDER — CLONAZEPAM 0.5 MG PO TABS: ORAL_TABLET | ORAL | 0 refills | Status: DC

## 2023-10-11 NOTE — Telephone Encounter (Signed)
 Pt last seen 05/01/2023 Terrilyn Fick, NP) Upcoming Appointment 10/23/2023 (Dr. Albertina Hugger)  Clonazepam  0.5mg  last filled 07/02/2023 (90 day Supply) Escript 10/11/2023

## 2023-10-23 ENCOUNTER — Encounter: Payer: Self-pay | Admitting: Neurology

## 2023-10-23 ENCOUNTER — Ambulatory Visit: Admitting: Neurology

## 2023-10-23 VITALS — BP 122/76 | HR 85 | Ht 65.0 in | Wt 149.0 lb

## 2023-10-23 DIAGNOSIS — G40309 Generalized idiopathic epilepsy and epileptic syndromes, not intractable, without status epilepticus: Secondary | ICD-10-CM

## 2023-10-23 DIAGNOSIS — G40A09 Absence epileptic syndrome, not intractable, without status epilepticus: Secondary | ICD-10-CM

## 2023-10-23 DIAGNOSIS — R569 Unspecified convulsions: Secondary | ICD-10-CM | POA: Insufficient documentation

## 2023-10-23 MED ORDER — ZONISAMIDE 25 MG PO CAPS: 50.0000 mg | ORAL_CAPSULE | Freq: Every day | ORAL | 1 refills | Status: DC

## 2023-10-23 MED ORDER — LACOSAMIDE 50 MG PO TABS: ORAL_TABLET | ORAL | 1 refills | Status: DC

## 2023-10-23 MED ORDER — TOPIRAMATE 100 MG PO TABS: ORAL_TABLET | ORAL | 1 refills | Status: AC

## 2023-10-23 NOTE — Progress Notes (Signed)
 Provider:  Neomia Banner, MD  Primary Care Physician:  Senaida Dama, NP [address] [place] [address]     Referring Provider: Senaida Dama, Np [address] [place],  [place] [address]          Chief Complaint according to patient   Patient presents with:                HISTORY OF PRESENT ILLNESS:  Jennifer Luna is a 33 y.o. female patient who is here for revisit 10/23/2023 for seizure disorder.    Chief concern according to patient :  " I have not experienced any seizures, absences, no unexplained bruising or soreness in th mornings" .  She had a major seizure once in her life and injured / dislocated her right shoulder. She still feels an occasionally catch or ache, not  pain, non radiating : No concern about her seizures.  She has been on seizure medication since age 72.     She is on three seizure medications, she is taking YAZ birth control and needs to know if she has safety concerns about the combination with seizure medication.   We discussed that pregnancy fro women with epilepsy has other challenges. I recommended to start a prenatal vitamin in any case to  be prepared.    Epilepsy (HCC)- absence , generalized/  Shortness of breath (04/23/2013), Sleepiness (01/11/2012),  and Thrombocytopenia (HCC) (01/11/12).    Here with: last seizure recorded 2013, she is now working and is driving- She had in 8295 myoclonic jerks preceding a seizure, now reports still occassional myoclonic jerks.   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 Her diagnosis is EPILEPSY, not just "seizures".   She takes birth control pills to control menstrual cramping, not for birth control. She knows that the birth control effect can be reduced by taking antiepileptic medication. She has now regular menstrual periods.       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      Review of Systems: Out of a complete 14 system review, the patient complains of only the following symptoms, and all other reviewed systems are negative.:   Social History   Socioeconomic History   Marital status: Single    Spouse name: Not on file   Number of children: 0   Years of education: 12   Highest education level: 12th grade  Occupational History   Occupation: Research officer, political party: UNEMPLOYED   Occupation: Dentist: UNEMPLOYED  Tobacco Use   Smoking status: Never    Passive exposure: Never   Smokeless tobacco: Never  Vaping Use   Vaping status: Never Used  Substance and Sexual Activity   Alcohol use: No   Drug use: No   Sexual activity: Not on file  Other Topics Concern   Not on file  Social History Narrative   Patient is single and lives at home with her parents.   Patient is working part-time.   Patient has a high school education.   Patient is right-handed.   Patient drinks about 1-2 cups of hot tea daily   Social Drivers of Health   Financial Resource Strain: Low Risk  (09/23/2022)   Overall Financial Resource Strain (CARDIA)    Difficulty of Paying Living Expenses: Not very  hard  Food Insecurity: No Food Insecurity (09/23/2022)   Hunger Vital Sign    Worried About Running Out of Food in the Last Year: Never true    Ran Out of Food in the Last Year: Never true  Transportation Needs: No Transportation Needs (09/23/2022)   PRAPARE - Administrator, Civil Service (Medical): No    Lack of Transportation (Non-Medical): No  Physical Activity: Unknown (09/23/2022)   Exercise Vital Sign    Days of Exercise per Week: Patient declined    Minutes of Exercise per Session: Not on file  Stress: Not on file (03/23/2023)  Social Connections: Moderately Isolated (09/23/2022)   Social Connection and Isolation Panel [NHANES]    Frequency of Communication with Friends and Family: More than three times a  week    Frequency of Social Gatherings with Friends and Family: More than three times a week    Attends Religious Services: More than 4 times per year    Active Member of [employer] or Organizations: No    Attends Engineer, structural: Not on file    Marital Status: Never married    Family History  Problem Relation Age of Onset   Hypothyroidism Mother    Arthritis Mother    Hypertension Father    Diabetes Father    Hyperlipidemia Father    Breast cancer Maternal Grandmother    Heart disease Maternal Grandmother    Hypertension Maternal Grandmother    Heart disease Maternal Grandfather    Heart disease Paternal Grandmother    Rheum arthritis Paternal Grandmother    Asthma Sister    Lupus Maternal Aunt    Seizures Paternal Uncle        as a child   Rheum arthritis Maternal Aunt    Asthma Other        Maternal Great Uncle    Asthma Sister        had as a child    Past Medical History:  Diagnosis Date   Amenorrhea 01/11/2012   notes from office visit, PER[employer]EN[employer] PAINS   Arthralgia 04/23/2013   Epilepsy (HCC)    with absence seizures, abnormal EEG   IBS (irritable bowel syndrome)    Lump in neck 08/2017   Seizures (HCC)    nx of absent and grand mal   Shortness of breath 04/23/2013   Sleepiness 01/11/2012   notes from office visit   Sleepiness    DAYTIME   Thrombocytopenia (HCC) 01/11/2012   office notes from last visit, with zarontin and depakote    Past Surgical History:  Procedure Laterality Date   shoulder injury       Current Outpatient Medications on File Prior to Visit  Medication Sig Dispense Refill   Ascorbic Acid (VITAMIN C PO) Take 1 tablet by mouth daily.     clonazePAM  (KLONOPIN ) 0.5 MG tablet TAKE 1/2 (ONE-HALF) TABLET BY MOUTH AT BEDTIME 45 tablet 0   drospirenone-ethinyl estradiol (YAZ,GIANVI,LORYNA) 3-0.02 MG tablet Take 1 tablet by mouth daily.   12   ibuprofen  (ADVIL ,MOTRIN ) 600 MG tablet Take 1 tablet (600 mg total) by mouth every 6  (six) hours as needed. 30 tablet 0   lacosamide  (VIMPAT ) 50 MG TABS tablet TAKE 1 TABLET BY MOUTH ONCE DAILY IN THE MORNING AND 2 AT BEDTIME 270 tablet 1   Multiple Vitamin (MULTIVITAMIN ADULT PO) Take by mouth. 1 po daily     topiramate  (TOPAMAX ) 100 MG tablet 50 mg(0.5 tablets)  in AM and 100 mg (  1 whole tablet) in PM by mouth. 270 tablet 1   zonisamide  (ZONEGRAN ) 25 MG capsule Take 2 capsules (50 mg total) by mouth at bedtime. 180 capsule 1   No current facility-administered medications on file prior to visit.    Allergies  Allergen Reactions   Penicillins Rash    Has patient had a PCN reaction causing immediate rash, facial/tongue/throat swelling, SOB or lightheadedness with hypotension: No  Has patient had a PCN reaction causing severe rash involving mucus membranes or skin necrosis: Yes  Has patient had a PCN reaction that required hospitalization No  Has patient had a PCN reaction occurring within the last 10 years: No  If all of the above answers are "NO", then may proceed with Cephalosporin use.   Sulfa Antibiotics Nausea Only, Rash and Other (See Comments)   Penicillin G Nausea And Vomiting   Erythromycin Rash and Nausea And Vomiting     DIAGNOSTIC DATA (LABS, IMAGING, TESTING) - I reviewed patient records, labs, notes, testing and imaging myself where available.  Lab Results  Component Value Date   WBC 3.2 (L) 05/01/2023   HGB 13.7 05/01/2023   HCT 42.8 05/01/2023   MCV 91 05/01/2023   PLT 222 05/01/2023      Component Value Date/Time   NA 141 05/01/2023 1134   K 4.0 05/01/2023 1134   CL 108 (H) 05/01/2023 1134   CO2 17 (L) 05/01/2023 1134   GLUCOSE 91 05/01/2023 1134   GLUCOSE 102 (H) 12/14/2017 1614   BUN 14 05/01/2023 1134   CREATININE 0.82 05/01/2023 1134   CREATININE 0.74 04/24/2013 1605   CALCIUM 9.3 05/01/2023 1134   PROT 7.5 05/01/2023 1134   ALBUMIN 4.9 05/01/2023 1134   AST 21 05/01/2023 1134   ALT 26 05/01/2023 1134   ALKPHOS 57 05/01/2023  1134   BILITOT 0.2 05/01/2023 1134   GFRNONAA 101 05/12/2020 1118   GFRAA 117 05/12/2020 1118   Lab Results  Component Value Date   CHOL 163 05/13/2022   HDL 63 05/13/2022   LDLCALC 88 05/13/2022   TRIG 59 05/13/2022   CHOLHDL 2.6 05/13/2022   Lab Results  Component Value Date   HGBA1C 5.6 05/13/2022   Lab Results  Component Value Date   VITAMINB12 911 04/24/2013   Lab Results  Component Value Date   TSH 0.570 05/13/2022    PHYSICAL EXAM:  Today's Vitals   10/23/23 1040  BP: 122/76  Pulse: 85  Weight: 149 lb (67.6 kg)  Height: 5\' 5"  (1.651 m)   Body mass index is 24.79 kg/m.   Wt Readings from Last 3 Encounters:  10/23/23 149 lb (67.6 kg)  06/08/23 154 lb 3.2 oz (69.9 kg)  05/01/23 158 lb (71.7 kg)     Ht Readings from Last 3 Encounters:  10/23/23 5\' 5"  (1.651 m)  06/08/23 5\' 5"  (1.651 m)  05/01/23 5\' 5"  (1.651 m)      General: The patient is awake, alert and appears not in acute distress. The patient is well groomed. Head: Normocephalic, atraumatic. Neck is supple.08-09-2021: I am seeing Jennifer Luna on 08/09/21 at  8:30 AM EDT , for a scheduled revisit. Here also to review her HST. 04-2020- insignificant AHI, some snoring, waking up at 4 AM every day. She drives, works at a [employer]. Mainly here to get refills.        03-12-2020: RVShe has not had breakthrough seizures , she just needs refills.  Her mother has not noted any breakthrough  activity. No staring attacks, no tremors.  Patient is fully covid vaccinated , gets her booster next week. She has been waking up each night between 2 and 4 AM, and may take 60 minutes to go back to sleep- she advanced her bedtime a little- bedtime now at 9 PM.  Some nights the arousal is due to nocturia, some nights its not. She changed form prone sleep to supine - wakes up on her back with a dry mouth.  No headaches.  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   HISTORY OF PRESENT ILLNESS:   Interval history for Jennifer Luna, a patient I have followed since 2011. Jennifer Luna is currently taking Vimpat  100 mg by mouth twice a day, topiramate  100 mg twice a day and Klonopin  at bedtime. She continues to be well controlled. Her last seizure occurred in 2013. 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 She will loose health insurance through her parents in October, wants her refills on 90 day bases now. CMET ordered.    11-05-2015,Jennifer Luna is a 33 year old female with a history of seizures. She continues to take Vimpat , Topamax  and clonazepam . She reports that she is not had any seizures since the last visit. She is able to operate a motor vehicle without difficulty. She is able to complete all ADLs independently. She states that her seizures typically consist of staring spells that last for several seconds. She denies any changes in her mood or behavior. She does state that she's recently been very tired. She states that she works 4 hours a day and is also in [employer]. She states that lately she has been very busy. She denies any changes with her walking or balance. However her mother speaks up and states that she is very stiff for a 33 year old.  She states that when she is gets out of bed in the morning she does feel stiff in her joints specifically the hips and knees. She states that when she gets going this improves.  The mother states that she has fibromyalgia and is wondering if some of the symptoms that her daughter has maybe related to fibromyalgia as well. She returns today to obtain refills for her antiepileptic medication. She also described recently a spell where she had some myoclonic jerking in the right elbow and right knee, she was out in public and left the event concerned that this may proceed a seizure but this did not  happen. Her last recorded seizures from 2013. Mallorie has been driving, she is currently in summer break from school.   HISTORY 10/29/14:  Jennifer Luna is a 33 y.o. female  Is seen here as a  revisit  from Dr. Rosalio Comas. She has been one year seizure free and driving. She still has occasional little absence "blurps" but she has not had the loss of conversational flow and the secondary generalization. She had onset of seizures at age 36 . Her mother described the toddler coming up to her and sudden eye flutter and staring off, unresponsive.    Her last  seizure in 2013 , April.    Driving to work, has 2 part time jobs. She has developed well, has become much more independent and can drive.    She's taking 2 Vimpat  100 mg by mouth twice a day, she's taking  Topiramate  100 mg bid po.   She's taking Klonopin  or 0.5 mg a half tablet by mouth at bedtime.   She has been informed about a VNS treatment option, she doesn't want now.       Generalized: Well developed 33 year old Afro-American right handed female, well groomed and cooperative, pleasant- in no acute distress , hee to discuss her future in marriage and potential motherhood with epilepsy and on three medications, well controlled.  .    Neurological examination  Mentation: Alert oriented to time, place, history taking. Follows all commands speech and language fluent Cranial nerve :  No change in taste or smell- fully vaccinated.  Pupils were equal round reactive to light. Extraocular movements were full,.   Facial strength  normal. No sensory loss.  Uvula and tongue midline, no tongue bites.  Head turning and shoulder shrug  were normal and symmetric.   Motor tone and bulk and strength is symmetrical. No tremor, no ataxia.  Last GTC seizure cause injury to the right shoulder. No ROM restriction but discomfort with rotation.     Her gait intact, sensory intact. DTR symmetric 1 plus.         ASSESSMENT AND PLAN 33 y.o. year old  female  here with:    1) absence seizures, rare generalized.  13 years without seizure ! There have been no recent seizures, none for years actually , the VIMPAT  level is 5.3 now -   2) refilled all three medications.   3)  advised to start barrier contraception.  Advised to start a prenatal vitamin in any case-   50% of pregnancies are not planned.   Jennifer Flores, MD PLAN :   Patient will continue on Vimpat , Topamax  and zonisamide  for now.    Repeated CNC diff because of low WBC .   Attachment is about epilepsy and pregnancy.  Would need to wean off ZONISAMIDE  when planning pregnancy .    I would like to thank  and Senaida Dama, Np [address] [place],  [place] [address] for allowing me to meet with and to take care of this pleasant patient.    After spending a total time of  40  minutes face to face and additional time for physical and neurologic examination, review of laboratory studies,  personal review of imaging studies, reports and results of other testing and review of referral information / records as far as provided in visit,   Electronically signed by: Jennifer Banner, MD 10/23/2023 11:16 AM  [facility] Board certified by The [employer] of Sleep Medicine and Diplomate of the [employer] of Sleep Medicine. Board certified In Neurology through the ABPN, Fellow of the [employer] of Neurology.

## 2023-10-23 NOTE — Patient Instructions (Signed)
 Pregnancy and Epilepsy: What to Know Epilepsy is when a person keeps having seizures over time. A seizure is a burst of activity in the brain. Most people with epilepsy can have normal pregnancies and healthy babies. How does epilepsy affect me? During pregnancy, you may have more seizures because of stress and changes in hormones. Pregnancy can affect the way medicines work in your body. Your health care provider may need to change the dose of your medicine. Do not stop taking your medicine without talking with your provider. If you stop taking your medicines, you're at risk for certain problems, including: More seizures. Injuries from seizures. Higher risk of death. Having your baby early. How does epilepsy affect my baby? Epilepsy can affect the organ that delivers oxygen and nutrients to your baby (placenta). When this happens, the baby may: Be born early. Have a low birth weight. Have a slow heart rate. Also, medicines to treat epilepsy can affect your baby. Medicines can: Make the baby's growth slower than normal. Cause problems with blood clotting. Cause congenital conditions. These are problems a baby is born with. They include: Spine problems. The baby's lip or mouth not forming right. This is called cleft lip or cleft palate. Heart problems. How is this treated? Treatment for epilepsy may include taking medicine which helps prevent seizures. This is called an antiepileptic. Take your medicines as told by your provider. Medicines are less likely to harm your baby, but a seizure can harm the baby. Your provider will give you the safest kind of medicine at the lowest dose that will still prevent seizures. You will have blood tests often to make sure that the medicine is at a safe level. Keep a seizure diary. Write down what you remember about each seizure, especially anything that might have started it. Follow these instructions at home: Follow your seizure-control plan during  pregnancy carefully. Having seizures during pregnancy can increase your risk of having your baby early. People with epilepsy can breastfeed. Medicine may pass through your breast milk in small amounts, but it's not harmful to your baby. Let the provider know you're taking medicine to prevent seizures. Some medicines may make your baby sleepy. Keep all prenatal visits. Your provider needs to monitor your health and the baby's. Contact a health care provider if: You think you're having seizures. You don't feel the baby moving as much as you normally do. You get weakness or tiredness that isn't normal. A part of your body tingles or feels numb. You feel like you're going to faint. You have pain in your belly. You have headaches . You have trouble seeing. Get help right away if: You can't stop throwing up. You have bleeding from your vagina. You don't feel the baby moving. This information is not intended to replace advice given to you by your health care provider. Make sure you discuss any questions you have with your health care provider. Document Revised: 04/27/2023 Document Reviewed: 04/27/2023 Elsevier Patient Education  2025 [employer].

## 2023-10-24 ENCOUNTER — Ambulatory Visit: Payer: Self-pay | Admitting: Neurology

## 2023-10-26 LAB — CBC WITH DIFFERENTIAL/PLATELET
Basophils Absolute: 0 10*3/uL (ref 0.0–0.2)
Basos: 0 %
EOS (ABSOLUTE): 0 10*3/uL (ref 0.0–0.4)
Eos: 1 %
Hematocrit: 41.9 % (ref 34.0–46.6)
Hemoglobin: 13.6 g/dL (ref 11.1–15.9)
Immature Grans (Abs): 0 10*3/uL (ref 0.0–0.1)
Immature Granulocytes: 0 %
Lymphocytes Absolute: 1.6 10*3/uL (ref 0.7–3.1)
Lymphs: 52 %
MCH: 28.8 pg (ref 26.6–33.0)
MCHC: 32.5 g/dL (ref 31.5–35.7)
MCV: 89 fL (ref 79–97)
Monocytes Absolute: 0.4 10*3/uL (ref 0.1–0.9)
Monocytes: 14 %
Neutrophils Absolute: 1 10*3/uL — ABNORMAL LOW (ref 1.4–7.0)
Neutrophils: 33 %
Platelets: 245 10*3/uL (ref 150–450)
RBC: 4.72 x10E6/uL (ref 3.77–5.28)
RDW: 11.1 % — ABNORMAL LOW (ref 11.7–15.4)
WBC: 3 10*3/uL — ABNORMAL LOW (ref 3.4–10.8)

## 2023-10-26 LAB — LACOSAMIDE: Lacosamide: 3.9 ug/mL — ABNORMAL LOW (ref 5.0–10.0)

## 2023-10-30 ENCOUNTER — Ambulatory Visit: Payer: BC Managed Care – PPO | Admitting: Neurology

## 2023-11-02 ENCOUNTER — Ambulatory Visit: Payer: BC Managed Care – PPO | Admitting: Neurology

## 2023-11-23 ENCOUNTER — Other Ambulatory Visit: Payer: Self-pay

## 2023-11-27 ENCOUNTER — Other Ambulatory Visit: Payer: Self-pay | Admitting: *Deleted

## 2023-11-27 MED ORDER — LACOSAMIDE 50 MG PO TABS: ORAL_TABLET | ORAL | 1 refills | Status: DC

## 2023-11-27 NOTE — Telephone Encounter (Signed)
 Last seen on 10/23/23 Follow up scheduled 10/24/24   Dispensed Days Supply Quantity Provider Pharmacy  LACOSAMIDE  50MG      TAB 11/02/2023 90 270 each Dohmeier, Dedra, MD [facility]...     Too soon to refill, Rx was mailed out via mail order pharmacy.

## 2023-12-06 ENCOUNTER — Ambulatory Visit: Payer: 59 | Admitting: Family

## 2023-12-06 ENCOUNTER — Ambulatory Visit (INDEPENDENT_AMBULATORY_CARE_PROVIDER_SITE_OTHER): Payer: 59 | Admitting: Family Medicine

## 2023-12-06 VITALS — BP 132/88 | HR 97 | Ht 65.0 in | Wt 150.0 lb

## 2023-12-06 DIAGNOSIS — F411 Generalized anxiety disorder: Secondary | ICD-10-CM

## 2023-12-06 DIAGNOSIS — R002 Palpitations: Secondary | ICD-10-CM

## 2023-12-06 MED ORDER — HYDROXYZINE HCL 10 MG PO TABS: 10.0000 mg | ORAL_TABLET | Freq: Three times a day (TID) | ORAL | 0 refills | Status: AC | PRN

## 2023-12-06 NOTE — Progress Notes (Unsigned)
 Established Patient Office Visit  Subjective    Patient ID: Jennifer Luna, female    DOB: 1990/12/04  Age: 33 y.o. MRN: [id number]  CC:  Chief Complaint  Patient presents with   Medical Management of Chronic Issues    Pt reports she has been feeling anxiety and she has never felt that before.     HPI Jennifer Luna presents with complaint of anxiety. She reports that this is new sx. She was having palpitations and heavy breathing at work this happened about 8 weeks or so ago. She had then another similar situation about 2 weeks later. No other episodes since.   Outpatient Encounter Medications as of 12/06/2023  Medication Sig   Ascorbic Acid (VITAMIN C PO) Take 1 tablet by mouth daily.   clonazePAM  (KLONOPIN ) 0.5 MG tablet TAKE 1/2 (ONE-HALF) TABLET BY MOUTH AT BEDTIME   drospirenone-ethinyl estradiol (YAZ,GIANVI,LORYNA) 3-0.02 MG tablet Take 1 tablet by mouth daily.    ibuprofen  (ADVIL ,MOTRIN ) 600 MG tablet Take 1 tablet (600 mg total) by mouth every 6 (six) hours as needed.   lacosamide  (VIMPAT ) 50 MG TABS tablet TAKE 1 TABLET BY MOUTH ONCE DAILY IN THE MORNING AND 2 AT BEDTIME   Lactobacillus (AZO VAGINAL HEALTH PROBIOTIC) CAPS Take by mouth.   Multiple Vitamin (MULTIVITAMIN ADULT PO) Take by mouth. 1 po daily   topiramate  (TOPAMAX ) 100 MG tablet 50 mg(0.5 tablets)  in AM and 100 mg (1 whole tablet) in PM by mouth.   zonisamide  (ZONEGRAN ) 25 MG capsule Take 2 capsules (50 mg total) by mouth at bedtime.   No facility-administered encounter medications on file as of 12/06/2023.    Past Medical History:  Diagnosis Date   Amenorrhea 01/11/2012   notes from office visit, PERIMENTRUAL PAINS   Arthralgia 04/23/2013   Epilepsy (HCC)    with absence seizures, abnormal EEG   IBS (irritable bowel syndrome)    Lump in neck 08/2017   Seizures (HCC)    nx of absent and grand mal   Shortness of breath 04/23/2013   Sleepiness 01/11/2012   notes from office visit   Sleepiness     DAYTIME   Thrombocytopenia (HCC) 01/11/2012   office notes from last visit, with zarontin and depakote    Past Surgical History:  Procedure Laterality Date   shoulder injury      Family History  Problem Relation Age of Onset   Hypothyroidism Mother    Arthritis Mother    Hypertension Father    Diabetes Father    Hyperlipidemia Father    Breast cancer Maternal Grandmother    Heart disease Maternal Grandmother    Hypertension Maternal Grandmother    Heart disease Maternal Grandfather    Heart disease Paternal Grandmother    Rheum arthritis Paternal Grandmother    Asthma Sister    Lupus Maternal Aunt    Seizures Paternal Uncle        as a child   Rheum arthritis Maternal Aunt    Asthma Other        Maternal Great Uncle    Asthma Sister        had as a child    Social History   Socioeconomic History   Marital status: Single    Spouse name: Not on file   Number of children: 0   Years of education: 12   Highest education level: 12th grade  Occupational History   Occupation: Research officer, political party: UNEMPLOYED   Occupation: Consulting civil engineer  Employer: UNEMPLOYED  Tobacco Use   Smoking status: Never    Passive exposure: Never   Smokeless tobacco: Never  Vaping Use   Vaping status: Never Used  Substance and Sexual Activity   Alcohol use: No   Drug use: No   Sexual activity: Not on file  Other Topics Concern   Not on file  Social History Narrative   Patient is single and lives at home with her parents.   Patient is working part-time.   Patient has a high school education.   Patient is right-handed.   Patient drinks about 1-2 cups of hot tea daily   Social Drivers of Health   Financial Resource Strain: Low Risk  (12/05/2023)   Overall Financial Resource Strain (CARDIA)    Difficulty of Paying Living Expenses: Not very hard  Food Insecurity: No Food Insecurity (12/05/2023)   Hunger Vital Sign    Worried About Running Out of Food in the Last Year: Never true     Ran Out of Food in the Last Year: Never true  Transportation Needs: No Transportation Needs (12/05/2023)   PRAPARE - Administrator, Civil Service (Medical): No    Lack of Transportation (Non-Medical): No  Physical Activity: Inactive (12/05/2023)   Exercise Vital Sign    Days of Exercise per Week: 0 days    Minutes of Exercise per Session: Not on file  Stress: No Stress Concern Present (12/05/2023)   [employer] of Occupational Health - Occupational Stress Questionnaire    Feeling of Stress: Only a little  Social Connections: Moderately Isolated (12/05/2023)   Social Connection and Isolation Panel    Frequency of Communication with Friends and Family: More than three times a week    Frequency of Social Gatherings with Friends and Family: More than three times a week    Attends Religious Services: More than 4 times per year    Active Member of [employer] or Organizations: No    Attends Engineer, structural: Not on file    Marital Status: Never married  Intimate Partner Violence: Unknown (08/20/2021)   Received from [facility]   HITS    Physically Hurt: Not on file    Insult or Talk Down To: Not on file    Threaten Physical Harm: Not on file    Scream or Curse: Not on file    Review of Systems  Cardiovascular:  Positive for palpitations.  Psychiatric/Behavioral:  Negative for substance abuse and suicidal ideas. The patient is nervous/anxious.   All other systems reviewed and are negative.       Objective    BP 132/88   Pulse 97   Ht 5' 5 (1.651 m)   Wt 150 lb (68 kg)   LMP 11/18/2023 (Exact Date)   SpO2 98%   BMI 24.96 kg/m   Physical Exam Vitals and nursing note reviewed.  Constitutional:      General: She is not in acute distress. Cardiovascular:     Rate and Rhythm: Normal rate and regular rhythm.  Pulmonary:     Effort: Pulmonary effort is normal.     Breath sounds: Normal breath sounds.  Abdominal:     Palpations: Abdomen is soft.      Tenderness: There is no abdominal tenderness.  Neurological:     General: No focal deficit present.     Mental Status: She is alert and oriented to person, place, and time.  Psychiatric:        Mood and  Affect: Affect normal. Mood is anxious.        Speech: Speech normal.        Behavior: Behavior normal.     {Labs (Optional):23779}    Assessment & Plan:   There are no diagnoses linked to this encounter.   No follow-ups on file.   Tanda Raguel SQUIBB, MD

## 2023-12-07 ENCOUNTER — Encounter: Payer: Self-pay | Admitting: Family Medicine

## 2023-12-15 ENCOUNTER — Encounter: Payer: Self-pay | Admitting: Neurology

## 2024-01-13 ENCOUNTER — Other Ambulatory Visit: Payer: Self-pay | Admitting: Neurology

## 2024-01-16 ENCOUNTER — Other Ambulatory Visit: Payer: Self-pay

## 2024-01-16 MED ORDER — CLONAZEPAM 0.5 MG PO TABS: ORAL_TABLET | ORAL | 0 refills | Status: DC

## 2024-03-21 ENCOUNTER — Telehealth: Payer: Self-pay | Admitting: Neurology

## 2024-03-21 NOTE — Telephone Encounter (Signed)
 Appointment made, pt said she was told to call and schedule an appointment to f/u with Dr Chalice once she is engaged.

## 2024-04-22 ENCOUNTER — Other Ambulatory Visit: Payer: Self-pay | Admitting: Neurology

## 2024-04-22 DIAGNOSIS — R569 Unspecified convulsions: Secondary | ICD-10-CM

## 2024-04-24 ENCOUNTER — Encounter: Payer: Self-pay | Admitting: Family

## 2024-04-30 ENCOUNTER — Ambulatory Visit: Payer: Self-pay

## 2024-04-30 NOTE — Telephone Encounter (Signed)
 FYI Only or Action Required?: FYI only for provider: UC advised due to no appts.  Patient was last seen in primary care on 12/06/2023 by Tanda Bleacher, MD.  Called Nurse Triage reporting Hip Pain.  Symptoms began about a month ago.  Interventions attempted: Nothing.  Symptoms are: unchanged.  Triage Disposition: See PCP Within 2 Weeks  Patient/caregiver understands and will follow disposition?:   Copied from CRM #[id number]. Topic: Clinical - Red Word Triage >> Apr 30, 2024  2:52 PM Rosaria BRAVO wrote: Red Word that prompted transfer to Nurse Triage: Severe pain. Unbearable when the pain strikes  Notes from appt the pt scheduled herself (that was cancelled by the clinic):   It hurts in my Left upper thigh pelvic area if I place my foot the wrong way. Its been going on over a month and I cant remember anything tragic or anything that hit me there. Once I stepped off the scale and it hurt really bad. Another time I just got out of the bed and it hurt too Reason for Disposition  [1] MILD pain (e.g., does not interfere with normal activities) AND [2] present > 7 days  Answer Assessment - Initial Assessment Questions Has been happening on and off for 1 month. No appointments available with Dr Jaycee or in region until 1/5. Patient told to go to UC. 1. LOCATION and RADIATION: Where is the pain located? Does the pain spread (shoot) anywhere else?     Pain stays in hip. States pain is positional. 2. QUALITY: What does the pain feel like?  (e.g., sharp, dull, aching, burning)     sharp 3. SEVERITY: How bad is the pain? What does it keep you from doing?   (Scale 1-10; or mild, moderate, severe)     0 now but when pain happens it's 7-8 sometimes  4. ONSET: When did the pain start? Does it come and go, or is it there all the time?     Comes and goes. Lasts for about 5-8 minutes.  5. WORK OR EXERCISE: Has there been any recent work or exercise that involved this part of the body?       Fell playing pickleball around early November. Fell to her knees hard but states she did not have this pain at that time 6. CAUSE: What do you think is causing the hip pain?      Unsure, has not this 7. AGGRAVATING FACTORS: What makes the hip pain worse? (e.g., walking, climbing stairs, running)     When she turns leg outward  8. OTHER SYMPTOMS: Do you have any other symptoms? (e.g., back pain, pain shooting down leg,  fever, rash)     Denies  Protocols used: Hip Pain-A-AH

## 2024-05-01 NOTE — Telephone Encounter (Signed)
 noted

## 2024-05-03 ENCOUNTER — Ambulatory Visit: Payer: Self-pay

## 2024-05-03 NOTE — Telephone Encounter (Signed)
Please reach out to patient and schedule an appointment

## 2024-05-03 NOTE — Telephone Encounter (Signed)
 Hold Hydroxyzine  presently. Report to the Emergency Department/Urgent Care/call 911 for immediate medical evaluation. Follow-up with Primary Care.

## 2024-05-03 NOTE — Telephone Encounter (Signed)
 FYI Only or Action Required?: Action required by provider: request for appointment.  Patient was last seen in primary care on 12/06/2023 by Tanda Bleacher, MD.  Called Nurse Triage reporting Palpitations.  Symptoms began several days ago.  Interventions attempted: Prescription medications: Hydroxyzine .  Symptoms are: gradually worsening.  Triage Disposition: See PCP When Office is Open (Within 3 Days)  Patient/caregiver understands and will follow disposition?: Yes Reason for Disposition  Panic attacks are increasing in frequency  Answer Assessment - Initial Assessment Questions Patient reports Hydroxyzine  makes her very sleepy, brain fog. Patient reports heavy breathing occasionally when her heart rate elevates. Patient reports being engaged and planning a wedding, likely contributing to heightened stress and anxiety. Reports some stress at work, but not as much as before. No available appointments within PCP office within dispo. Please reach out to patient for appointment. [phone]. [facility] info given to patient, and advised patient to call us  if she needs anything, us  nurses are here to talk if she needs.   1. DESCRIPTION: Please describe your heart rate or heartbeat that you are having (e.g., fast/slow, regular/irregular, skipped or extra beats, palpitations)     Tachycardia, short fast breaths  2. ONSET: When did it start? (e.g., minutes, hours, days)      Yesterday  3. DURATION: How long does it last (e.g., seconds, minutes, hours)     Until takes Hydroxyzine   4. PATTERN Does it come and go, or has it been constant since it started?  Does it get worse with exertion?   Are you feeling it now?     Comes and goes  6. HEART RATE: Can you tell me your heart rate? How many beats in 15 seconds?  Note: Not all patients can do this.       Apple watch went off stating high heart rate 127 around 1:44pm and 2pm  7. RECURRENT SYMPTOM:  Have you ever had this before? If Yes, ask: When was the last time? and What happened that time?      Yes, dx with anxiety / panic attacks, given Hydroxyzine   8. CAUSE: What do you think is causing the palpitations?     Anxiety  9. CARDIAC HISTORY: Do you have any history of heart disease? (e.g., heart attack, angina, bypass surgery, angioplasty, arrhythmia)      Denies  10. OTHER SYMPTOMS: Do you have any other symptoms? (e.g., dizziness, chest pain, sweating, difficulty breathing)       Patient reports yesterday she felt a knot in her chest when it was happening, but has subsided  Protocols used: Heart Rate and Heartbeat Questions-A-AH, Anxiety and Panic Attack-A-AH  Copied from CRM #[id number]. Topic: Clinical - Red Word Triage >> May 03, 2024  2:36 PM Selinda RAMAN wrote: Red Word that prompted transfer to Nurse Triage: The patient called in stating she has been having tachycardia since yesterday along with intermittent shortness of breath. She also has been going through anxiety as she is getting married this coming March. I will transfer her to [facility]

## 2024-05-03 NOTE — Telephone Encounter (Signed)
 No acute appts available at [facility]; pt was advised to report to [facility] or ED

## 2024-05-08 ENCOUNTER — Other Ambulatory Visit: Payer: Self-pay | Admitting: *Deleted

## 2024-05-08 MED ORDER — CLONAZEPAM 0.5 MG PO TABS: ORAL_TABLET | ORAL | 0 refills | Status: DC

## 2024-05-08 NOTE — Telephone Encounter (Signed)
 Jennifer Lomax, NP Patient last seen on 10/23/23 Follow up scheduled on 10/24/24    Dispensed Days Supply Quantity Provider Pharmacy  CLONAZEPAM  0.5 MG TABLET 01/16/2024 90 45 each Luna, Amy, NP [facility] [phone] - [facility]...      Rx pending

## 2024-05-15 ENCOUNTER — Ambulatory Visit: Admitting: Neurology

## 2024-05-15 ENCOUNTER — Ambulatory Visit: Admitting: Family

## 2024-05-15 ENCOUNTER — Encounter: Payer: Self-pay | Admitting: Neurology

## 2024-05-15 VITALS — BP 131/81 | HR 110 | Ht 65.0 in | Wt 150.0 lb

## 2024-05-15 DIAGNOSIS — R569 Unspecified convulsions: Secondary | ICD-10-CM | POA: Diagnosis not present

## 2024-05-15 DIAGNOSIS — G40009 Localization-related (focal) (partial) idiopathic epilepsy and epileptic syndromes with seizures of localized onset, not intractable, without status epilepticus: Secondary | ICD-10-CM

## 2024-05-15 DIAGNOSIS — Z79899 Other long term (current) drug therapy: Secondary | ICD-10-CM | POA: Diagnosis not present

## 2024-05-15 MED ORDER — CLONAZEPAM 0.5 MG PO TABS: ORAL_TABLET | ORAL | 2 refills | Status: AC

## 2024-05-15 MED ORDER — LACOSAMIDE 50 MG PO TABS: ORAL_TABLET | ORAL | 3 refills | Status: AC

## 2024-05-15 MED ORDER — ZONISAMIDE 25 MG PO CAPS: 25.0000 mg | ORAL_CAPSULE | Freq: Every day | ORAL | 1 refills | Status: DC

## 2024-05-15 NOTE — Addendum Note (Signed)
 Addended by: CHALICE SAUNAS on: 05/15/2024 11:59 AM   Modules accepted: Orders

## 2024-05-15 NOTE — Progress Notes (Signed)
 "        Provider:  Dedra Gores, MD  Primary Care Physician:  Jaycee Greig PARAS, NP [address] [place] [address]     Referring Provider: Jaycee Greig PARAS, Np [address] [place],  [place] [address]          Chief Complaint according to patient   Patient presents with:                HISTORY OF PRESENT ILLNESS:  Jennifer Luna is a 33 y.o. female patient who is here for revisit 05/15/2024 for  a discussion of seizure treatments and  wishes to clarify medication  risks and benefits in a possible pregnancy, she is planning her wedding on March 21st 2026, she works in [employer] at [employer].   This patient has been on anti-epileptic medication since age 71.       Chief concern according to patient :   Zonisamide  , Vimpat , Topiramate , Klonopin   VIMPAT  is considered safe for pregnancy, while  topiramate  and zonisamide  are not.   Start  prenatal vitamins now!   Zonisamide  should be reduced by 50% now, ( 25 mg night ly from 50 mg nightly ,  and  in March will be d/c by the time you have married.   Topiramate  will be discussed again in next visit March 2026- plan is to reduce to 100 mg or less daily. Could be weaned off.   Vimpat  is  increased  from 50 mg -0-100 mg  , to morning and bedtime 100 mg po. [name]   Keppra can be added.           Fam Hx : see previous note  Social HX; see previous note       Review of Systems: Out of a complete 14 system review, the patient complains of only the following symptoms, and all other reviewed systems are negative.:   SLEEPINESS ?  How likely are you to doze in the following situations: 0 = not likely, 1 = slight chance, 2 = moderate chance, 3 = high chance  Sitting and Reading? Watching Television? Sitting inactive in a public place (theater or meeting)? Lying down in the afternoon when circumstances permit? Sitting and talking to someone? Sitting quietly after lunch without  alcohol? In a car, while stopped for a few minutes in traffic? As a passenger in a car for an hour without a break?  Total =        Social History   Socioeconomic History   Marital status: Single    Spouse name: Not on file   Number of children: 0   Years of education: 12   Highest education level: 12th grade  Occupational History   Occupation: Research Officer, Political Party: UNEMPLOYED   Occupation: Dentist: UNEMPLOYED  Tobacco Use   Smoking status: Never    Passive exposure: Never   Smokeless tobacco: Never  Vaping Use   Vaping status: Never Used  Substance and Sexual Activity   Alcohol use: No   Drug use: No   Sexual activity: Not on file  Other Topics Concern   Not on file  Social History Narrative   Patient is single and lives at home with her parents.   Patient is working part-time.   Patient has a high school education.   Patient is right-handed.   Patient drinks about 1-2 cups of hot tea daily   Social  Drivers of Health   Tobacco Use: Low Risk (05/15/2024)   Patient History    Smoking Tobacco Use: Never    Smokeless Tobacco Use: Never    Passive Exposure: Never  Financial Resource Strain: Low Risk (12/05/2023)   Overall Financial Resource Strain (CARDIA)    Difficulty of Paying Living Expenses: Not very hard  Food Insecurity: No Food Insecurity (12/05/2023)   Epic    Worried About Radiation Protection Practitioner of Food in the Last Year: Never true    Ran Out of Food in the Last Year: Never true  Transportation Needs: No Transportation Needs (12/05/2023)   Epic    Lack of Transportation (Medical): No    Lack of Transportation (Non-Medical): No  Physical Activity: Inactive (12/05/2023)   Exercise Vital Sign    Days of Exercise per Week: 0 days    Minutes of Exercise per Session: Not on file  Stress: No Stress Concern Present (12/05/2023)   [employer] of Occupational Health - Occupational Stress Questionnaire    Feeling of Stress: Only a little  Social  Connections: Moderately Isolated (12/05/2023)   Social Connection and Isolation Panel    Frequency of Communication with Friends and Family: More than three times a week    Frequency of Social Gatherings with Friends and Family: More than three times a week    Attends Religious Services: More than 4 times per year    Active Member of Clubs or Organizations: No    Attends Banker Meetings: Not on file    Marital Status: Never married  Depression (PHQ2-9): Low Risk (06/08/2023)   Depression (PHQ2-9)    PHQ-2 Score: 0  Alcohol Screen: Low Risk (12/05/2023)   Alcohol Screen    Last Alcohol Screening Score (AUDIT): 1  Housing: Low Risk (12/05/2023)   Epic    Unable to Pay for Housing in the Last Year: No    Number of Times Moved in the Last Year: 0    Homeless in the Last Year: No  Utilities: Not on file  Health Literacy: Not on file    Family History  Problem Relation Age of Onset   Hypothyroidism Mother    Arthritis Mother    Hypertension Father    Diabetes Father    Hyperlipidemia Father    Breast cancer Maternal Grandmother    Heart disease Maternal Grandmother    Hypertension Maternal Grandmother    Heart disease Maternal Grandfather    Heart disease Paternal Grandmother    Rheum arthritis Paternal Grandmother    Asthma Sister    Lupus Maternal Aunt    Seizures Paternal Uncle        as a child   Rheum arthritis Maternal Aunt    Asthma Other        Maternal Great Uncle    Asthma Sister        had as a child    Past Medical History:  Diagnosis Date   Amenorrhea 01/11/2012   notes from office visit, [facility] PAINS   Arthralgia 04/23/2013   Epilepsy (HCC)    with absence seizures, abnormal EEG   IBS (irritable bowel syndrome)    Lump in neck 08/2017   Seizures (HCC)    nx of absent and grand mal   Shortness of breath 04/23/2013   Sleepiness 01/11/2012   notes from office visit   Sleepiness    DAYTIME   Thrombocytopenia 01/11/2012   office notes  from last visit, with zarontin and depakote  Past Surgical History:  Procedure Laterality Date   shoulder injury       Medications Ordered Prior to Encounter[1]  Allergies[2]   DIAGNOSTIC DATA (LABS, IMAGING, TESTING) - I reviewed patient records, labs, notes, testing and imaging myself where available.  Lab Results  Component Value Date   WBC 3.0 (L) 10/23/2023   HGB 13.6 10/23/2023   HCT 41.9 10/23/2023   MCV 89 10/23/2023   PLT 245 10/23/2023      Component Value Date/Time   NA 141 05/01/2023 1134   K 4.0 05/01/2023 1134   CL 108 (H) 05/01/2023 1134   CO2 17 (L) 05/01/2023 1134   GLUCOSE 91 05/01/2023 1134   GLUCOSE 102 (H) 12/14/2017 1614   BUN 14 05/01/2023 1134   CREATININE 0.82 05/01/2023 1134   CREATININE 0.74 04/24/2013 1605   CALCIUM 9.3 05/01/2023 1134   PROT 7.5 05/01/2023 1134   ALBUMIN 4.9 05/01/2023 1134   AST 21 05/01/2023 1134   ALT 26 05/01/2023 1134   ALKPHOS 57 05/01/2023 1134   BILITOT 0.2 05/01/2023 1134   GFRNONAA 101 05/12/2020 1118   GFRAA 117 05/12/2020 1118   Lab Results  Component Value Date   CHOL 163 05/13/2022   HDL 63 05/13/2022   LDLCALC 88 05/13/2022   TRIG 59 05/13/2022   CHOLHDL 2.6 05/13/2022   Lab Results  Component Value Date   HGBA1C 5.6 05/13/2022   Lab Results  Component Value Date   VITAMINB12 911 04/24/2013   Lab Results  Component Value Date   TSH 0.570 05/13/2022    PHYSICAL EXAM:  Vitals:   05/15/24 1055  BP: 131/81  Pulse: (!) 110  SpO2: 99%   No data found. Body mass index is 24.96 kg/m.   Wt Readings from Last 3 Encounters:  05/15/24 150 lb (68 kg)  12/06/23 150 lb (68 kg)  10/23/23 149 lb (67.6 kg)     Ht Readings from Last 3 Encounters:  05/15/24 5' 5 (1.651 m)  12/06/23 5' 5 (1.651 m)  10/23/23 5' 5 (1.651 m)      General: The patient is awake, alert and appears not in acute distress and groomed. Head: Normocephalic, atraumatic.  Neck is supple.    NEUROLOGIC  EXAM: The patient is awake and alert, oriented to place and time.   Memory subjective described as intact.  Attention span & concentration ability appears normal.   Speech is fluent,  without  dysarthria, dysphonia or aphasia.  Mood and affect are appropriate.   Neurological Examination: Mental Status: Intact. Language and speech are normal. No cognitive deficits. Cranial Nerves II-XII: Intact. PERL. EOMI. VFF. No nystagmus.  No facial droop.  No ptosis.  Hearing is grossly intact bilaterally.  The tongue is normal and midline. Motor: Strengths are 5/5 throughout. Muscle bulk and tone are normal. No tremors.  Coordination: No ataxia or dysmetria.  Sensory: Grossly intact throughout to all modalities. Reflexes: Normal and symmetric throughout. No ankle clonus. Babinski's sign is absent bilaterally. Hoffman's sign is absent bilaterally. Gait and Station: Normal. Romberg's sign is absent.   ASSESSMENT AND PLAN :   33 y.o. year old female  here with:    Epilepsy, therapy consideration for future pregnancy.   Jennifer Luna is a 33 y.o. female patient who is here for revisit 05/15/2024 for  a discussion of seizure treatments and  wishes to clarify medication  risks and benefits in a possible pregnancy, she is planning her wedding on March 21st 2026, she  works in [employer] at [employer].   This patient has been on anti-epileptic medication since age 13.    Chief concern according to patient :   Zonisamide  , Vimpat , Topiramate , Klonopin   VIMPAT  is considered safe for pregnancy, while  topiramate  and zonisamide  are not.   Start  prenatal vitamins now!   Zonisamide  should be reduced by 50% now, ( 25 mg night ly from 50 mg nightly ,  and  in March will be d/c by the time you have married.   Topiramate  will be discussed again in next visit March 2026- plan is to reduce to 100 mg or less daily. Could be weaned off.   Vimpat  is  increased  from 50 mg -0-100 mg  , to morning and  bedtime 100 mg po. [name]   Keppra can be added.       I would like to thank  Jaycee Greig PARAS, Np [address] [place],  [place] [address] for allowing me to meet with this pleasant patient.   Electronically signed by: Dedra Gores, MD 05/15/2024 11:22 AM  [facility] Board certified by The [employer] of Sleep Medicine and Diplomate of the [employer] of Sleep Medicine. Board certified In Neurology through the ABPN, Fellow of the [employer] of Neurology.      [1]  Current Outpatient Medications on File Prior to Visit  Medication Sig Dispense Refill   Ascorbic Acid (VITAMIN C PO) Take 1 tablet by mouth daily.     clonazePAM  (KLONOPIN ) 0.5 MG tablet TAKE 1/2 (ONE-HALF) TABLET BY MOUTH AT BEDTIME 45 tablet 0   drospirenone-ethinyl estradiol (YAZ,GIANVI,LORYNA) 3-0.02 MG tablet Take 1 tablet by mouth daily.   12   hydrOXYzine  (ATARAX ) 10 MG tablet Take 1 tablet (10 mg total) by mouth 3 (three) times daily as needed. 30 tablet 0   ibuprofen  (ADVIL ,MOTRIN ) 600 MG tablet Take 1 tablet (600 mg total) by mouth every 6 (six) hours as needed. 30 tablet 0   lacosamide  (VIMPAT ) 50 MG TABS tablet TAKE 1 TABLET BY MOUTH ONCE DAILY IN THE MORNING AND 2 AT BEDTIME 270 tablet 1   Multiple Vitamin (MULTIVITAMIN ADULT PO) Take by mouth. 1 po daily     topiramate  (TOPAMAX ) 100 MG tablet 50 mg(0.5 tablets)  in AM and 100 mg (1 whole tablet) in PM by mouth. 270 tablet 1   zonisamide  (ZONEGRAN ) 25 MG capsule TAKE 2 CAPSULES (50MG       TOTAL) AT BEDTIME 60 capsule 0   Lactobacillus (AZO VAGINAL HEALTH PROBIOTIC) CAPS Take by mouth. (Patient not taking: Reported on 05/15/2024)     No current facility-administered medications on file prior to visit.  [2]  Allergies Allergen Reactions   Penicillins Rash    Has patient had a PCN reaction causing immediate rash, facial/tongue/throat swelling, SOB or lightheadedness with hypotension: No  Has  patient had a PCN reaction causing severe rash involving mucus membranes or skin necrosis: Yes  Has patient had a PCN reaction that required hospitalization No  Has patient had a PCN reaction occurring within the last 10 years: No  If all of the above answers are NO, then may proceed with Cephalosporin use.   Sulfa Antibiotics Nausea Only, Rash and Other (See Comments)   Penicillin G Nausea And Vomiting   Erythromycin Rash and Nausea And Vomiting   "

## 2024-05-15 NOTE — Patient Instructions (Addendum)
 Revisit for March , early March  2026.   Starting now to reduce Zonisamide  by 50% , and will wean off completely by March 1 st.   In march will discuss how to best reduce Topiramate .  Start with prenatal vitamins early - now is the time.   Jennifer Luna is a 33 y.o. female patient who is here for revisit 05/15/2024 for  a discussion of seizure treatments and  wishes to clarify medication  risks and benefits in a possible pregnancy, she is planning her wedding on March 21st 2026, she works in [employer] at [employer].   This patient has been on anti-epileptic medication since age 38.       Chief concern according to patient :   Zonisamide  , Vimpat , Topiramate , Klonopin   VIMPAT  is considered safe for pregnancy, while  topiramate  and zonisamide  are not.   Start  prenatal vitamins now!   Zonisamide  should be reduced by 50% now, ( 25 mg night ly from 50 mg nightly ,  and  in March will be d/c by the time you have married.   Topiramate  will be discussed again in next visit March 2026- plan is to reduce to 100 mg or less daily. Could be weaned off.   Vimpat  is  increased  from 50 mg -0-100 mg  , to morning and bedtime 100 mg po. Jennifer Luna   Keppra can be added.      Enter [place]  General  Pregnancy registration.   Malformation Risk: Data on major congenital malformations [facility]) are limited, but one study indicated a rate of 13% for monotherapy and 6.9% for polytherapy, with reported issues including ventricular septal defects and spina bifida. However, another analysis showed a lower, 1.3% risk of major malformations. Management: Do not stop taking zonisamide  suddenly if you become pregnant, as this can trigger dangerous seizures. Consult a specialist immediately to review treatment. Contraception: Women of childbearing potential should use effective contraception while taking zonisamide  and for at least one month after stopping. Dosage Changes: Zonisamide  levels in the body can  drop by more than 40% during pregnancy, requiring close monitoring by a doctor. Zonisamide  should generally be avoided during pregnancy unless no suitable alternative exists, as animal studies indicate potential fetal harm, including risks of low birth weight, small for gestational age, and metabolic acidosis. While data is limited, some studies suggest a 1.3%-13% rate of major congenital malformations. If taken, close monitoring for metabolic acidosis and structural abnormalities is essential.   Vimpat  (lacosamide ) use in pregnancy shows promising, generally safe outcomes with most pregnancies resulting in live births and no major birth defects, though data is still limited; it's considered a newer option with lower major malformation risks than older drugs like valproate, but potential neonatal issues (low Apgar, feeding problems, sedation) are noted, requiring careful monitoring and personalized discussion with a doctor about balancing seizure control with fetal safety.   Topiramate  (brand name Topamax ) use during pregnancy is associated with a significantly increased risk of birth defects and developmental problems. Due to these risks, healthcare authorities strongly advise against its use in women who are pregnant or may become pregnant, especially for conditions like migraine prophylaxis.  Key Risks and Considerations Birth Defects: Taking topiramate  during the first trimester increases the risk of major congenital malformations, most notably oral clefts (cleft lip and/or palate). The risk increases with higher doses of the medication. Neurodevelopmental Issues: Studies suggest an increased risk of neurodevelopmental disorders in children exposed in the womb, including autism spectrum  disorder (ASD), intellectual disability, and attention deficit hyperactivity disorder (ADHD). Fetal Growth Restriction: Exposure to topiramate  in the womb is associated with a higher likelihood of the baby being born  with a low birth weight or being small for gestational age. Dose-Dependent Risk: The risks of both malformations and neurodevelopmental issues appear to be dose-related, with higher daily doses carrying a greater risk.  Recommendations for Women of Childbearing Potential Due to these serious risks, strict safety measures and a pregnancy prevention program are in place for women who can become pregnant and are taking topiramate .    Prenatal Multivitamin Oral Dosage Forms What is this medication? PRENATAL MULTIVITAMINS (pree NAY tuhl MUHL tee VAHY tuh mins) support your overall health. They work by increasing the amount of vitamins, minerals, and other nutrients in your body. They are used in combination with a healthy diet. This medicine may be used for other purposes; ask your health care provider or pharmacist if you have questions. COMMON BRAND NAME(S): BP FoliNatal Plus B, BP MultiNatal Plus, BP MultiNatal Plus Chewable, BP Prenate, Calcium PNV, CareNatal DHA, Cavan Prenatal with EC Calcium, Cavan-Alpha, Cavan-EC SOD DHA, Cavan-Heme OB, Cavan-Heme Omega, Centrum Specialist Prenatal, CertaVite with Antioxidants, Choice-OB + DHA, Citracal Prenatal + DHA, CitraNatal 90 DHA, CitraNatal Assure, CitraNatal DHA, CitraNatal Essence DHA, CitraNatal Harmony, ComBi Rx, Complete Natal DHA, Concept DHA, CoreNate-DHA, CRNatal DHA, Duet, Duet Chewable, Duet DHA, Duet DHA 400, Duet DHA 430ec, Duet DHA Balanced, Duet DHA Complete, Duet DHA EC, Duet DHA Ferrazone, EC Omega-3, Elite OB with DHA, Femecal OB Plus DHA, Focalgin 90 DHA, Focalgin CA, Folbecal, Folivane-EC Calcium DHA NF, Foltabs 90 Plus DHA, Foltabs Prenatal, Foltabs Prenatal Plus DHA, Gesticare, Gesticare DHA, Gesticare DHA Delayed-Release, HemeNatal OB + DHA, HIP Prenatal, iNatal Advance, Infanate Balance, Infanate DHA, Infanate Plus, Integra Plus, Kolnatal, Lactocal-F, Levomefolate PNV, MACNATAL CN DHA, Marnatal-F Plus, Maxinate, Mom's Choice Rx, Mteryti,  Mteryti Folic, Multi-Nate 30, Multi-Nate 30 DHA, Multi-Nate DHA Extra, Multifol Plus, NataChew, NataFolic-OB, Natal-V RX, NatalCare PIC Forte, NatalCare Plus, NATALVIRT 90 DHA, NATALVIRT CA, Natatab Rx, Natelle C, Natelle One, Natelle Plus with DHA, Navatab + DHA, Neevo, Nestabs ABC, Nestabs DHA, Niva-Plus, NovaNatal, Nutri-Tab OB + DHA, O-Cal Prenatal, OB Complete Petite, OB Complete Premier, OB Complete with DHA, Obtrex DHA, One-A-Day Women's, One-A-Day Women's Prenatal, Paire OB Tablet Plus DHA, PNV OB + DHA, PNV Prenatal, PNV Prenatal Plus Multivitamin, PNV-DHA, PNV-DHA Plus, PNV-First, PNV-Iron, PNV-OB with DHA, PNV-Select, PNV: Ferrous Fumerate/Docusate/Folic Acid, PR Natal 400, PR Natal 400ec, PR [address] 430, PR [address] 430ec, PR Natal 440ec, PreCare, [address], PreferaOB, PreferaOB + DHA, PreferaOB One, Premesis Rx, Vander, [address], Prena1 Plus, [employer], Prenaissance 90 DHA, Prenaissance Balance, Prenaissance DHA, Prenaissance Harmony DHA, Prenaissance Plus, Prenaissance Promise, PrenaPlus, Prenatabs OBN, Prenatabs RX, Prenatal, Prenatal 19, Prenatal AD, Prenatal Low Iron, Prenatal Multivitamin + DHA 2, Prenatal Plus, Prenatal Plus Iron, Prenatal Plus Low Iron, Prenatal Vitamin, PreNatal Vitamins Plus, Prenate Advance, Prenate DHA, Prenate Elite, Prenate Mini, PreNate Plus, Prenate Restore with Quatrefolic, PrePLUS, Previte Rx, PruEt DHA, PruEt DHAec, PureVit DualFe Plus, RE OB + DHA, RE OB 90 + DHA, RE PreVit + DHA, RE-Nata 29, RE-Nata 29 OB, Reaphrim, Renate, Renate DHA, Renate DHA Extra, Rovin-Nv, Rovin-Nv DHA, Se-Care, Se-Care Conceive, Se-Care Gesture, Se-Natal 19, Se-Natal 90, Se-Natal ONE, Se-Plete DHA, Select-OB + DHA, SetonET, SetonET-EC DHA, Similac Prenatal, StrongStart, Stuart Prenatal + DHA, Taron A Prenatal Pack with DHA, Taron EC Calcium DHA Pack, Taron Prenatal with DHA, Taron-EC Cal, Thera Nike, [place], [place],  TL-Care DHA, Tri Rx, Trifera OB, Trimesis Rx, TriStart  DHA, Triveen-Duo DHA, Trust [employer], UpSpring Prenatal+, Vena-Bal DHA, Verotin-GR, Vinate C, Vinate Care, Vinate II, Vinate III, Virt-Nate, Virt-PN, Virt-PN DHA, vita True, Vitafol PN, Vitafol Prenatal with Iron, Vitafol Ultra, Vitafol-Nano Prenatal, Vitafol-OB, Vitafol-OB + DHA, Vitafol-OB and DHA, Vitafol-One, VitaMed MD Plus Rx, VitaMedMD One Rx with Quatrefolic, VitaNatal OB Plus DHA, vitaPearl Prenatal, VitaPhil + DHA, VitaPhil + DHA 90, Vol-Plus, Vol-Tab Rx, VP CH Ultra, VP-CH Plus, VP-CH-PNV, VP-HEME OB+ DHA, VP-HEME One, VP-PNV-DHA, WesCap-PN, Zatean-CH, Zatean-Pn, Zatean-Pn DHA What should I tell my care team before I take this medication? They need to know if you have any of these conditions: Bleeding or clotting disorder History of anemia of any type Other chronic health condition An unusual or allergic reaction to vitamins, minerals, other medications, foods, dyes, or preservatives How should I use this medication? Take this medication by mouth with water. Take it as directed on the label at the same time every day. You can take it with or without food. If it upsets your stomach, take it with food. Do not use it more often than directed. Talk to your care team about the use of this medication in children. Special care may be needed. Overdosage: If you think you have taken too much of this medicine contact a poison control center or emergency room at once. NOTE: This medicine is only for you. Do not share this medicine with others. What if I miss a dose? If you miss a dose, take it as soon as you can. If it is almost time for your next dose, take only that dose. Do not take double or extra doses. What may interact with this medication? Alendronate Antacids Cefdinir Cefditoren Etidronate Fluoroquinolone antibiotics, such as ciprofloxacin , gatifloxacin, levofloxacin  Ibandronate Levodopa Risedronate Tetracycline antibiotics, such as doxycycline , minocycline, tetracycline Thyroid   hormones Warfarin This list may not describe all possible interactions. Give your health care provider a list of all the medicines, herbs, non-prescription drugs, or dietary supplements you use. Also tell them if you smoke, drink alcohol, or use illegal drugs. Some items may interact with your medicine. What should I watch for while using this medication? See your care team for regular checks on your progress. Remember that vitamin and mineral supplements do not replace the need for good nutrition from a balanced diet. Stools commonly change color when vitamins and minerals are taken. Notify your care team if this change is alarming or accompanied by other symptoms, like abdominal pain. What side effects may I notice from receiving this medication? Side effects that you should report to your care team as soon as possible: Allergic reactions--skin rash, itching, hives, swelling of the face, lips, tongue, or throat This list may not describe all possible side effects. Call your doctor for medical advice about side effects. You may report side effects to FDA at 1-800-FDA-1088. Where should I keep my medication? Keep out of the reach of children. Most vitamins and minerals should be stored at controlled room temperature. Check your specific product directions. Protect from heat and moisture. Throw away any unused medication after the expiration date. NOTE: This sheet is a summary. It may not cover all possible information. If you have questions about this medicine, talk to your doctor, pharmacist, or health care provider.  2025 Elsevier/Gold Standard (2022-11-03 00:00:00)

## 2024-05-17 ENCOUNTER — Encounter: Payer: Self-pay | Admitting: Neurology

## 2024-05-18 LAB — ZONISAMIDE LEVEL: Zonisamide: <2 ug/mL — ABNORMAL LOW (ref 10.0–40.0)

## 2024-05-18 LAB — LACOSAMIDE: Lacosamide: 0.9 ug/mL — ABNORMAL LOW (ref 5.0–10.0)

## 2024-05-19 ENCOUNTER — Other Ambulatory Visit: Payer: Self-pay | Admitting: Neurology

## 2024-05-19 DIAGNOSIS — R569 Unspecified convulsions: Secondary | ICD-10-CM

## 2024-05-21 ENCOUNTER — Ambulatory Visit: Payer: Self-pay | Admitting: Neurology

## 2024-05-22 NOTE — Progress Notes (Signed)
 Please refer to Dohmeier, Dedra, MD at [facility] regarding patient's question/concern due to labs were collected from the same provider of which the patient is referencing.

## 2024-05-27 ENCOUNTER — Telehealth: Payer: Self-pay | Admitting: Family

## 2024-05-27 NOTE — Telephone Encounter (Signed)
 Copied from CRM [id number]. Topic: General - Call Back - No Documentation >> May 27, 2024 11:10 AM Vena HERO wrote: Reason for CRM: Pt returning phone call from 30 mins ago, no vm left. She states she is at work until 5 pm but if you call her again please leave a detailed message.

## 2024-05-30 NOTE — Progress Notes (Signed)
 [name]

## 2024-06-06 ENCOUNTER — Encounter: Payer: Self-pay | Admitting: Neurology

## 2024-06-07 ENCOUNTER — Encounter: Payer: Self-pay | Admitting: Podiatry

## 2024-06-07 ENCOUNTER — Other Ambulatory Visit: Payer: Self-pay | Admitting: Podiatry

## 2024-06-07 ENCOUNTER — Ambulatory Visit: Admitting: Podiatry

## 2024-06-07 DIAGNOSIS — M79675 Pain in left toe(s): Secondary | ICD-10-CM

## 2024-06-07 DIAGNOSIS — M79674 Pain in right toe(s): Secondary | ICD-10-CM

## 2024-06-07 DIAGNOSIS — B351 Tinea unguium: Secondary | ICD-10-CM

## 2024-06-07 MED ORDER — TERBINAFINE HCL 250 MG PO TABS: 250.0000 mg | ORAL_TABLET | Freq: Every day | ORAL | 1 refills | Status: AC

## 2024-06-07 NOTE — Progress Notes (Signed)
" ° °  Subjective:    HPI Presents with complaint of thick painful nails that causes discomfort with  walking and wearing shoes.  Have been this way for many years and have been getting worse.   Objective:  Physical Exam   General: AAO x3, NAD  Vascular: DP and PT pulses palpable bilaterally.  Immedate capillary fill time digits. No significant lower extremity edema bilaterally.  Dermatological:  Onychomycotic mycotic changes nails 1 through 5 with discoloration nail and subungual debris and thickening of the nail with redness along the nail folds.  Tenderness with pressure on nail plates..  Neruologic:  Grossly intact B/L  Musculoskeletal:  Normal lower extremity muscle strength.  Assessment:  Painful onychomycotic nails 1 through 5 bilaterally. Pain feet b/l     Plan:  - New office visit level 3 for evaluation and management -Discussed with with patient onychomycosis and etiology and treatment.  Discussed topical versus oral agents.  Discussed risk and benefits of both.  No history of hepatic or renal disease.  Patient would like to start oral Lamisil  treatment.  Explained that we would check labs every 6 weeks during course of treatment to monitor for any side effects. -Rx: Lamisil  250 mg p.o. daily, refill x 1 -Labs ordered today for liver function tests to monitor for any hepatic side effects from the Lamisil .  Return 6 weeks Lamisil  3  "

## 2024-06-08 LAB — HEPATIC FUNCTION PANEL
ALT: 20 [IU]/L (ref 0–32)
AST: 15 [IU]/L (ref 0–40)
Albumin: 4.1 g/dL (ref 3.9–4.9)
Alkaline Phosphatase: 89 [IU]/L (ref 41–116)
Bilirubin Total: 0.3 mg/dL (ref 0.0–1.2)
Bilirubin, Direct: 0.13 mg/dL (ref 0.00–0.40)
Total Protein: 6.1 g/dL (ref 6.0–8.5)

## 2024-06-19 ENCOUNTER — Other Ambulatory Visit: Payer: Self-pay | Admitting: Neurology

## 2024-06-19 DIAGNOSIS — R569 Unspecified convulsions: Secondary | ICD-10-CM

## 2024-07-16 ENCOUNTER — Ambulatory Visit: Admitting: Neurology

## 2024-07-19 ENCOUNTER — Ambulatory Visit: Admitting: Podiatry

## 2024-10-24 ENCOUNTER — Ambulatory Visit: Admitting: Family Medicine

## 2024-10-24 ENCOUNTER — Ambulatory Visit: Admitting: Neurology
